# Patient Record
Sex: Male | Born: 1965
Health system: Southern US, Community
[De-identification: ages and names within clinical notes are randomized; demographics above are authoritative.]

## PROBLEM LIST (undated history)

## (undated) ENCOUNTER — Emergency Department (HOSPITAL_COMMUNITY): Disposition: A | Discharge status: Home / Self Care | Payer: Self-pay

## (undated) DIAGNOSIS — J40 Bronchitis, not specified as acute or chronic: Secondary | ICD-10-CM

## (undated) DIAGNOSIS — G43909 Migraine, unspecified, not intractable, without status migrainosus: Secondary | ICD-10-CM

## (undated) DIAGNOSIS — J329 Chronic sinusitis, unspecified: Secondary | ICD-10-CM

## (undated) DIAGNOSIS — T7840XA Allergy, unspecified, initial encounter: Secondary | ICD-10-CM

## (undated) DIAGNOSIS — E119 Type 2 diabetes mellitus without complications: Secondary | ICD-10-CM

## (undated) HISTORY — PX: MOUTH SURGERY: SHX715

## (undated) HISTORY — DX: Allergy, unspecified, initial encounter: T78.40XA

## (undated) HISTORY — PX: NASAL POLYP SURGERY: SHX186

## (undated) HISTORY — DX: Migraine, unspecified, not intractable, without status migrainosus: G43.909

## (undated) HISTORY — DX: Type 2 diabetes mellitus without complications: E11.9

---

## 2012-09-22 ENCOUNTER — Emergency Department (INDEPENDENT_AMBULATORY_CARE_PROVIDER_SITE_OTHER)
Admission: EM | Admit: 2012-09-22 | Discharge: 2012-09-22 | Disposition: A | Discharge status: Home / Self Care | Payer: No Typology Code available for payment source | Source: Home / Self Care | Attending: Emergency Medicine | Admitting: Emergency Medicine

## 2012-09-22 ENCOUNTER — Encounter (HOSPITAL_COMMUNITY): Payer: Self-pay | Admitting: Emergency Medicine

## 2012-09-22 DIAGNOSIS — H6120 Impacted cerumen, unspecified ear: Secondary | ICD-10-CM

## 2012-09-22 DIAGNOSIS — J329 Chronic sinusitis, unspecified: Secondary | ICD-10-CM

## 2012-09-22 DIAGNOSIS — J310 Chronic rhinitis: Secondary | ICD-10-CM

## 2012-09-22 DIAGNOSIS — H6123 Impacted cerumen, bilateral: Secondary | ICD-10-CM

## 2012-09-22 MED ORDER — DOXYCYCLINE HYCLATE 100 MG PO CAPS: 100.0000 mg | ORAL_CAPSULE | Freq: Two times a day (BID) | ORAL | Status: DC

## 2012-09-22 MED ORDER — FEXOFENADINE-PSEUDOEPHED ER 60-120 MG PO TB12: 1.0000 | ORAL_TABLET | Freq: Two times a day (BID) | ORAL | Status: AC

## 2012-09-22 MED ORDER — FLUTICASONE PROPIONATE 50 MCG/ACT NA SUSP: 2.0000 | Freq: Every day | NASAL | Status: DC

## 2012-09-22 MED ORDER — FEXOFENADINE-PSEUDOEPHED ER 60-120 MG PO TB12: 1.0000 | ORAL_TABLET | Freq: Two times a day (BID) | ORAL | Status: DC

## 2012-09-22 NOTE — ED Provider Notes (Signed)
History     CSN: [id number]  Arrival date & time 09/22/12  1435   First MD Initiated Contact with Patient 09/22/12 [phone]      Chief Complaint  Patient presents with  . URI    (Consider location/radiation/quality/duration/timing/severity/associated sxs/prior treatment) HPI Comments: Patient presents to urgent care with ongoing upper respiratory congestion that he describes as pain discomfort pressure, mainly from his left maxillary sinus and left nose. He been having problems to breathe through his left nostril. He continues to have a productive cough and a runny nose. He feels his right ears clogged up. Patient went to another urgent care Center in February and to 10 days of amoxicillin and has been taking Claritin-D he feels that his sinus pressure and discomfort he had at the time improve after the antibiotic course but seemed to have returned. Patient denies any fevers, shortness of breath or difficulty swallowing. Denies any vertigo or tinnitus. Does have the perception of a foul smell from his nose.  Patient is a 47 y.o. male presenting with URI. The history is provided by the patient.  URI Presenting symptoms: congestion, cough, ear pain and rhinorrhea   Presenting symptoms: no fatigue, no fever and no sore throat   Severity:  Moderate Onset quality:  Gradual Duration:  6 weeks Timing:  Constant Chronicity:  Recurrent Relieved by:  Breathing Worsened by:  Nothing tried Associated symptoms: no arthralgias, no headaches, no neck pain, no sinus pain, no sneezing, no swollen glands and no wheezing   Risk factors: recent illness   Risk factors: no immunosuppression, no recent travel and no sick contacts     History reviewed. No pertinent past medical history.  History reviewed. No pertinent past surgical history.  History reviewed. No pertinent family history.  History  Substance Use Topics  . Smoking status: Never Smoker   . Smokeless tobacco: Not on file  . Alcohol Use:  No      Review of Systems  Constitutional: Negative for fever, chills, diaphoresis, activity change, appetite change and fatigue.  HENT: Positive for ear pain, congestion, rhinorrhea and sinus pressure. Negative for hearing loss, sore throat, sneezing, drooling, neck pain, neck stiffness, postnasal drip, tinnitus and ear discharge.   Respiratory: Positive for cough. Negative for chest tightness, shortness of breath, wheezing and stridor.   Musculoskeletal: Negative for arthralgias.  Skin: Negative for color change and rash.  Neurological: Negative for headaches.    Allergies  Review of patient's allergies indicates no known allergies.  Home Medications   Current Outpatient Rx  Name  Route  Sig  Dispense  Refill  . doxycycline (VIBRAMYCIN) 100 MG capsule   Oral   Take 1 capsule (100 mg total) by mouth 2 (two) times daily.   20 capsule   0   . fexofenadine-pseudoephedrine (ALLEGRA-D) 60-120 MG per tablet   Oral   Take 1 tablet by mouth 2 (two) times daily.   30 tablet   0   . fluticasone (FLONASE) 50 MCG/ACT nasal spray   Nasal   Place 2 sprays into the nose daily.   16 g   2     BP 133/71  Pulse 90  Temp(Src) 98.2 F (36.8 C) (Oral)  Resp 20  SpO2 98%  Physical Exam  Vitals reviewed. Constitutional: Vital signs are normal. He appears well-developed and well-nourished.  Non-toxic appearance. He does not have a sickly appearance. He does not appear ill. No distress.  HENT:  Head: Normocephalic.  Right Ear: Ear canal normal.  No drainage, swelling or tenderness. No mastoid tenderness. No decreased hearing is noted.  Left Ear: Ear canal normal. No drainage, swelling or tenderness. No mastoid tenderness. No decreased hearing is noted.  Ears:  Nose: Mucosal edema, rhinorrhea, sinus tenderness and septal deviation present. No nasal deformity or nasal septal hematoma. No epistaxis.  No foreign bodies. Right sinus exhibits no maxillary sinus tenderness and no frontal  sinus tenderness. Left sinus exhibits no maxillary sinus tenderness and no frontal sinus tenderness.    Eyes: Conjunctivae are normal. Right eye exhibits no discharge. Left eye exhibits no discharge. No scleral icterus.  Neck: Neck supple. No JVD present.  Pulmonary/Chest: Effort normal and breath sounds normal.  Abdominal: Soft.  Lymphadenopathy:    He has no cervical adenopathy.  Neurological: He is alert.  Skin: No rash noted. No erythema.    ED Course  Procedures (including critical care time)  Labs Reviewed - No data to display No results found.   1. Rhinitis   2. Cerumen impaction, bilateral   3. Sinusitis nasal       MDM  Problem #1 Seasonal allergies associated left maxillary sinusitis.  Problem #2 left nostril moderate to severe tinnitus cannot fully exclude a nasal polyp or localize infection at this point. Will start patient on both nasal steroid spray, antihistamine with decongestant from Allegra-D told to discontinue ( Claritin-D). In a cycle of doxycycline. Have instructed patient to follow-up with ENT -provider, if no improvement after 7 days          Jimmie Molly, MD 09/22/12 1726

## 2012-09-22 NOTE — ED Notes (Signed)
Pt c/o poss sinus infection onset feb... Reports he went to another Urgent Care in Feb and was given Antibiotics, cough meds Sx today include: productive cough, runny nose, nasal congestion, clogged right ear Denies: f/v/n/d, headache, facial pressure He is alert and oriented w/no signs of acute distress.

## 2012-10-03 ENCOUNTER — Emergency Department (HOSPITAL_COMMUNITY)
Admission: EM | Admit: 2012-10-03 | Discharge: 2012-10-04 | Disposition: A | Discharge status: Home / Self Care | Payer: No Typology Code available for payment source | Attending: Emergency Medicine | Admitting: Emergency Medicine

## 2012-10-03 ENCOUNTER — Emergency Department (HOSPITAL_COMMUNITY): Payer: No Typology Code available for payment source

## 2012-10-03 ENCOUNTER — Encounter (HOSPITAL_COMMUNITY): Payer: Self-pay | Admitting: Emergency Medicine

## 2012-10-03 DIAGNOSIS — R062 Wheezing: Secondary | ICD-10-CM | POA: Insufficient documentation

## 2012-10-03 DIAGNOSIS — Z8719 Personal history of other diseases of the digestive system: Secondary | ICD-10-CM | POA: Insufficient documentation

## 2012-10-03 DIAGNOSIS — R05 Cough: Secondary | ICD-10-CM

## 2012-10-03 HISTORY — DX: Bronchitis, not specified as acute or chronic: J40

## 2012-10-03 MED ORDER — ALBUTEROL SULFATE (5 MG/ML) 0.5% IN NEBU
INHALATION_SOLUTION | RESPIRATORY_TRACT | Status: AC
  Filled 2012-10-03: qty 1

## 2012-10-03 MED ORDER — IPRATROPIUM BROMIDE 0.02 % IN SOLN
0.5000 mg | Freq: Once | RESPIRATORY_TRACT | Status: AC
Start: 2012-10-03 — End: 2012-10-03
  Administered 2012-10-03: 0.5 mg via RESPIRATORY_TRACT
  Filled 2012-10-03: qty 2.5

## 2012-10-03 MED ORDER — ALBUTEROL (5 MG/ML) CONTINUOUS INHALATION SOLN
10.0000 mg/h | INHALATION_SOLUTION | RESPIRATORY_TRACT | Status: AC
  Administered 2012-10-03: 10 mg/h via RESPIRATORY_TRACT
  Filled 2012-10-03: qty 20

## 2012-10-03 MED ORDER — ALBUTEROL SULFATE (5 MG/ML) 0.5% IN NEBU
5.0000 mg | INHALATION_SOLUTION | Freq: Once | RESPIRATORY_TRACT | Status: AC
  Administered 2012-10-03: 5 mg via RESPIRATORY_TRACT
  Filled 2012-10-03: qty 1

## 2012-10-03 MED ORDER — PREDNISONE 20 MG PO TABS
60.0000 mg | ORAL_TABLET | Freq: Once | ORAL | Status: AC
  Administered 2012-10-04: 60 mg via ORAL
  Filled 2012-10-03: qty 3

## 2012-10-03 MED ORDER — IPRATROPIUM BROMIDE 0.02 % IN SOLN
RESPIRATORY_TRACT | Status: AC
  Filled 2012-10-03: qty 2.5

## 2012-10-03 NOTE — ED Notes (Signed)
Pt 90% on RA while ambulating.

## 2012-10-03 NOTE — ED Provider Notes (Signed)
History    This chart was scribed for a non-physician practitioner, Roxy Horseman, PA-C, working with Nelia Shi, MD by Frederik Pear, ED Scribe. This patient was seen in room WTR6/WTR6 and the patient's care was started at 2132.   CSN: [id number]  Arrival date & time 10/03/12  2044   First MD Initiated Contact with Patient 10/03/12 2132      Chief Complaint  Patient presents with  . Cough    (Consider location/radiation/quality/duration/timing/severity/associated sxs/prior treatment) The history is provided by the patient and medical records. No language interpreter was used.   Mark Ward is a 47 y.o. male who presents to the Emergency Department with a chief complaint of gradually worsening, constant, sharp cough with green sputum and chest congestion that has worsened in the last 5 days. In ED, he also complains of SOB.  He reports that he went to [facility] on 05/30 and was diagnosed with rhinitis and sinusitis and discharged with Flonase, Allegra-D, and doxycycline. He states that he finished the course of antibiotics yesterday. He is afebrile. He denies a h/o of asthma or allergies to medications.   Past Medical History  Diagnosis Date  . Bronchitis     Past Surgical History  Procedure Laterality Date  . Mouth surgery      No family history on file.  History  Substance Use Topics  . Smoking status: Never Smoker   . Smokeless tobacco: Not on file  . Alcohol Use: No      Review of Systems A complete 10 system review of systems was obtained and all systems are negative except as noted in the HPI and PMH.   Allergies  Review of patient's allergies indicates no known allergies.  Home Medications   Current Outpatient Rx  Name  Route  Sig  Dispense  Refill  . doxycycline (VIBRAMYCIN) 100 MG capsule   Oral   Take 1 capsule (100 mg total) by mouth 2 (two) times daily.   20 capsule   0   . fexofenadine-pseudoephedrine (ALLEGRA-D) 60-120 MG per tablet   Oral   Take 1 tablet by mouth 2 (two) times daily as needed (allergies).         . fluticasone (FLONASE) 50 MCG/ACT nasal spray   Nasal   Place 2 sprays into the nose daily.   16 g   2   . guaifenesin (ROBITUSSIN) 100 MG/5ML syrup   Oral   Take 200 mg by mouth 3 (three) times daily as needed for cough.           BP 131/83  Pulse 96  Temp(Src) 98.7 F (37.1 C) (Oral)  Resp 20  Wt 202 lb (91.627 kg)  SpO2 93%  Physical Exam  Nursing note and vitals reviewed. Constitutional: He is oriented to person, place, and time. He appears well-developed and well-nourished. No distress.  HENT:  Head: Normocephalic and atraumatic.  Eyes: EOM are normal. Pupils are equal, round, and reactive to light.  Neck: Normal range of motion. Neck supple. No tracheal deviation present.  Cardiovascular: Normal rate.   Pulmonary/Chest: Effort normal. No respiratory distress. He has wheezes. He exhibits no tenderness.  Inspiratory and expiratory wheezes an rhonchi in all fields.  Abdominal: Soft. He exhibits no distension.  Musculoskeletal: Normal range of motion. He exhibits no edema.  Neurological: He is alert and oriented to person, place, and time.  Skin: Skin is warm and dry.  Psychiatric: He has a normal mood and affect. His behavior is normal.  ED Course  Procedures (including critical care time)  DIAGNOSTIC STUDIES: Oxygen Saturation is 93% on room air, adequate by my interpretation.    COORDINATION OF CARE:  21:35- Discussed planned course of treatment with the patient, including a breathing treatment and chest X-ray, who is agreeable at this time.  21:45- Medication Orders- ipratropium (atrovent) nebulizer solution 0.5 mg- once, albuterol (proventil) (5mg /ml) 0.5% nebulizer solution 5 mg- once.  Labs Reviewed - No data to display Dg Chest 2 View  10/03/2012   *RADIOLOGY REPORT*  Clinical Data: Shortness of breath; productive cough.  CHEST - 2 VIEW  Comparison: None.  Findings: The  lungs are well-aerated.  Peribronchial thickening is seen, without definite evidence of focal consolidation.  There is no evidence of pleural effusion or pneumothorax.  The heart is normal in size; the mediastinal contour is within normal limits.  No acute osseous abnormalities are seen.  IMPRESSION: Peribronchial thickening noted; lungs otherwise grossly clear.   Original Report Authenticated By: Tonia Ghent, M.D.     1. Cough   2. Wheezing       MDM  Patient with cough. Considerable wheezing.  Will give nebs and steroids and re-evaluate.  No pneumonia.  Patient still wheezing.  Will give continuous nebs.   Patient ambulates after continuous nebs with O2 sat maintaining >95%.  Discussed the patient with Dr. Norlene Campbell.  Will discharge home with prednisone.  Patient currently taking doxy.   I personally performed the services described in this documentation, which was scribed in my presence. The recorded information has been reviewed and is accurate.          Roxy Horseman, PA-C 10/04/12 [phone]

## 2012-10-03 NOTE — ED Notes (Signed)
Respiratory at bedside.

## 2012-10-03 NOTE — ED Notes (Signed)
Respiratory paged for breathing tx. 

## 2012-10-03 NOTE — ED Notes (Signed)
Patient finished taking doxycycline this morning, still taking his Flonase for an upper respiratory infection.  Patient reports he still has a cough and not feeling well.  Patient reports a scratchy throat also.  Denies fever, nausea, vomiting.

## 2012-10-04 MED ORDER — PREDNISONE 20 MG PO TABS: 40.0000 mg | ORAL_TABLET | Freq: Every day | ORAL | Status: DC

## 2012-10-04 MED ORDER — ALBUTEROL SULFATE HFA 108 (90 BASE) MCG/ACT IN AERS
2.0000 | INHALATION_SPRAY | RESPIRATORY_TRACT | Status: DC | PRN
  Administered 2012-10-04: 2 via RESPIRATORY_TRACT
  Filled 2012-10-04: qty 6.7

## 2012-10-04 NOTE — ED Notes (Signed)
99%-96% on RA while ambulating. PA Browning made aware.

## 2012-10-04 NOTE — ED Provider Notes (Signed)
Medical screening examination/treatment/procedure(s) were performed by non-physician practitioner and as supervising physician I was immediately available for consultation/collaboration.  Nirav Sweda M Ailen Strauch, MD 10/04/12 0609 

## 2012-11-04 ENCOUNTER — Emergency Department (HOSPITAL_COMMUNITY)
Admission: EM | Admit: 2012-11-04 | Discharge: 2012-11-04 | Disposition: A | Discharge status: Home / Self Care | Payer: No Typology Code available for payment source | Attending: Emergency Medicine | Admitting: Emergency Medicine

## 2012-11-04 ENCOUNTER — Emergency Department (HOSPITAL_COMMUNITY): Payer: No Typology Code available for payment source

## 2012-11-04 ENCOUNTER — Encounter (HOSPITAL_COMMUNITY): Payer: Self-pay

## 2012-11-04 DIAGNOSIS — J329 Chronic sinusitis, unspecified: Secondary | ICD-10-CM | POA: Insufficient documentation

## 2012-11-04 DIAGNOSIS — R0982 Postnasal drip: Secondary | ICD-10-CM

## 2012-11-04 DIAGNOSIS — R059 Cough, unspecified: Secondary | ICD-10-CM

## 2012-11-04 DIAGNOSIS — R05 Cough: Secondary | ICD-10-CM | POA: Insufficient documentation

## 2012-11-04 DIAGNOSIS — Z8709 Personal history of other diseases of the respiratory system: Secondary | ICD-10-CM | POA: Insufficient documentation

## 2012-11-04 DIAGNOSIS — R062 Wheezing: Secondary | ICD-10-CM

## 2012-11-04 DIAGNOSIS — R0602 Shortness of breath: Secondary | ICD-10-CM

## 2012-11-04 DIAGNOSIS — J3489 Other specified disorders of nose and nasal sinuses: Secondary | ICD-10-CM | POA: Insufficient documentation

## 2012-11-04 DIAGNOSIS — J309 Allergic rhinitis, unspecified: Secondary | ICD-10-CM

## 2012-11-04 MED ORDER — PREDNISONE 20 MG PO TABS: 40.0000 mg | ORAL_TABLET | Freq: Every day | ORAL | Status: DC

## 2012-11-04 MED ORDER — ALBUTEROL SULFATE (5 MG/ML) 0.5% IN NEBU
2.5000 mg | INHALATION_SOLUTION | Freq: Once | RESPIRATORY_TRACT | Status: AC
  Administered 2012-11-04: 2.5 mg via RESPIRATORY_TRACT
  Filled 2012-11-04: qty 0.5

## 2012-11-04 MED ORDER — ALBUTEROL SULFATE HFA 108 (90 BASE) MCG/ACT IN AERS
2.0000 | INHALATION_SPRAY | Freq: Once | RESPIRATORY_TRACT | Status: AC
  Administered 2012-11-04: 2 via RESPIRATORY_TRACT
  Filled 2012-11-04: qty 6.7

## 2012-11-04 MED ORDER — PREDNISONE 20 MG PO TABS
40.0000 mg | ORAL_TABLET | Freq: Once | ORAL | Status: AC
Start: 2012-11-04 — End: 2012-11-04
  Administered 2012-11-04: 40 mg via ORAL
  Filled 2012-11-04: qty 2

## 2012-11-04 NOTE — ED Provider Notes (Signed)
History    CSN: [id number] Arrival date & time 11/04/12  0756  First MD Initiated Contact with Patient 11/04/12 [phone]     Chief Complaint  Patient presents with  . Cough  . Nasal Congestion   (Consider location/radiation/quality/duration/timing/severity/associated sxs/prior Treatment) HPI  Jadden Yim is a 47 y.o. male complaining of sinusitis lingering for 2 months with productive cough, shortness of breath. Patient denies fever, chest pain, recent immobilization, leg swelling, nausea/vomiting, change in bowel or bladder habits.  Past Medical History  Diagnosis Date  . Bronchitis    Past Surgical History  Procedure Laterality Date  . Mouth surgery     No family history on file. History  Substance Use Topics  . Smoking status: Never Smoker   . Smokeless tobacco: Not on file  . Alcohol Use: No    Review of Systems  Constitutional:       Negative except as described in HPI  HENT:       Negative except as described in HPI  Respiratory:       Negative except as described in HPI  Cardiovascular:       Negative except as described in HPI  Gastrointestinal:       Negative except as described in HPI  Genitourinary:       Negative except as described in HPI  Musculoskeletal:       Negative except as described in HPI  Skin:       Negative except as described in HPI  Neurological:       Negative except as described in HPI  All other systems reviewed and are negative.    Allergies  Review of patient's allergies indicates no known allergies.  Home Medications   Current Outpatient Rx  Name  Route  Sig  Dispense  Refill  . doxycycline (VIBRAMYCIN) 100 MG capsule   Oral   Take 1 capsule (100 mg total) by mouth 2 (two) times daily.   20 capsule   0   . fexofenadine-pseudoephedrine (ALLEGRA-D) 60-120 MG per tablet   Oral   Take 1 tablet by mouth 2 (two) times daily as needed (allergies).         . fluticasone (FLONASE) 50 MCG/ACT nasal spray   Nasal  Place 2 sprays into the nose daily.   16 g   2   . guaifenesin (ROBITUSSIN) 100 MG/5ML syrup   Oral   Take 200 mg by mouth 3 (three) times daily as needed for cough.         . predniSONE (DELTASONE) 20 MG tablet   Oral   Take 2 tablets (40 mg total) by mouth daily.   10 tablet   0    BP 152/80  Pulse 110  Temp(Src) 98.6 F (37 C) (Oral)  Resp 16  SpO2 96% Physical Exam  Nursing note and vitals reviewed. Constitutional: He is oriented to person, place, and time. He appears well-developed and well-nourished. No distress.  HENT:  Head: Normocephalic.  Mouth/Throat: Oropharynx is clear and moist.  Positive rhinorrhea, posterior pharynx is mildly injected. No tenderness to palpation of maxillary or frontal sinuses  Eyes: Conjunctivae and EOM are normal. Pupils are equal, round, and reactive to light.  Neck: Normal range of motion.  Cardiovascular: Normal rate, regular rhythm, normal heart sounds and intact distal pulses.   Pulmonary/Chest: Effort normal. No stridor. No respiratory distress. He has wheezes. He has no rales. He exhibits no tenderness.  No stridor, patient speaking in complete sentences,  no accessory muscle use  Diffuse scattered expiratory wheezing, relation is mildly prolonged.  Abdominal: Soft. Bowel sounds are normal. He exhibits no distension and no mass. There is no tenderness. There is no rebound and no guarding.  Musculoskeletal: Normal range of motion.  Neurological: He is alert and oriented to person, place, and time.  Psychiatric: He has a normal mood and affect.    ED Course  Procedures (including critical care time) Labs Reviewed - No data to display No results found. No diagnosis found.  MDM   Filed Vitals:   11/04/12 0802 11/04/12 0818  BP: 152/80   Pulse: 110   Temp: 98.6 F (37 C)   TempSrc: Oral   Resp: 16   SpO2: 94% 96%     Aizen Duval is a 47 y.o. male with productive cough, wheezing and shortness of breath. Patient has  been seen several times for serial for similar complaints over the last 2 months. Patient wheezing has improved with nebulizer and prednisone. Plain film shows no infiltrate. This is likely a reactive airway set off by his poorly controlled seasonal allergies.  Significant improvement after first nebulizer, patient will be given another nebulizer and sent home with an inhaler. I have encouraged him to consistently use his out of medications, he will go home on prednisone burst. We have discussed the importance of following with primary care. Return precautions to the emergency room or given.  Medications  albuterol (PROVENTIL) (5 MG/ML) 0.5% nebulizer solution 2.5 mg (not administered)  albuterol (PROVENTIL) (5 MG/ML) 0.5% nebulizer solution 2.5 mg (2.5 mg Nebulization Given 11/04/12 0943)  albuterol (PROVENTIL HFA;VENTOLIN HFA) 108 (90 BASE) MCG/ACT inhaler 2 puff (2 puffs Inhalation Given 11/04/12 0947)  predniSONE (DELTASONE) tablet 40 mg (40 mg Oral Given 11/04/12 0924)    Pt is hemodynamically stable, appropriate for, and amenable to discharge at this time. Pt verbalized understanding and agrees with care plan. Outpatient follow-up and specific return precautions discussed.    New Prescriptions   PREDNISONE (DELTASONE) 20 MG TABLET    Take 2 tablets (40 mg total) by mouth daily.     Wynetta Emery, PA-C 11/04/12 1048

## 2012-11-04 NOTE — ED Notes (Signed)
Pt c/o on going sinusitis for past few months, c/o productive cough with yellow sputum, c/o SOB when coughing, pt speaking in complete sentences, no distress noted

## 2012-11-04 NOTE — ED Provider Notes (Signed)
Medical screening examination/treatment/procedure(s) were performed by non-physician practitioner and as supervising physician I was immediately available for consultation/collaboration.  Donnetta Hutching, MD 11/04/12 1239

## 2012-11-24 ENCOUNTER — Observation Stay (HOSPITAL_COMMUNITY)
Admission: EM | Admit: 2012-11-24 | Discharge: 2012-11-26 | Disposition: A | Discharge status: Home / Self Care | Payer: No Typology Code available for payment source | Attending: Internal Medicine | Admitting: Internal Medicine

## 2012-11-24 ENCOUNTER — Encounter (HOSPITAL_COMMUNITY): Payer: Self-pay | Admitting: *Deleted

## 2012-11-24 ENCOUNTER — Emergency Department (HOSPITAL_COMMUNITY): Payer: No Typology Code available for payment source

## 2012-11-24 DIAGNOSIS — J45901 Unspecified asthma with (acute) exacerbation: Principal | ICD-10-CM

## 2012-11-24 DIAGNOSIS — J45909 Unspecified asthma, uncomplicated: Secondary | ICD-10-CM | POA: Diagnosis present

## 2012-11-24 DIAGNOSIS — J4541 Moderate persistent asthma with (acute) exacerbation: Secondary | ICD-10-CM

## 2012-11-24 LAB — POCT I-STAT, CHEM 8
BUN: 12 mg/dL (ref 6–23)
Creatinine, Ser: 1 mg/dL (ref 0.50–1.35)
Hemoglobin: 18 g/dL — ABNORMAL HIGH (ref 13.0–17.0)
Potassium: 3.8 mEq/L (ref 3.5–5.1)
Sodium: 141 mEq/L (ref 135–145)

## 2012-11-24 LAB — URINALYSIS, ROUTINE W REFLEX MICROSCOPIC
Leukocytes, UA: NEGATIVE
Nitrite: NEGATIVE
Protein, ur: NEGATIVE mg/dL
Specific Gravity, Urine: 1.018 (ref 1.005–1.030)
Urobilinogen, UA: 0.2 mg/dL (ref 0.0–1.0)

## 2012-11-24 MED ORDER — FLUTICASONE PROPIONATE HFA 44 MCG/ACT IN AERO
2.0000 | INHALATION_SPRAY | Freq: Two times a day (BID) | RESPIRATORY_TRACT | Status: DC
  Administered 2012-11-24 – 2012-11-26 (×4): 2 via RESPIRATORY_TRACT
  Filled 2012-11-24: qty 10.6

## 2012-11-24 MED ORDER — SODIUM CHLORIDE 0.9 % IJ SOLN
3.0000 mL | Freq: Two times a day (BID) | INTRAMUSCULAR | Status: DC
  Administered 2012-11-24 – 2012-11-26 (×4): 3 mL via INTRAVENOUS

## 2012-11-24 MED ORDER — ALBUTEROL SULFATE (5 MG/ML) 0.5% IN NEBU
2.5000 mg | INHALATION_SOLUTION | Freq: Once | RESPIRATORY_TRACT | Status: AC
  Administered 2012-11-24: 2.5 mg via RESPIRATORY_TRACT
  Filled 2012-11-24: qty 1

## 2012-11-24 MED ORDER — ALBUTEROL SULFATE (5 MG/ML) 0.5% IN NEBU: 2.5000 mg | INHALATION_SOLUTION | RESPIRATORY_TRACT | Status: DC | PRN

## 2012-11-24 MED ORDER — MAGNESIUM SULFATE 40 MG/ML IJ SOLN
2.0000 g | Freq: Once | INTRAMUSCULAR | Status: AC
  Administered 2012-11-24: 2 g via INTRAVENOUS
  Filled 2012-11-24: qty 50

## 2012-11-24 MED ORDER — ALBUTEROL (5 MG/ML) CONTINUOUS INHALATION SOLN
10.0000 mg/h | INHALATION_SOLUTION | Freq: Once | RESPIRATORY_TRACT | Status: AC
  Administered 2012-11-24: 10 mg/h via RESPIRATORY_TRACT
  Filled 2012-11-24: qty 20

## 2012-11-24 MED ORDER — LORATADINE 10 MG PO TABS
10.0000 mg | ORAL_TABLET | Freq: Every day | ORAL | Status: DC
  Administered 2012-11-25 – 2012-11-26 (×2): 10 mg via ORAL
  Filled 2012-11-24 (×2): qty 1

## 2012-11-24 MED ORDER — SODIUM CHLORIDE 0.9 % IV SOLN: 250.0000 mL | INTRAVENOUS | Status: DC | PRN

## 2012-11-24 MED ORDER — SODIUM CHLORIDE 0.9 % IJ SOLN: 3.0000 mL | INTRAMUSCULAR | Status: DC | PRN

## 2012-11-24 MED ORDER — ALBUTEROL SULFATE (5 MG/ML) 0.5% IN NEBU
2.5000 mg | INHALATION_SOLUTION | Freq: Four times a day (QID) | RESPIRATORY_TRACT | Status: DC
  Administered 2012-11-24 – 2012-11-26 (×8): 2.5 mg via RESPIRATORY_TRACT
  Filled 2012-11-24 (×8): qty 0.5

## 2012-11-24 MED ORDER — PREDNISONE 50 MG PO TABS
60.0000 mg | ORAL_TABLET | Freq: Every day | ORAL | Status: DC
  Administered 2012-11-25 – 2012-11-26 (×2): 60 mg via ORAL
  Filled 2012-11-24 (×3): qty 1

## 2012-11-24 MED ORDER — IPRATROPIUM BROMIDE 0.02 % IN SOLN
0.5000 mg | Freq: Four times a day (QID) | RESPIRATORY_TRACT | Status: DC
  Administered 2012-11-24 – 2012-11-26 (×8): 0.5 mg via RESPIRATORY_TRACT
  Filled 2012-11-24 (×8): qty 2.5

## 2012-11-24 MED ORDER — ALBUTEROL SULFATE (5 MG/ML) 0.5% IN NEBU
5.0000 mg | INHALATION_SOLUTION | Freq: Once | RESPIRATORY_TRACT | Status: AC
  Administered 2012-11-24: 5 mg via RESPIRATORY_TRACT
  Filled 2012-11-24: qty 1

## 2012-11-24 MED ORDER — METHYLPREDNISOLONE SODIUM SUCC 125 MG IJ SOLR
125.0000 mg | Freq: Once | INTRAMUSCULAR | Status: AC
  Administered 2012-11-24: 125 mg via INTRAVENOUS
  Filled 2012-11-24: qty 2

## 2012-11-24 MED ORDER — IPRATROPIUM BROMIDE 0.02 % IN SOLN
0.5000 mg | Freq: Once | RESPIRATORY_TRACT | Status: AC
  Administered 2012-11-24: 0.5 mg via RESPIRATORY_TRACT
  Filled 2012-11-24: qty 2.5

## 2012-11-24 NOTE — Progress Notes (Deleted)
Pt has been seen by admission nurse. Pt's nurse needs to complete the remaining information under the admission tab. Briscoe Burns BSN, RN-BC Admissions RN 11/24/2012 4:39 PM

## 2012-11-24 NOTE — ED Provider Notes (Signed)
CSN: [id number]     Arrival date & time 11/24/12  1023 History     First MD Initiated Contact with Patient 11/24/12 1041     No chief complaint on file.  (Consider location/radiation/quality/duration/timing/severity/associated sxs/prior Treatment) HPI Comments: Patient chief complaint of worsening shortness of breath.  He has had intermittent difficulty with wheezing and asthma since February of this year.  Patient states that he has had chronic relapsing upper respiratory infections including rhinorrhea, productive cough which has been chronic since February, and chest tightness with wheezing.  He has no history of asthma.  The patient does have albuterol MDI at home which has not been relieving his symptoms. Denies fevers, chills, myalgias, arthralgias. Denies DOE, SOB, chest tightness or pressure, radiation to left arm, jaw or back, or diaphoresis. Denies dysuria, flank pain, suprapubic pain, frequency, urgency, or hematuria. Denies headaches, light headedness, weakness, visual disturbances. Denies abdominal pain, nausea, vomiting, diarrhea or constipation.    Patient is a 47 y.o. male presenting with shortness of breath. The history is provided by medical records. No language interpreter was used.  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)   Context: not activity, not animal exposure, not emotional upset, not fumes, not known allergens, not pollens, not smoke exposure, not strong odors, not URI and not weather changes   Relieved by:  Nothing Ineffective treatments:  Lying down Associated symptoms: cough, sputum production and wheezing   Associated symptoms: no abdominal pain, no chest pain, no claudication, no diaphoresis, no ear pain, no fever, no headaches, no hemoptysis, no neck pain, no PND, no  rash, no sore throat, no syncope, no swollen glands and no vomiting   Risk factors: obesity   Risk factors: no recent alcohol use, no family hx of DVT, no hx of cancer, no hx of PE/DVT, no prolonged immobilization, no recent surgery and no tobacco use     Past Medical History  Diagnosis Date  . Bronchitis    Past Surgical History  Procedure Laterality Date  . Mouth surgery     No family history on file. History  Substance Use Topics  . Smoking status: Never Smoker   . Smokeless tobacco: Not on file  . Alcohol Use: No    Review of Systems  Constitutional: Negative for fever and diaphoresis.  HENT: Negative for ear pain, sore throat and neck pain.   Respiratory: Positive for cough, sputum production, shortness of breath and wheezing. Negative for hemoptysis.   Cardiovascular: Negative for chest pain, claudication, syncope and PND.  Gastrointestinal: Negative for vomiting and abdominal pain.  Skin: Negative for rash.  Neurological: Negative for headaches.    Allergies  Review of patient's allergies indicates no known allergies.  Home Medications   Current Outpatient Rx  Name  Route  Sig  Dispense  Refill  . guaiFENesin (MUCINEX) 600 MG 12 hr tablet   Oral   Take 600 mg by mouth 2 (two) times daily.         [name] loratadine (CLARITIN) 10 MG tablet   Oral   Take 10 mg by mouth daily.          SpO2 96% Physical Exam  Nursing note and vitals reviewed. Constitutional: He appears well-developed and well-nourished. No distress.  HENT:  Head: Normocephalic and atraumatic.  Eyes: Conjunctivae are normal. No scleral icterus.  Neck: Normal range of motion. Neck supple.  Cardiovascular: Normal rate, regular rhythm and normal heart sounds.   Pulmonary/Chest: Effort normal.  Wheezing heard on exploration all over his lung fields.  He has rhonchi which clear mildly with cough.  Abdominal: Soft. There is no tenderness.  Musculoskeletal: He exhibits no edema.  Neurological: He  is alert.  Skin: Skin is warm and dry. He is not diaphoretic.  Psychiatric: His behavior is normal.    ED Course   Procedures (including critical care time)  Labs Reviewed - No data to display Dg Chest 2 View  11/24/2012   *RADIOLOGY REPORT*  Clinical Data: Shortness of breath, congestion  CHEST - 2 VIEW  Comparison: 11/04/2012  Findings: The heart size and vascular pattern are normal.  The lungs are clear.  No change from prior study.  IMPRESSION: No acute findings   Original Report Authenticated By: Esperanza Heir, M.D.   No diagnosis found.  MDM   Filed Vitals:   11/24/12 1130  SpO2: 96%   Patient here with recurrent tightness, wheezing, difficulty breathing. Patient received 1 albuterol ipratropium neb treatment.  He now has diffuse inspiratory and expiratory wheezing which I believe is secondary to better airflow movement.. Chest x-ray is negative.     1:21 PM Patient given IV Solu-Medrol.  He is awaiting higher acuity room however traffic in the emergency department today is very busy.  The patient has had a second round of 5 mg albuterol  treatment and continues to have diffuse inspiratory expiratory wheezing.  Currently waiting on a respiratory therapist administered hour-long albuterol ipratropium neb treatment.  2:59 PM BP 136/94  Pulse 101  Temp(Src) 98.5 F (36.9 C) (Oral)  Resp 22  SpO2 92% Patient has been on hour-long duo neb.  Has received 2 short acting nebulizer treatments.  His O2 sats dropped to 91% when he is not on oxygen the unaffected the patient has no known history of asthma and is currently having moderate to severe asthma I believe he will need admission for further treatment.  He has also received IV Solu-Medrol.  Patient continues to use accessory muscles.  He has diffuse inspiratory expiratory wheezes.   4:20 PM patient seen in chair visit with Dr. Judd Lien. I have called for admission of the patient however realized that I had not ordered  screening labs on the patient.  Discussed this with Dr. Rana Snare who asked that we try IV magnesium as a last resort intervention for the patien this will occur while patient's lab work is pending.   4:43 PM Patient's labs include chem 8.  Interestingly believe the patient has a polycythemia however he is a nonsmoker and his BUN and creatinine do not reflect volume contraction.   5:41 PM BP 136/86  Pulse 90  Temp(Src) 98.5 F (36.9 C) (Oral)  Resp 20  SpO2 94% Patient received 1mg  IV mag and beginning his second. He continues to have diffuse wheeze. His oxygen saturations appear to have improved. I have spoken with Dr. Cena Benton who will admit the patient for asthma exacerbation.  Arthor Captain, PA-C 11/24/12 1744

## 2012-11-24 NOTE — Progress Notes (Signed)
Patient declined My Chart. Briscoe Burns BSN, RN-BC Admissions RN  11/24/2012 4:41 PM

## 2012-11-24 NOTE — ED Notes (Signed)
Cough, sob, runny nose, for the past 2 days now .

## 2012-11-24 NOTE — H&P (Signed)
[facility] History and Physical  Quintavius Niebuhr WUJ:[id number] DOB: Feb 25, 1966 DOA: 11/24/2012  Referring physician: Dr. Judd Lien PCP: Rogelia Boga, MD  Specialists: none  Chief Complaint: chest tightness and wheezing  HPI: Mark Ward is a 47 y.o. male  With 3 prior ER visits for the CC mentioned above. Patient states that in 1 week he will wheeze during the day or night more than 3 times per week.  He does no use any albuterol or has he ever been formally diagnosed with Asthma he reports. He has had this chest tightness and wheezing for a few days. Given persistent in symptoms and no amelioration of symptoms he presented to the ED for further evaluation and recommendations.  Given Atrovent, albuterol, solumedrol, and magnesium.  Currently feels better.  We were consulted for further admission evaluation and recommendations.  Review of Systems: 10 point review of systems reviewed and negative unless otherwise mentioned above.   Past Medical History  Diagnosis Date  . Bronchitis    Past Surgical History  Procedure Laterality Date  . Mouth surgery     Social History:  reports that he has never smoked. He has never used smokeless tobacco. He reports that he does not drink alcohol or use illicit drugs. Patient lives at home Can participate in ADL's  No Known Allergies  History reviewed. No pertinent family history. None reported when directly asked  Prior to Admission medications   Medication Sig Start Date End Date Taking? Authorizing Provider  guaiFENesin (MUCINEX) 600 MG 12 hr tablet Take 600 mg by mouth 2 (two) times daily.   Yes Historical Provider, MD  loratadine (CLARITIN) 10 MG tablet Take 10 mg by mouth daily.   Yes Historical Provider, MD   Physical Exam: Filed Vitals:   11/24/12 1342 11/24/12 1354 11/24/12 1620 11/24/12 1728  BP: 136/94  140/71 136/86  Pulse: 101  99 90  Temp: 98.5 F (36.9 C)     TempSrc: Oral     Resp: 22  16 20   SpO2: 94% 92%  95% 94%     General:  Pt in NAD, Alert and Awake  Eyes: EOMI, non icteric  ENT: normal exterior appearance, no masses on visual examination  Neck: supple, no goiter  Cardiovascular: RRR no murmurs, no rubs  Respiratory: Expiratory wheezes BL, no increased work of breathing  Abdomen: soft, NT, ND  Skin: warm and dry  Musculoskeletal: no cyanosis or clubbing  Psychiatric: mood and affect appropriate  Neurologic: answers questions appropriately, no focal neurological deficits on exam.  Labs on Admission:  Basic Metabolic Panel:  Recent Labs Lab 11/24/12 1615  NA 141  K 3.8  CL 100  GLUCOSE 167*  BUN 12  CREATININE 1.00   Liver Function Tests: No results found for this basename: AST, ALT, ALKPHOS, BILITOT, PROT, ALBUMIN,  in the last 168 hours No results found for this basename: LIPASE, AMYLASE,  in the last 168 hours No results found for this basename: AMMONIA,  in the last 168 hours CBC:  Recent Labs Lab 11/24/12 1615  HGB 18.0*  HCT 53.0*   Cardiac Enzymes: No results found for this basename: CKTOTAL, CKMB, CKMBINDEX, TROPONINI,  in the last 168 hours  BNP (last 3 results) No results found for this basename: PROBNP,  in the last 8760 hours CBG: No results found for this basename: GLUCAP,  in the last 168 hours  Radiological Exams on Admission: Dg Chest 2 View  11/24/2012   *RADIOLOGY REPORT*  Clinical Data: Shortness of  breath, congestion  CHEST - 2 VIEW  Comparison: 11/04/2012  Findings: The heart size and vascular pattern are normal.  The lungs are clear.  No change from prior study.  IMPRESSION: No acute findings   Original Report Authenticated By: Esperanza Heir, M.D.    Assessment/Plan Active Problems:  1. Moderate persistent asthma with acute exacerbation - I have diagnosed patient with above based on history but patient will require formal PFT's as outpatient. Has appointment this Thursday with PCP - Will add flovent - Prednisone 60 mg po  daily (depending on hospital course would treat for five days on this and then discontinue) - Albuterol and Atrovent q 6 hours - med/surg bed - supplemental oxygen prn.   Code Status: full code Family Communication: Discussed with patient and family member at bedside. Disposition Plan: Likely d/c in 1-2 days  Time spent: > 50 MINUTES  Penny Pia [facility] Pager [phone]  If 7PM-7AM, please contact night-coverage [email or web address] Password [facility] 11/24/2012, 5:52 PM

## 2012-11-24 NOTE — Progress Notes (Signed)
Utilization Review completed.  Tiena Manansala RN CM  

## 2012-11-25 ENCOUNTER — Observation Stay (HOSPITAL_COMMUNITY): Payer: No Typology Code available for payment source

## 2012-11-25 LAB — CBC
Hemoglobin: 16.5 g/dL (ref 13.0–17.0)
MCH: 30.6 pg (ref 26.0–34.0)
MCV: 85.3 fL (ref 78.0–100.0)
RBC: 5.39 MIL/uL (ref 4.22–5.81)

## 2012-11-25 LAB — BASIC METABOLIC PANEL
CO2: 26 mEq/L (ref 19–32)
Calcium: 10 mg/dL (ref 8.4–10.5)
Glucose, Bld: 169 mg/dL — ABNORMAL HIGH (ref 70–99)
Sodium: 136 mEq/L (ref 135–145)

## 2012-11-25 MED ORDER — LEVOFLOXACIN 750 MG PO TABS
750.0000 mg | ORAL_TABLET | Freq: Every day | ORAL | Status: DC
  Administered 2012-11-25 – 2012-11-26 (×2): 750 mg via ORAL
  Filled 2012-11-25 (×2): qty 1

## 2012-11-25 MED ORDER — IOHEXOL 300 MG/ML  SOLN
80.0000 mL | Freq: Once | INTRAMUSCULAR | Status: AC | PRN
  Administered 2012-11-25: 80 mL via INTRAVENOUS

## 2012-11-25 NOTE — Progress Notes (Signed)
[facility] PROGRESS NOTE  Mark Ward RUE:[id number] DOB: 1965-11-08 DOA: 11/24/2012 PCP: Rogelia Boga, MD  Assessment/Plan: 1. Dyspnea/ Chest tightness: This could be secondary to Chronic Bronchitis with  Asthma component. I will continue with nebulizer, prednisone. I will start Levaquin. I will order CT chest to better evaluate. I agree he will need PFT outpatient.   Code Status: Full Code.  Family Communication: CAre discussed with patient.  Disposition Plan: Home when stable.    Consultants:  none  Procedures:  none  Antibiotics:  Levaquin 7-29  HPI/Subjective: Feeling a little better today, some improvement of SOB.   Objective: Filed Vitals:   11/24/12 2029 11/24/12 2200 11/25/12 0600 11/25/12 0820  BP:  136/74 147/74   Pulse:  107 107   Temp:  98.5 F (36.9 C) 98.7 F (37.1 C)   TempSrc:  Oral Oral   Resp:  20 20   Height:      Weight:      SpO2: 94% 93% 95% 97%    Intake/Output Summary (Last 24 hours) at 11/25/12 1036 Last data filed at 11/25/12 0929  Gross per 24 hour  Intake    480 ml  Output   1200 ml  Net   -720 ml   Filed Weights   11/24/12 1856  Weight: 94.8 kg (208 lb 15.9 oz)    Exam:   General:  No distress.   Cardiovascular: S 1, S 2 RRR  Respiratory: bilateral course ronchus, mild wheezes.   Abdomen: BS present, soft, NT, ND  Musculoskeletal: no edema.   Data Reviewed: Basic Metabolic Panel:  Recent Labs Lab 11/24/12 1615 11/25/12 0450  NA 141 136  K 3.8 4.5  CL 100 97  CO2  --  26  GLUCOSE 167* 169*  BUN 12 19  CREATININE 1.00 0.92  CALCIUM  --  10.0   Liver Function Tests: No results found for this basename: AST, ALT, ALKPHOS, BILITOT, PROT, ALBUMIN,  in the last 168 hours No results found for this basename: LIPASE, AMYLASE,  in the last 168 hours No results found for this basename: AMMONIA,  in the last 168 hours CBC:  Recent Labs Lab 11/24/12 1615 11/25/12 0450  WBC  --  13.0*  HGB  18.0* 16.5  HCT 53.0* 46.0  MCV  --  85.3  PLT  --  203   Cardiac Enzymes: No results found for this basename: CKTOTAL, CKMB, CKMBINDEX, TROPONINI,  in the last 168 hours BNP (last 3 results) No results found for this basename: PROBNP,  in the last 8760 hours CBG: No results found for this basename: GLUCAP,  in the last 168 hours  No results found for this or any previous visit (from the past 240 hour(s)).   Studies: Dg Chest 2 View  11/24/2012   *RADIOLOGY REPORT*  Clinical Data: Shortness of breath, congestion  CHEST - 2 VIEW  Comparison: 11/04/2012  Findings: The heart size and vascular pattern are normal.  The lungs are clear.  No change from prior study.  IMPRESSION: No acute findings   Original Report Authenticated By: Esperanza Heir, M.D.    Scheduled Meds: . albuterol  2.5 mg Nebulization Q6H  . fluticasone  2 puff Inhalation BID  . ipratropium  0.5 mg Nebulization Q6H  . levofloxacin  750 mg Oral Daily  . loratadine  10 mg Oral Daily  . predniSONE  60 mg Oral Q breakfast  . sodium chloride  3 mL Intravenous Q12H   Continuous Infusions:  Principal Problem:   Moderate persistent asthma with exacerbation    Time spent: 35 minutes.     REGALADO,BELKYS  [facility] Pager [phone]. If 7PM-7AM, please contact night-coverage at [email or web address], password [facility] 11/25/2012, 10:36 AM  LOS: 1 day

## 2012-11-25 NOTE — Progress Notes (Signed)
Discussed with patient the need to sit in chair or ambulating in room.  Patient speaks without obvious shortness of breath, states he coughed up thick yellowish sputum, not requiring oxygen at this time.

## 2012-11-25 NOTE — Progress Notes (Signed)
Patient has tolerated ambulating in the hallway today, experiencing no problems.  Given po beverage.

## 2012-11-25 NOTE — ED Provider Notes (Signed)
Medical screening examination/treatment/procedure(s) were conducted as a shared visit with non-physician practitioner(s) and myself.  I personally evaluated the patient during the encounter.  Patient presents with complaints of wheezing and shortness of breath.  This has been occurring intermittently for several months.  He has been off and on antibiotics since that time but has never completely cleared.  He denies a history of asthma.  Occasional productive cough but usually is non-productive.    On exam, the oxygen saturations are 90%, but vitals are otherwise stable.  He is alert, oriented, and appears in no distress.  The heart is regular rate and rhythm without murmurs or rubs.  The lungs are noted to have bilateral inspiratory and expiratory wheezes.  There is no stridor and no lower extremity edema.  The patient was given repeat nebulizer treatments, steroids, and magnesium, however continues to wheeze and saturate in the upper 80's, low 90's.  Will discuss admission with the hospitalist.  Geoffery Lyons, MD 11/25/12 1054

## 2012-11-26 MED ORDER — ALBUTEROL SULFATE HFA 108 (90 BASE) MCG/ACT IN AERS: 2.0000 | INHALATION_SPRAY | Freq: Four times a day (QID) | RESPIRATORY_TRACT | Status: DC | PRN

## 2012-11-26 MED ORDER — PREDNISONE 10 MG PO TABS: ORAL_TABLET | ORAL | Status: DC

## 2012-11-26 MED ORDER — FLUTICASONE PROPIONATE HFA 44 MCG/ACT IN AERO: 2.0000 | INHALATION_SPRAY | Freq: Two times a day (BID) | RESPIRATORY_TRACT | Status: DC

## 2012-11-26 MED ORDER — LEVOFLOXACIN 750 MG PO TABS: 750.0000 mg | ORAL_TABLET | Freq: Every day | ORAL | Status: DC

## 2012-11-26 NOTE — Discharge Summary (Signed)
Physician Discharge Summary  Mark Ward ZOX:[id number] DOB: February 24, 1966 DOA: 11/24/2012  PCP: Rogelia Boga, MD  Admit date: 11/24/2012 Discharge date: 11/26/2012  Time spent: 40 minutes  Recommendations for Outpatient Follow-up:  1. Follow up with primary MD.  2. Follow up with pulmonologist, Dr Sandrea Hughs on Wednesday 12/03/12 at 2:45 PM.   Discharge Diagnoses:  Principal Problem:   Moderate persistent asthma with exacerbation   Discharge Condition: Satisfactory.   Diet recommendation: Regular.   Filed Weights   11/24/12 1856  Weight: 94.8 kg (208 lb 15.9 oz)    History of present illness:  47 y.o. Ward with with one-week of progressive SOB/Wheeze. He does not use any Albuterol, nor has he ever been formally diagnosed with Asthma he reports. He has had three recent ED visits for similar issues, given persistent symptoms, he presented to the ED for further evaluation and recommendations. Given Atrovent, Albuterol, Solumedrol, Magnesium, and admitted for further management.   Hospital Course:  Dyspnea/Chest tightness: Patient presented with SOB/Wheeze, of one week's duration, associated with a cough, productive of yellow phlegm, but no fever. CXR was devoid of pneumonic consolidation, and chest CT scan showed no active disease. Patient was recently treated with a nasal spray, for allergic rhinitis. He appears to have had acute bronchitis on this occasion, but as he has had three ED visits for SOB/wheeze in the past one month, and he appears to have underlying obstructive airway disease, although he has no smoking history. Clinically, he responded rather dramatically to bronchodilator nebulizers, steroids and Levaquin. On 11/26/12, he felt much better, and was very keen to be discharged. Outpatient pulmonary follow up has been set up for him on 12/03/12, with Dr Sandrea Hughs, pulmonologist, for evaluation, PFTs and follow up. He will conclude antibiotic therapy on 12/01/12. Oral  steroid taper was prescribed on discharge.    Procedures:  See Below.   Consultations:  N/A.   Discharge Exam: Filed Vitals:   11/25/12 2200 11/26/12 0617 11/26/12 0823 11/26/12 1438  BP: 129/62 109/60  120/58  Pulse: 105 98  95  Temp: 97.7 F (36.5 C) 98.9 F (37.2 C)  97.7 F (36.5 C)  TempSrc: Oral Oral  Oral  Resp: 20 18  18   Height:      Weight:      SpO2: 94% 96% 96% 97%   General: Comfortable, alert, communicative, fully oriented, not short of breath at rest.  HEENT: No clinical pallor, no jaundice, no conjunctival injection or discharge. Hydration is satisfactory.  NECK: Supple, JVP not seen, no carotid bruits, no palpable lymphadenopathy, no palpable goiter.  CHEST: Clinically clear to auscultation, no wheezes, no crackles.  HEART: Sounds 1 and 2 heard, normal, regular, no murmurs.  ABDOMEN: Moderately obese, soft, non-tender, no palpable organomegaly, no palpable masses, normal bowel sounds.  GENITALIA: Not examined.  LOWER EXTREMITIES: No pitting edema, palpable peripheral pulses.  MUSCULOSKELETAL SYSTEM: Unremarkable.  CENTRAL NERVOUS SYSTEM: No focal neurologic deficit on gross examination.  Discharge Instructions      Discharge Orders   Future Appointments Provider Department Dept Phone   11/27/2012 1:15 PM Gordy Savers, MD [facility] [phone]   12/03/2012 2:45 PM [name], MD [facility] [phone]   Future Orders Complete By Expires     Diet general  As directed     Increase activity slowly  As directed         Medication List         albuterol 108 (90  BASE) MCG/ACT inhaler  Commonly known as:  PROVENTIL HFA;VENTOLIN HFA  Inhale 2 puffs into the lungs every 6 (six) hours as needed for wheezing.     fluticasone 44 MCG/ACT inhaler  Commonly known as:  FLOVENT HFA  Inhale 2 puffs into the lungs 2 (two) times daily.     levofloxacin 750 MG tablet  Commonly known as:  LEVAQUIN  Take 1 tablet  (750 mg total) by mouth daily.     loratadine 10 MG tablet  Commonly known as:  CLARITIN  Take 10 mg by mouth daily.     MUCINEX 600 MG 12 hr tablet  Generic drug:  guaiFENesin  Take 600 mg by mouth 2 (two) times daily.     predniSONE 10 MG tablet  Commonly known as:  DELTASONE  Take 40 mg daily for 3 days, then 30 mg daily for 3 days, then 20 mg daily for 3 days, then 10 mg daily for 3 days, then stop.       No Known Allergies Follow-up Information   Schedule an appointment as soon as possible for a visit with Rogelia Boga, MD.   Contact information:   [address] [place] [address] [phone]       Call Sandrea Hughs, MD. (Appointment has been scheduled for wednesday 12/03/12,, at 2:45 PM. )    Contact information:   [address] [place] [address] [phone]        The results of significant diagnostics from this hospitalization (including imaging, microbiology, ancillary and laboratory) are listed below for reference.    Significant Diagnostic Studies: Dg Chest 2 View  11/24/2012   *RADIOLOGY REPORT*  Clinical Data: Shortness of breath, congestion  CHEST - 2 VIEW  Comparison: 11/04/2012  Findings: The heart size and vascular pattern are normal.  The lungs are clear.  No change from prior study.  IMPRESSION: No acute findings   Original Report Authenticated By: Esperanza Heir, M.D.   Dg Chest 2 View  11/04/2012   *RADIOLOGY REPORT*  Clinical Data: Cough, congestion  CHEST - 2 VIEW  Comparison: 10/03/2012  Findings: Cardiomediastinal contours are unchanged, within normal limits. Mild central peribronchial thickening is similar to prior. No consolidation, pleural effusion, or pneumothorax.  No acute osseous finding.  IMPRESSION: Mild central peribronchial thickening is similar to prior.  Otherwise, no radiographic evidence of acute cardiopulmonary process.   Original Report Authenticated By: Jearld Lesch, M.D.   Ct Chest W  Contrast  11/25/2012   *RADIOLOGY REPORT*  Clinical Data: Cough, shortness of breath and wheezing.  CT CHEST WITH CONTRAST  Technique:  Multidetector CT imaging of the chest was performed following the standard protocol during bolus administration of intravenous contrast.  Contrast: 80mL OMNIPAQUE IOHEXOL 300 MG/ML  SOLN  Comparison: Chest x-ray on 11/24/2012.  Findings: The lungs are clear by CT and show no evidence of infiltrates, edema or nodules.  No significant underlying chronic lung disease is identified.  No evidence of bronchiectasis.  No masses or enlarged lymph nodes are seen.  The heart size is normal.  No pleural or pericardial fluid is seen. No visible airway abnormalities.  Bony structures are unremarkable and show no evidence of lesions or fractures.  IMPRESSION: Normal chest CT.   Original Report Authenticated By: Irish Lack, M.D.    Microbiology: No results found for this or any previous visit (from the past 240 hour(s)).   Labs: Basic Metabolic Panel:  Recent Labs Lab 11/24/12 1615 11/25/12 0450  NA 141 136  K 3.8 4.5  CL 100 97  CO2  --  26  GLUCOSE 167* 169*  BUN 12 19  CREATININE 1.00 0.92  CALCIUM  --  10.0   Liver Function Tests: No results found for this basename: AST, ALT, ALKPHOS, BILITOT, PROT, ALBUMIN,  in the last 168 hours No results found for this basename: LIPASE, AMYLASE,  in the last 168 hours No results found for this basename: AMMONIA,  in the last 168 hours CBC:  Recent Labs Lab 11/24/12 1615 11/25/12 0450  WBC  --  13.0*  HGB 18.0* 16.5  HCT 53.0* 46.0  MCV  --  85.3  PLT  --  203   Cardiac Enzymes: No results found for this basename: CKTOTAL, CKMB, CKMBINDEX, TROPONINI,  in the last 168 hours BNP: BNP (last 3 results) No results found for this basename: PROBNP,  in the last 8760 hours CBG: No results found for this basename: GLUCAP,  in the last 168 hours     Signed:  Abbigael Detlefsen,CHRISTOPHER  [facility] 11/26/2012, 4:30  PM

## 2012-11-26 NOTE — Progress Notes (Addendum)
Patient discharged home with parents, alert and oriented, discharge instructions given, parents and patient verbalize understanding of orders given, My Chart will access at home, patient in stable condition at this time

## 2012-11-27 ENCOUNTER — Ambulatory Visit: Payer: No Typology Code available for payment source | Admitting: Internal Medicine

## 2012-12-03 ENCOUNTER — Encounter: Payer: Self-pay | Admitting: *Deleted

## 2012-12-03 ENCOUNTER — Encounter: Payer: Self-pay | Admitting: Internal Medicine

## 2012-12-03 ENCOUNTER — Ambulatory Visit (INDEPENDENT_AMBULATORY_CARE_PROVIDER_SITE_OTHER): Payer: No Typology Code available for payment source | Admitting: Internal Medicine

## 2012-12-03 VITALS — BP 128/74 | HR 112 | Temp 98.4°F | Ht 67.0 in | Wt 226.0 lb

## 2012-12-03 DIAGNOSIS — J45901 Unspecified asthma with (acute) exacerbation: Secondary | ICD-10-CM

## 2012-12-03 DIAGNOSIS — J4541 Moderate persistent asthma with (acute) exacerbation: Secondary | ICD-10-CM

## 2012-12-03 MED ORDER — MOMETASONE FURO-FORMOTEROL FUM 100-5 MCG/ACT IN AERO: INHALATION_SPRAY | RESPIRATORY_TRACT | Status: DC

## 2012-12-03 NOTE — Progress Notes (Signed)
Subjective:     Patient ID: Mark Ward, male   DOB: 11-19-1965   MRN: [id number]  HPI   42 yobm never smoker never sign allergies or asthma with new onset cough Feb 2014 variably improved then relapsed seen by ent x 3 by UC x3 2, x ER x 3 and finally admitted:  Admit date: 11/24/2012  Discharge date: 11/26/2012  Time spent: 40 minutes  Recommendations for Outpatient Follow-up:  1. Follow up with primary MD.  2. Follow up with pulmonologist, Dr Sandrea Hughs on Wednesday 12/03/12 at 2:45 PM.  Discharge Diagnoses:  Principal Problem:  Moderate persistent asthma with exacerbation  Discharge Condition: Satisfactory.  Diet recommendation: Regular.  Filed Weights    11/24/12 1856   Weight:  94.8 kg (208 lb 15.9 oz)   History of present illness:  47 y.o. male with with one-week of progressive SOB/Wheeze. He does not use any Albuterol, nor has he ever been formally diagnosed with Asthma he reports. He has had three recent ED visits for similar issues, given persistent symptoms, he presented to the ED for further evaluation and recommendations. Given Atrovent, Albuterol, Solumedrol, Magnesium, and admitted for further management.  Hospital Course:  Dyspnea/Chest tightness: Patient presented with SOB/Wheeze, of one week's duration, associated with a cough, productive of yellow phlegm, but no fever. CXR was devoid of pneumonic consolidation, and chest CT scan showed no active disease. Patient was recently treated with a nasal spray, for allergic rhinitis. He appears to have had acute bronchitis on this occasion, but as he has had three ED visits for SOB/wheeze in the past one month, and he appears to have underlying obstructive airway disease, although he has no smoking history. Clinically, he responded rather dramatically to bronchodilator nebulizers, steroids and Levaquin. On 11/26/12, he felt much better, and was very keen to be discharged. Outpatient pulmonary follow up has been set up for him on  12/03/12, with Dr Sandrea Hughs, pulmonologist, for evaluation, PFTs and follow up. He will conclude antibiotic therapy on 12/01/12. Oral steroid taper was prescribed on discharge.    12/03/12 1st pulmonary ov/ Mark Ward cc all better but still tapering prednisone.   No obvious daytime variabilty or assoc chronic cough or cp or chest tightness, subjective wheeze overt sinus or hb symptoms. No unusual exp hx or h/o childhood pna/ asthma or knowledge of premature birth.   Sleeping ok without nocturnal  or early am exacerbation  of respiratory  c/o's or need for noct saba. Also denies any obvious fluctuation of symptoms with weather or environmental changes or other aggravating or alleviating factors except as outlined above   Current Medications, Allergies, Past Medical History, Past Surgical History, Family History, and Social History were reviewed in [employer] record.  ROS  The following are not active complaints unless bolded sore throat, dysphagia, dental problems, itching, sneezing,  nasal congestion or excess/ purulent secretions, ear ache,   fever, chills, sweats, unintended wt loss, pleuritic or exertional cp, hemoptysis,  orthopnea pnd or leg swelling, presyncope, palpitations, heartburn, abdominal pain, anorexia, nausea, vomiting, diarrhea  or change in bowel or urinary habits, change in stools or urine, dysuria,hematuria,  rash, arthralgias, visual complaints, headache, numbness weakness or ataxia or problems with walking or coordination,  change in mood/affect or memory.             Review of Systems     Objective:   Physical Exam Pleasant amb bm nad  Wt Readings from Last 3 Encounters:  12/03/12 226  lb (102.513 kg)  11/24/12 208 lb 15.9 oz (94.8 kg)  10/03/12 202 lb (91.627 kg)    HEENT: nl dentition, turbinates, and orophanx. Nl external ear canals without cough reflex   NECK :  without JVD/Nodes/TM/ nl carotid upstrokes bilaterally   LUNGS: no acc  muscle use, clear to A and P bilaterally without cough on insp or exp maneuvers   CV:  RRR  no s3 or murmur or increase in P2, no edema   ABD:  soft and nontender with nl excursion in the supine position. No bruits or organomegaly, bowel sounds nl  MS:  warm without deformities, calf tenderness, cyanosis or clubbing  SKIN: warm and dry without lesions    NEURO:  alert, approp, no deficits    CT chest w/contrast 11/26/10 Nl study      Assessment:

## 2012-12-03 NOTE — Patient Instructions (Addendum)
Dulera 100 Take 2 puffs first thing in am and then another 2 puffs about 12 hours later.     Only use your albuterol (proventil) as a rescue medication to be used if you can't catch your breath by resting or doing a relaxed purse lip breathing pattern. The less you use it, the better it will work when you need it. It's ok to use up 2 puffs every 4 hours if needed but call for immediate appointment if use goes up   Work on inhaler technique:  relax and gently blow all the way out then take a nice smooth deep breath back in, triggering the inhaler at same time you start breathing in.  Hold for up to 5 seconds if you can.  Rinse and gargle with water when done      Please schedule a follow up office visit in 4 weeks, sooner if needed with pft's on return

## 2012-12-05 NOTE — Assessment & Plan Note (Addendum)
DDX of  difficult airways managment all start with A and  include Adherence, Ace Inhibitors, Acid Reflux, Active Sinus Disease, Alpha 1 Antitripsin deficiency, Anxiety masquerading as Airways dz,  ABPA,  allergy(esp in young), Aspiration (esp in elderly), Adverse effects of DPI,  Active smokers, plus two Bs  = Bronchiectasis and Beta blocker use..and one C= CHF    Adherence is the biggest issue and starts with  inability to use HFA effectively and also  understand that SABA treats the symptoms but doesn't get to the underlying problem (inflammation).  I used  the analogy of putting steroid cream on a rash to help explain the meaning of topical therapy and the need to get the drug to the target tissue.    The proper method of use, as well as anticipated side effects, of a metered-dose inhaler are discussed and demonstrated to the patient. Improved effectiveness after extensive coaching during this visit to a level of approximately  75% so start dulera 100 2bid and regroup with pft's next   ? Allergies > defer w/u until determine how difficult his symptoms are to control on a simple/ effective maint rx    Each maintenance medication was reviewed in detail including most importantly the difference between maintenance and as needed and under what circumstances the prns are to be used.  Please see instructions for details which were reviewed in writing and the patient given a copy.

## 2012-12-08 ENCOUNTER — Telehealth: Payer: Self-pay | Admitting: Internal Medicine

## 2012-12-08 NOTE — Telephone Encounter (Signed)
Atc patient-- No answer [id number]

## 2012-12-09 ENCOUNTER — Telehealth: Payer: Self-pay | Admitting: Internal Medicine

## 2012-12-09 NOTE — Telephone Encounter (Signed)
LMTCBx2. Jennifer Castillo, CMA  

## 2012-12-09 NOTE — Telephone Encounter (Signed)
ATC patient, no answer LMOMTCB 

## 2012-12-10 NOTE — Telephone Encounter (Signed)
lmomtcb on pt's home and cell #s 

## 2012-12-10 NOTE — Telephone Encounter (Signed)
Pt returned call

## 2012-12-10 NOTE — Telephone Encounter (Signed)
Pt called. He picked up his RX for dulera from the pharmacy. He stated he left his dulera inhaler MW gave him in the room last visit is why he picked this up.nothing further needed

## 2012-12-10 NOTE — Telephone Encounter (Signed)
LMTCB x 1 

## 2012-12-10 NOTE — Telephone Encounter (Signed)
Duplicate message see phone note 12/09/12

## 2012-12-22 ENCOUNTER — Telehealth: Payer: Self-pay | Admitting: Internal Medicine

## 2012-12-22 MED ORDER — PREDNISONE 10 MG PO TABS: ORAL_TABLET | ORAL | Status: DC

## 2012-12-22 MED ORDER — AMOXICILLIN-POT CLAVULANATE 875-125 MG PO TABS: 1.0000 | ORAL_TABLET | Freq: Two times a day (BID) | ORAL | Status: DC

## 2012-12-22 NOTE — Telephone Encounter (Signed)
LMOMTCB x1 for pt 

## 2012-12-22 NOTE — Telephone Encounter (Signed)
Returning call can be reached at [phone].Mark Ward

## 2012-12-22 NOTE — Telephone Encounter (Signed)
Pt returned triage's call.  Holly D Pryor ° °

## 2012-12-22 NOTE — Telephone Encounter (Signed)
I spoke with pt. He stated he used flonase he got back from [facility] facility for his sinusitis. He used it for 7-8 days. He stated this caused his PND. This is the reason he was in the hospital. Per pt right now also he is having PND, nasal congestion, slight sinus pressure, very little cough w/ yellow phlem x 1 week. He had already stopped the flonase prior to this. Please advise MW thanks  No Known Allergies

## 2012-12-22 NOTE — Telephone Encounter (Signed)
lmomtcb x1 for pt 

## 2012-12-22 NOTE — Telephone Encounter (Signed)
Rx sent and pt is aware. Jackson Coffield, CMA  

## 2012-12-22 NOTE — Telephone Encounter (Signed)
Stop flonasae Prednisone 10 mg take  4 each am x 2 days,   2 each am x 2 days,  1 each am x 2 days and stop If any nasty mucus add augmentin 875 bid x 10 days  Ok to use nasal saline prn stuffy nose and zyrtec prn runny nose if clariton not working   If not doing better we need to see him in office before weekend

## 2012-12-25 ENCOUNTER — Ambulatory Visit (INDEPENDENT_AMBULATORY_CARE_PROVIDER_SITE_OTHER): Payer: No Typology Code available for payment source | Admitting: Internal Medicine

## 2012-12-25 ENCOUNTER — Encounter: Payer: Self-pay | Admitting: Internal Medicine

## 2012-12-25 VITALS — BP 160/80 | HR 100 | Temp 98.0°F | Resp 20 | Ht 67.5 in | Wt 217.0 lb

## 2012-12-25 DIAGNOSIS — J4541 Moderate persistent asthma with (acute) exacerbation: Secondary | ICD-10-CM

## 2012-12-25 DIAGNOSIS — J45901 Unspecified asthma with (acute) exacerbation: Secondary | ICD-10-CM

## 2012-12-25 DIAGNOSIS — Z23 Encounter for immunization: Secondary | ICD-10-CM

## 2012-12-25 DIAGNOSIS — Z0000 Encounter for general adult medical examination without abnormal findings: Secondary | ICD-10-CM

## 2012-12-25 NOTE — Progress Notes (Signed)
  Subjective:    Patient ID: Mark Ward, male    DOB: 1965-10-05, 47 y.o.   MRN: [id number]  HPI  47 year old patient who is seen today to establish with our practice. He is presently is followed by pulmonary medicine do to a new diagnosis of asthma. At the present time he has completed a prednisone taper and antibiotic therapy. He was hospitalized 4 weeks ago for acute exacerbation. He is a lifelong nonsmoker His past medical history is otherwise unremarkable no other prior hospital admissions. He has had wisdom tooth extractions but otherwise no surgery  Social history born in [place], graduated [employer] in 1987 a graduate of [employer] in 1994  Family history is positive or hypertension and diabetes  BP Readings from Last 3 Encounters:  12/25/12 160/80  12/03/12 128/74  11/26/12 120/58    Review of Systems  Constitutional: Negative for fever, chills, appetite change and fatigue.  HENT: Positive for congestion. Negative for hearing loss, ear pain, sore throat, trouble swallowing, neck stiffness, dental problem, voice change and tinnitus.   Eyes: Negative for pain, discharge and visual disturbance.  Respiratory: Positive for shortness of breath and wheezing. Negative for cough, chest tightness and stridor.   Cardiovascular: Negative for chest pain, palpitations and leg swelling.  Gastrointestinal: Negative for nausea, vomiting, abdominal pain, diarrhea, constipation, blood in stool and abdominal distention.  Genitourinary: Negative for urgency, hematuria, flank pain, discharge, difficulty urinating and genital sores.  Musculoskeletal: Negative for myalgias, back pain, joint swelling, arthralgias and gait problem.  Skin: Negative for rash.  Neurological: Negative for dizziness, syncope, speech difficulty, weakness, numbness and headaches.  Hematological: Negative for adenopathy. Does not bruise/bleed easily.  Psychiatric/Behavioral: Negative for behavioral problems and dysphoric  mood. The patient is not nervous/anxious.        Objective:   Physical Exam  Constitutional: He appears well-developed and well-nourished.  Blood pressure 150/95  HENT:  Head: Normocephalic and atraumatic.  Right Ear: External ear normal.  Left Ear: External ear normal.  Nose: Nose normal.  Mouth/Throat: Oropharynx is clear and moist.  Eyes: Conjunctivae and EOM are normal. Pupils are equal, round, and reactive to light. No scleral icterus.  Neck: Normal range of motion. Neck supple. No JVD present. No thyromegaly present.  Cardiovascular: Regular rhythm, normal heart sounds and intact distal pulses.  Exam reveals no gallop and no friction rub.   No murmur heard. Resting tachycardia of 100  Pulmonary/Chest: Effort normal and breath sounds normal. He exhibits no tenderness.  Abdominal: Soft. Bowel sounds are normal. He exhibits no distension and no mass. There is no tenderness.  Genitourinary: Penis normal.  Musculoskeletal: Normal range of motion. He exhibits no edema and no tenderness.  Flat feet  Lymphadenopathy:    He has no cervical adenopathy.  Neurological: He is alert. He has normal reflexes. No cranial nerve deficit. Coordination normal.  Skin: Skin is warm and dry. No rash noted.  Psychiatric: He has a normal mood and affect. His behavior is normal.          Assessment & Plan:    Preventive health exam Asthma exacerbation. Improving. Continue a prednisone taper and pulmonary followup. He is scheduled for PFTs Mild obesity. Weight loss encouraged. He is active at his health club 3-4 times per week we'll continue a regular exercise regimen  Home blood pressure monitoring encouraged

## 2012-12-25 NOTE — Patient Instructions (Signed)
Limit your sodium (Salt) intake    It is important that you exercise regularly, at least 20 minutes 3 to 4 times per week.  If you develop chest pain or shortness of breath seek  medical attention.  You need to lose weight.  Consider a lower calorie diet and regular exercise.  DASH Diet The DASH diet stands for "Dietary Approaches to Stop Hypertension." It is a healthy eating plan that has been shown to reduce high blood pressure (hypertension) in as little as 14 days, while also possibly providing other significant health benefits. These other health benefits include reducing the risk of breast cancer after menopause and reducing the risk of type 2 diabetes, heart disease, colon cancer, and stroke. Health benefits also include weight loss and slowing kidney failure in patients with chronic kidney disease.  DIET GUIDELINES  Limit salt (sodium). Your diet should contain less than 1500 mg of sodium daily.  Limit refined or processed carbohydrates. Your diet should include mostly whole grains. Desserts and added sugars should be used sparingly.  Include small amounts of heart-healthy fats. These types of fats include nuts, oils, and tub margarine. Limit saturated and trans fats. These fats have been shown to be harmful in the body. CHOOSING FOODS  The following food groups are based on a 2000 calorie diet. See your Registered Dietitian for individual calorie needs. Grains and Grain Products (6 to 8 servings daily)  Eat More Often: Whole-wheat bread, brown rice, whole-grain or wheat pasta, quinoa, popcorn without added fat or salt (air popped).  Eat Less Often: White bread, white pasta, white rice, cornbread. Vegetables (4 to 5 servings daily)  Eat More Often: Fresh, frozen, and canned vegetables. Vegetables may be raw, steamed, roasted, or grilled with a minimal amount of fat.  Eat Less Often/Avoid: Creamed or fried vegetables. Vegetables in a cheese sauce. Fruit (4 to 5 servings  daily)  Eat More Often: All fresh, canned (in natural juice), or frozen fruits. Dried fruits without added sugar. One hundred percent fruit juice ( cup [237 mL] daily).  Eat Less Often: Dried fruits with added sugar. Canned fruit in light or heavy syrup. Lean Meats, Fish, and Poultry (2 servings or less daily. One serving is 3 to 4 oz [85-114 g]).  Eat More Often: Ninety percent or leaner ground beef, tenderloin, sirloin. Round cuts of beef, chicken breast, turkey breast. All fish. Grill, bake, or broil your meat. Nothing should be fried.  Eat Less Often/Avoid: Fatty cuts of meat, turkey, or chicken leg, thigh, or wing. Fried cuts of meat or fish. Dairy (2 to 3 servings)  Eat More Often: Low-fat or fat-free milk, low-fat plain or light yogurt, reduced-fat or part-skim cheese.  Eat Less Often/Avoid: Milk (whole, 2%).Whole milk yogurt. Full-fat cheeses. Nuts, Seeds, and Legumes (4 to 5 servings per week)  Eat More Often: All without added salt.  Eat Less Often/Avoid: Salted nuts and seeds, canned beans with added salt. Fats and Sweets (limited)  Eat More Often: Vegetable oils, tub margarines without trans fats, sugar-free gelatin. Mayonnaise and salad dressings.  Eat Less Often/Avoid: Coconut oils, palm oils, butter, stick margarine, cream, half and half, cookies, candy, pie. FOR MORE INFORMATION The Dash Diet Eating Plan: www.dashdiet.org Document Released: 04/05/2011 Document Revised: 07/09/2011 Document Reviewed: 04/05/2011 Exit[employer] Patient Information 2014 [employer].  

## 2013-01-02 ENCOUNTER — Encounter: Payer: Self-pay | Admitting: Internal Medicine

## 2013-01-02 ENCOUNTER — Ambulatory Visit (INDEPENDENT_AMBULATORY_CARE_PROVIDER_SITE_OTHER): Payer: No Typology Code available for payment source | Admitting: Internal Medicine

## 2013-01-02 VITALS — BP 120/88 | HR 109 | Temp 97.3°F | Ht 68.0 in | Wt 212.0 lb

## 2013-01-02 DIAGNOSIS — J45909 Unspecified asthma, uncomplicated: Secondary | ICD-10-CM

## 2013-01-02 DIAGNOSIS — J4540 Moderate persistent asthma, uncomplicated: Secondary | ICD-10-CM

## 2013-01-02 DIAGNOSIS — Z23 Encounter for immunization: Secondary | ICD-10-CM

## 2013-01-02 DIAGNOSIS — J310 Chronic rhinitis: Secondary | ICD-10-CM

## 2013-01-02 DIAGNOSIS — J4541 Moderate persistent asthma with (acute) exacerbation: Secondary | ICD-10-CM

## 2013-01-02 DIAGNOSIS — J45901 Unspecified asthma with (acute) exacerbation: Secondary | ICD-10-CM

## 2013-01-02 LAB — PULMONARY FUNCTION TEST

## 2013-01-02 MED ORDER — AMOXICILLIN-POT CLAVULANATE 875-125 MG PO TABS: 1.0000 | ORAL_TABLET | Freq: Two times a day (BID) | ORAL | Status: DC

## 2013-01-02 NOTE — Progress Notes (Signed)
Pt returned triage's call.  Mark Ward Subjective:     Patient ID: Mark Ward, male   DOB: November 24, 1965   MRN: [id number]  HPI   47 yobm never smoker never sign allergies or asthma with new onset cough Feb 2014 variably improved then relapsed seen by ent x 3 by UC x3 2, x ER x 3 and finally admitted:  Admit date: 11/24/2012  Discharge date: 11/26/2012  Time spent: 40 minutes  Recommendations for Outpatient Follow-up:  1. Follow up with primary MD.  2. Follow up with pulmonologist, Dr Sandrea Hughs on Wednesday 12/03/12 at 2:45 PM.  Discharge Diagnoses:  Principal Problem:  Moderate persistent asthma with exacerbation  Discharge Condition: Satisfactory.  Diet recommendation: Regular.  Filed Weights    11/24/12 1856   Weight:  94.8 kg (208 lb 15.9 oz)   History of present illness:  47 y.o. male with with one-week with one-week of progressive SOB/Wheeze. He does not use any Albuterol, nor has he ever been formally diagnosed with Asthma he reports. He has had three recent ED visits for similar issues, given persistent symptoms, he presented to the ED for further evaluation and recommendations. Given Atrovent, Albuterol, Solumedrol, Magnesium, and admitted for further management.  Hospital Course:  Dyspnea/Chest tightness: Patient presented with SOB/Wheeze, of one week's duration, associated with a cough, productive of yellow phlegm, but no fever. CXR was devoid of pneumonic consolidation, and chest CT scan showed no active disease. Patient was recently treated with a nasal spray, for allergic rhinitis. He appears to have had acute bronchitis on this occasion, but as he has had three ED visits for SOB/wheeze in the past one month, and he appears to have underlying obstructive airway disease, although he has no smoking history. Clinically, he responded rather dramatically to bronchodilator nebulizers, steroids and Levaquin. On 11/26/12, he felt much better, and was very keen to be discharged. Outpatient  pulmonary follow up has been set up for him on 12/03/12, with Dr Sandrea Hughs, pulmonologist, for evaluation, PFTs and follow up. He will conclude antibiotic therapy on 12/01/12. Oral steroid taper was prescribed on discharge.    12/03/12 1st pulmonary ov/ [name] cc all better but still tapering prednisone.  rec Dulera 100 Take 2 puffs first thing in am and then another 2 puffs about 12 hours later.  Only use your albuterol (proventil) as needed  12/22/12 rx augmentin bid x 10 day plus prednisone    01/02/2013 f/u ov/[name]    Chief Complaint  Patient presents with  . Followup with PFT    Pt reports breathing some better since last visit. He still c/o having green nasal d/c.  no  need for saba  No obvious daytime variabilty or assoc chronic cough or cp or chest tightness, subjective wheeze overt sinus or hb symptoms. No unusual exp hx or h/o childhood pna/ asthma or knowledge of premature birth.   Sleeping ok without nocturnal  or early am exacerbation  of respiratory  c/o's or need for noct saba. Also denies any obvious fluctuation of symptoms with weather or environmental changes or other aggravating or alleviating factors except as outlined above   Current Medications, Allergies, Past Medical History, Past Surgical History, Family History, and Social History were reviewed in [employer] [facility] record.  ROS  The following are not active complaints unless bolded sore throat, dysphagia, dental problems, itching, sneezing,  nasal congestion or excess/ purulent secretions, ear ache,   fever, chills, sweats, unintended wt loss, pleuritic or exertional cp, hemoptysis,  orthopnea pnd or leg swelling, presyncope, palpitations, heartburn, abdominal pain, anorexia, nausea, vomiting, diarrhea  or change in bowel or urinary habits, change in stools or urine, dysuria,hematuria,  rash, arthralgias, visual complaints, headache, numbness weakness or ataxia or problems with walking or coordination,   change in mood/affect or memory.        Objective:   Physical Exam Pleasant amb bm nad   01/02/2013          212  Wt Readings from Last 3 Encounters:  12/03/12 226 lb (102.513 kg)  11/24/12 208 lb 15.9 oz (94.8 kg)  10/03/12 202 lb (91.627 kg)    HEENT: nl dentition, turbinates, and orophanx. Nl external ear canals without cough reflex   NECK :  without JVD/Nodes/TM/ nl carotid upstrokes bilaterally   LUNGS: no acc muscle use, clear to A and P bilaterally without cough on insp or exp maneuvers   CV:  RRR  no s3 or murmur or increase in P2, no edema   ABD:  soft and nontender with nl excursion in the supine position. No bruits or organomegaly, bowel sounds nl  MS:  warm without deformities, calf tenderness, cyanosis or clubbing  SKIN: warm and dry without lesions    NEURO:  alert, approp, no deficits    CT chest w/contrast 11/26/10 Nl study      Assessment:

## 2013-01-02 NOTE — Patient Instructions (Addendum)
dulera 100 Take 2 puffs first thing in am and then another 2 puffs about 12 hours later.     Only use your albuterol (proventil)as a rescue medication to be used if you can't catch your breath by resting or doing a relaxed purse lip breathing pattern. The less you use it, the better it will work when you need it. Don't leave home without it  augmentin 875 twice daily x 10 more days and if not completely clear > call Libby at [phone] and schedule sinus CT  Nasal saline as much as you can  Please schedule a follow up office visit in 4 weeks, sooner if needed

## 2013-01-02 NOTE — Progress Notes (Signed)
PFT done today. 

## 2013-01-05 DIAGNOSIS — J310 Chronic rhinitis: Secondary | ICD-10-CM | POA: Insufficient documentation

## 2013-01-05 NOTE — Assessment & Plan Note (Signed)
-   hfa 75% p coaching 12/03/12 > 90% 01/02/2013  - PFT's 01/02/2013 wnl including fef 2575 p am dulera 100 x2 pffs  All goals of chronic asthma control met including nl lung function and elimination of symptoms with minimal need for rescue therapy.  Contingencies discussed in full including contacting this office immediately if not controlling the symptoms using the rule of two's.     No change rx needed

## 2013-01-05 NOTE — Assessment & Plan Note (Signed)
-   Augmentin x 20 days to be complete 01/12/13 > sinus ct if not better  Also advised on use of ns rinses    Each maintenance medication was reviewed in detail including most importantly the difference between maintenance and as needed and under what circumstances the prns are to be used.  Please see instructions for details which were reviewed in writing and the patient given a copy.

## 2013-01-22 ENCOUNTER — Encounter: Payer: Self-pay | Admitting: Internal Medicine

## 2013-02-03 ENCOUNTER — Encounter: Payer: Self-pay | Admitting: Internal Medicine

## 2013-02-03 ENCOUNTER — Ambulatory Visit (INDEPENDENT_AMBULATORY_CARE_PROVIDER_SITE_OTHER): Payer: No Typology Code available for payment source | Admitting: Internal Medicine

## 2013-02-03 VITALS — BP 130/84 | HR 82 | Temp 98.2°F | Ht 67.0 in | Wt 214.0 lb

## 2013-02-03 DIAGNOSIS — J310 Chronic rhinitis: Secondary | ICD-10-CM

## 2013-02-03 DIAGNOSIS — J45909 Unspecified asthma, uncomplicated: Secondary | ICD-10-CM

## 2013-02-03 MED ORDER — ALBUTEROL SULFATE HFA 108 (90 BASE) MCG/ACT IN AERS: 2.0000 | INHALATION_SPRAY | RESPIRATORY_TRACT | Status: DC | PRN

## 2013-02-03 NOTE — Patient Instructions (Addendum)
Plan A = automatic = dulera 100 Take 2 puffs first thing in am and then another 2 puffs about 12 hours later and blow out through the nose   For breathing Plan B = Backup = proaire 2 puffs every 4 hours as needed  For congestion/cough PLan B is mucinex  The max dose is 1200 mg every 12h as neede For Itching sneezing Plan B Clariton 10 mg    Please schedule a follow up visit in 3 months but call sooner if needed

## 2013-02-03 NOTE — Assessment & Plan Note (Signed)
-   hfa 75% p coaching 12/03/12 > 90% 01/02/2013  - PFT's 01/02/2013 wnl including fef 25-75 p am dulera 100 x 2 pffs  All goals of chronic asthma control met including optimal function and elimination of symptoms with minimal need for rescue therapy.  Contingencies discussed in full including contacting this office immediately if not controlling the symptoms using the rule of two's.     If flares again on dulera next step is sinus ct

## 2013-02-03 NOTE — Assessment & Plan Note (Signed)
Adequate control on present rx, reviewed > no change in rx needed  - clariton/ mucinex prn     Each maintenance medication was reviewed in detail including most importantly the difference between maintenance and as needed and under what circumstances the prns are to be used.  Please see instructions for details which were reviewed in writing and the patient given a copy.

## 2013-02-03 NOTE — Progress Notes (Signed)
Pt returned triage's call.  [name] Subjective:     Patient ID: Ahmad Vanwey, male   DOB: 1965/08/23   MRN: [id number]    Brief patient profile:  47 yobm never smoker never sign allergies or asthma with new onset cough Feb 2014 variably improved then relapsed seen by ent x 3 by UC x3 2, x ER x 3 and finally admitted:  Admit date: 11/24/2012  Discharge date: 11/26/2012  Time spent: 40 minutes  Recommendations for Outpatient Follow-up:  1. Follow up with primary MD.  2. Follow up with pulmonologist, Dr Sandrea Hughs on Wednesday 12/03/12 at 2:45 PM.  Discharge Diagnoses:  Principal Problem:  Moderate persistent asthma with exacerbation  Discharge Condition: Satisfactory.  Diet recommendation: Regular.  Filed Weights    11/24/12 1856   Weight:  94.8 kg (208 lb 15.9 oz)   History of present illness:  47 y.o. male with with one-week of progressive SOB/Wheeze. He does not use any Albuterol, nor has he ever been formally diagnosed with Asthma he reports. He has had three recent ED visits for similar issues, given persistent symptoms, he presented to the ED for further evaluation and recommendations. Given Atrovent, Albuterol, Solumedrol, Magnesium, and admitted for further management.  Hospital Course:  Dyspnea/Chest tightness: Patient presented with SOB/Wheeze, of one week's duration, associated with a cough, productive of yellow phlegm, but no fever. CXR was devoid of pneumonic consolidation, and chest CT scan showed no active disease. Patient was recently treated with a nasal spray, for allergic rhinitis. He appears to have had acute bronchitis on this occasion, but as he has had three ED visits for SOB/wheeze in the past one month, and he appears to have underlying obstructive airway disease, although he has no smoking history. Clinically, he responded rather dramatically to bronchodilator nebulizers, steroids and Levaquin. On 11/26/12, he felt much better, and was very keen to be discharged.  Outpatient pulmonary follow up has been set up for him on 12/03/12, with Dr Sandrea Hughs, pulmonologist, for evaluation, PFTs and follow up. He will conclude antibiotic therapy on 12/01/12. Oral steroid taper was prescribed on discharge.    12/03/12 1st pulmonary ov/ Moyinoluwa Dawe cc all better but still tapering prednisone.  rec Dulera 100 Take 2 puffs first thing in am and then another 2 puffs about 12 hours later.  Only use your albuterol (proventil) as needed  12/22/12 rx augmentin bid x 10 day plus prednisone    01/02/2013 f/u ov/Karry Barrilleaux    Chief Complaint  Patient presents with  . Followup with PFT    Pt reports breathing some better since last visit. He still c/o having green nasal d/c.  no  need for saba rec Dulera 100 Take 2 puffs first thing in am and then another 2 puffs about 12 hours later    02/03/2013 f/u ov/Brandell Maready re:  asthma Chief Complaint  Patient presents with  . Follow-up    Breathing is much better. No new co's today. Has not needed proair since the last visit.  No saba at all, no more purulent secretions    No obvious daytime variabilty or assoc chronic cough or cp or chest tightness, subjective wheeze overt sinus or hb symptoms. No unusual exp hx or h/o childhood pna/ asthma or knowledge of premature birth.   Sleeping ok without nocturnal  or early am exacerbation  of respiratory  c/o's or need for noct saba. Also denies any obvious fluctuation of symptoms with weather or environmental changes or other aggravating or  alleviating factors except as outlined above   Current Medications, Allergies, Past Medical History, Past Surgical History, Family History, and Social History were reviewed in [employer] record.  ROS  The following are not active complaints unless bolded sore throat, dysphagia, dental problems, itching, sneezing,  nasal congestion or excess/ purulent secretions, ear ache,   fever, chills, sweats, unintended wt loss, pleuritic or exertional cp,  hemoptysis,  orthopnea pnd or leg swelling, presyncope, palpitations, heartburn, abdominal pain, anorexia, nausea, vomiting, diarrhea  or change in bowel or urinary habits, change in stools or urine, dysuria,hematuria,  rash, arthralgias, visual complaints, headache, numbness weakness or ataxia or problems with walking or coordination,  change in mood/affect or memory.        Objective:   Physical Exam  Pleasant amb bm nad   01/02/2013          212  > 02/03/2013  215  Wt Readings from Last 3 Encounters:  12/03/12 226 lb (102.513 kg)  11/24/12 208 lb 15.9 oz (94.8 kg)  10/03/12 202 lb (91.627 kg)    HEENT: nl dentition, turbinates, and orophanx. Nl external ear canals without cough reflex   NECK :  without JVD/Nodes/TM/ nl carotid upstrokes bilaterally   LUNGS: no acc muscle use, clear to A and P bilaterally without cough on insp or exp maneuvers   CV:  RRR  no s3 or murmur or increase in P2, no edema   ABD:  soft and nontender with nl excursion in the supine position. No bruits or organomegaly, bowel sounds nl  MS:  warm without deformities, calf tenderness, cyanosis or clubbing  SKIN: warm and dry without lesions    NEURO:  alert, approp, no deficits    CT chest w/contrast 11/26/10 Nl study      Assessment:

## 2013-04-03 ENCOUNTER — Telehealth: Payer: Self-pay | Admitting: Internal Medicine

## 2013-04-03 NOTE — Telephone Encounter (Signed)
Lm with the pt letting him know BQ recs since it's so late in the day.

## 2013-04-03 NOTE — Telephone Encounter (Signed)
Take saline rinses/sprays Chlorpheniramine and phenylephrine as needed (over the counter) Call back if no improvement

## 2013-04-03 NOTE — Telephone Encounter (Signed)
Pt returned triage's call.  Holly D Pryor ° °

## 2013-04-03 NOTE — Telephone Encounter (Signed)
lmomtcb  

## 2013-04-03 NOTE — Telephone Encounter (Signed)
lmomtcb x1 for pt 

## 2013-04-03 NOTE — Telephone Encounter (Signed)
Patient returning call.

## 2013-04-03 NOTE — Telephone Encounter (Signed)
lmomtcb x1 

## 2013-04-07 ENCOUNTER — Telehealth: Payer: Self-pay | Admitting: Internal Medicine

## 2013-04-07 MED ORDER — AMOXICILLIN-POT CLAVULANATE 875-125 MG PO TABS: 1.0000 | ORAL_TABLET | Freq: Two times a day (BID) | ORAL | Status: DC

## 2013-04-07 MED ORDER — PREDNISONE 10 MG PO TABS: ORAL_TABLET | ORAL | Status: DC

## 2013-04-07 NOTE — Telephone Encounter (Signed)
Augmentin 875 mg take one pill twice daily  X 10 days - take at breakfast and supper with large glass of water.  It would help reduce the usual side effects (diarrhea and yeast infections) if you ate cultured yogurt at lunch.  Plus Prednisone 10 mg take  4 each am x 2 days,   2 each am x 2 days,  1 each am x 2 days and stop   After 10 days needs sinus CT then ov

## 2013-04-07 NOTE — Telephone Encounter (Signed)
Rx's have been sent in. Pt is aware. 

## 2013-04-07 NOTE — Telephone Encounter (Signed)
I called and spoke with pt. He c/o cough w/ slight green-yellow phlem, PND, nasal congestion, blowing out yellow-green mucus x Friday. He has been taking advil cold and sinus, mucinex, and dm cough syrup. Pt is requesting to have something called in for him. Please advise MW thanks  No Known Allergies

## 2013-04-17 ENCOUNTER — Telehealth: Payer: Self-pay | Admitting: Internal Medicine

## 2013-04-17 DIAGNOSIS — J310 Chronic rhinitis: Secondary | ICD-10-CM

## 2013-04-17 NOTE — Telephone Encounter (Signed)
Spoke with patient's mother; states that patient finished the prednisone and Augmentin Rx Tuesday or Wednesday. Pt continues to cough and have nasal congestion. Cough isn't as bad as it was. Currently using Mucinex. Would like recs from MW what to do.

## 2013-04-17 NOTE — Telephone Encounter (Signed)
mucinex dm was rec at last ov = 1200 mg every 12 hours plus needs sinus ct limited ordered as did not completely clear with pred/ augmentin

## 2013-04-17 NOTE — Telephone Encounter (Signed)
Called and spoke with pts mother and she is aware that the pt will need to cont mucinex 1200 mg  Bid  And i have placed an order for the ct sinus for the pt.  pts mother is aware that we will call her to schedule.

## 2013-04-21 ENCOUNTER — Telehealth: Payer: Self-pay | Admitting: Internal Medicine

## 2013-04-21 NOTE — Telephone Encounter (Signed)
Returning call.Mark Ward ° °

## 2013-04-21 NOTE — Telephone Encounter (Signed)
I spoke with pt. He reports he was returning a call in regards to getting his sinus CT scheduled. Please advise PCC's thanks

## 2013-04-21 NOTE — Telephone Encounter (Signed)
lmtcb x1 

## 2013-05-05 ENCOUNTER — Ambulatory Visit (HOSPITAL_COMMUNITY)
Admission: RE | Admit: 2013-05-05 | Discharge: 2013-05-05 | Disposition: A | Discharge status: Home / Self Care | Payer: BC Managed Care – PPO | Source: Ambulatory Visit | Attending: Internal Medicine | Admitting: Internal Medicine

## 2013-05-05 DIAGNOSIS — J329 Chronic sinusitis, unspecified: Secondary | ICD-10-CM | POA: Insufficient documentation

## 2013-05-05 DIAGNOSIS — J3489 Other specified disorders of nose and nasal sinuses: Secondary | ICD-10-CM | POA: Insufficient documentation

## 2013-05-05 DIAGNOSIS — J310 Chronic rhinitis: Secondary | ICD-10-CM

## 2013-05-06 ENCOUNTER — Encounter: Payer: Self-pay | Admitting: Internal Medicine

## 2013-05-07 NOTE — Progress Notes (Signed)
Quick Note:  LMTCB ______ 

## 2013-05-08 ENCOUNTER — Telehealth: Payer: Self-pay | Admitting: Internal Medicine

## 2013-05-08 DIAGNOSIS — J329 Chronic sinusitis, unspecified: Secondary | ICD-10-CM

## 2013-05-08 NOTE — Telephone Encounter (Signed)
Notes Recorded by Tanda Rockers, MD on 05/06/2013 at 8:32 PM Call patient : Study is c/w severe chronic sinus dz > rec ENT eval rather than additional abx at this point --  lmomtcb x1 for pt

## 2013-05-08 NOTE — Telephone Encounter (Signed)
Pt returning call.Mark Ward ° °

## 2013-05-08 NOTE — Telephone Encounter (Signed)
I spoke with patient about results and he verbalized understanding and had no questions. Referral has been placed

## 2013-05-12 ENCOUNTER — Encounter: Payer: Self-pay | Admitting: Internal Medicine

## 2013-05-12 ENCOUNTER — Ambulatory Visit (INDEPENDENT_AMBULATORY_CARE_PROVIDER_SITE_OTHER): Payer: BC Managed Care – PPO | Admitting: Internal Medicine

## 2013-05-12 VITALS — BP 134/86 | HR 103 | Temp 97.9°F | Ht 67.0 in | Wt 211.2 lb

## 2013-05-12 DIAGNOSIS — J45909 Unspecified asthma, uncomplicated: Secondary | ICD-10-CM

## 2013-05-12 DIAGNOSIS — J310 Chronic rhinitis: Secondary | ICD-10-CM

## 2013-05-12 NOTE — Assessment & Plan Note (Signed)
-   Augmentin x 20 days to be complete 01/12/13  -  Sinus CT 05/05/13 > Complete opacification of visualized portions of the frontal sinuses and ethmoid sinus air cells. No obvious intraorbital or intracranial extension. Moderate mucosal thickening/partial opacification sphenoid sinus air cells (left sphenoid sinus air cell is dominant). Polypoid moderate mucosal thickening maxillary sinuses. - rec ENT eval 05/06/2013> [facility] eval and rec surgery > agree will likely be needed and if not corrected will be very hard to manage his lower airway symptoms

## 2013-05-12 NOTE — Progress Notes (Signed)
Pt returned triage's call.  Mark Ward Subjective:     Patient ID: Mark Ward, male   DOB: 1965-06-30   MRN: [id number]    Brief patient profile:  85 yobm never smoker never sign allergies or asthma with new onset cough Feb 2014 variably improved then relapsed seen by ent x 3 by UC x3 2, x ER x 3 and finally admitted:  Admit date: 11/24/2012  Discharge date: 11/26/2012  Discharge Diagnoses:   Moderate persistent asthma with exacerbation  Discharge Condition: Satisfactory.  Diet recommendation: Regular.  Filed Weights    11/24/12 1856   Weight:  94.8 kg (208 lb 15.9 oz)   History of present illness:  48 y.o. male with with one-week of progressive SOB/Wheeze. He does not use any Albuterol, nor has he ever been formally diagnosed with Asthma he reports. He has had three recent ED visits for similar issues, given persistent symptoms, he presented to the ED for further evaluation and recommendations. Given Atrovent, Albuterol, Solumedrol, Magnesium, and admitted for further management.  Hospital Course:  Dyspnea/Chest tightness: Patient presented with SOB/Wheeze, of one week's duration, associated with a cough, productive of yellow phlegm, but no fever. CXR was devoid of pneumonic consolidation, and chest CT scan showed no active disease. Patient was recently treated with a nasal spray, for allergic rhinitis. He appears to have had acute bronchitis on this occasion, but as he has had three ED visits for SOB/wheeze in the past one month, and he appears to have underlying obstructive airway disease, although he has no smoking history. Clinically, he responded rather dramatically to bronchodilator nebulizers, steroids and Levaquin. On 11/26/12, he felt much better, and was very keen to be discharged. Outpatient pulmonary follow up has been set up for him on 12/03/12, with Dr Mark Ward, pulmonologist, for evaluation, PFTs and follow up. He will conclude antibiotic therapy on 12/01/12. Oral steroid taper  was prescribed on discharge.    12/03/12 1st pulmonary ov/ Mark Ward cc all better but still tapering prednisone.  rec Dulera 100 Take 2 puffs first thing in am and then another 2 puffs about 12 hours later.  Only use your albuterol (proventil) as needed  12/22/12 rx augmentin bid x 10 day plus prednisone    01/02/2013 f/u ov/Mark Ward    Chief Complaint  Patient presents with  . Followup with PFT    Pt reports breathing some better since last visit. He still c/o having green nasal d/c.  no  need for saba rec Dulera 100 Take 2 puffs first thing in am and then another 2 puffs about 12 hours later    02/03/2013 f/u ov/Mark Ward re:  asthma Chief Complaint  Patient presents with  . Follow-up    Breathing is much better. No new co's today. Has not needed proair since the last visit.  No saba at all, no more purulent secretions  rec Plan A = automatic = dulera 100 Take 2 puffs first thing in am and then another 2 puffs about 12 hours later and blow out through the nose   For breathing Plan B = Backup = proaire 2 puffs every 4 hours as needed  For congestion/cough PLan B is mucinex  The max dose is 1200 mg every 12h as needed For Itching sneezing Plan B Clariton 10 mg    05/12/2013 f/u ov/Mark Ward re: asthma in setting of chronic sinusitis Chief Complaint  Patient presents with  . Follow-up    Pt states having prod cough for the past 3-4  wks- yellow sputum. He also c/o yelloe nasal d/c and wheezing. Was seen by ENT today and they rec surgery at this point to correct sinus issues.      Using saba maybe 3-4 x per week - wants phenergan with codeine for cough  .  No obvious daytime variabilty or assoc chronic cough or cp or chest tightness, subjective wheeze overt sinus or hb symptoms. No unusual exp hx or h/o childhood pna/ asthma or knowledge of premature birth.   Sleeping ok without nocturnal  or early am exacerbation  of respiratory  c/o's or need for noct saba. Also denies any obvious fluctuation  of symptoms with weather or environmental changes or other aggravating or alleviating factors except as outlined above   Current Medications, Allergies, Past Medical History, Past Surgical History, Family History, and Social History were reviewed in [employer] record.  ROS  The following are not active complaints unless bolded sore throat, dysphagia, dental problems, itching, sneezing,  nasal congestion or excess/ purulent secretions, ear ache,   fever, chills, sweats, unintended wt loss, pleuritic or exertional cp, hemoptysis,  orthopnea pnd or leg swelling, presyncope, palpitations, heartburn, abdominal pain, anorexia, nausea, vomiting, diarrhea  or change in bowel or urinary habits, change in stools or urine, dysuria,hematuria,  rash, arthralgias, visual complaints, headache, numbness weakness or ataxia or problems with walking or coordination,  change in mood/affect or memory.        Objective:   Physical Exam  Pleasant amb bm nad with very nasal tone to voice  01/02/2013          212  > 02/03/2013  215 > 05/12/2013 211  Wt Readings from Last 3 Encounters:  12/03/12 226 lb (102.513 kg)  11/24/12 208 lb 15.9 oz (94.8 kg)  10/03/12 202 lb (91.627 kg)    HEENT: nl dentition, turbinates, and orophanx. Nl external ear canals without cough reflex   NECK :  without JVD/Nodes/TM/ nl carotid upstrokes bilaterally   LUNGS: no acc muscle use, clear to A and P bilaterally without cough on insp or exp maneuvers   CV:  RRR  no s3 or murmur or increase in P2, no edema   ABD:  soft and nontender with nl excursion in the supine position. No bruits or organomegaly, bowel sounds nl  MS:  warm without deformities, calf tenderness, cyanosis or clubbing  SKIN: warm and dry without lesions         CT chest w/contrast 11/26/10 Nl study      Assessment:

## 2013-05-12 NOTE — Assessment & Plan Note (Signed)
hfa 75% p coaching 12/03/12 > 90% 01/02/2013  - PFT's 01/02/2013 wnl including fef 25-75 p am dulera 100 x2 pffs  Despite poorly controlled rhinitis/ sinusitis > Adequate control on present rx, reviewed > no change in rx needed    Needs to work to correct the sinus problem then return here if not satisfied > if doing better then can just see primary care for f/u and refills

## 2013-05-12 NOTE — Patient Instructions (Addendum)
As long as you have sinus problems you will have some lower resp symptoms as well so I would follow Dr [name] rec for surgery and in meantime:  Plan A = automatic = dulera 100 Take 2 puffs first thing in am and then another 2 puffs about 12 hours later and blow out through the nose   For breathing Plan B = Backup = proaire 2 puffs every 4 hours as needed  For congestion/cough PLan B is mucinex  The max dose is 1200 mg every 12h as neede For Itching sneezing Plan B Clariton 10 mg or Zyrtec    If you are satisfied with your treatment plan let your doctor know and he/she can either refill your medications or you can return here when your prescription runs out.     If in any way you are not 100% satisfied,  please tell us.  If 100% better, tell your friends!

## 2013-06-14 ENCOUNTER — Other Ambulatory Visit: Payer: Self-pay | Admitting: Internal Medicine

## 2013-06-15 ENCOUNTER — Emergency Department (HOSPITAL_COMMUNITY): Payer: BC Managed Care – PPO

## 2013-06-15 ENCOUNTER — Inpatient Hospital Stay (HOSPITAL_COMMUNITY)
Admission: EM | Admit: 2013-06-15 | Discharge: 2013-06-15 | DRG: 203 | Disposition: A | Discharge status: Home / Self Care | Payer: BC Managed Care – PPO | Attending: Internal Medicine | Admitting: Internal Medicine

## 2013-06-15 ENCOUNTER — Encounter (HOSPITAL_COMMUNITY): Payer: Self-pay | Admitting: Emergency Medicine

## 2013-06-15 DIAGNOSIS — Z791 Long term (current) use of non-steroidal anti-inflammatories (NSAID): Secondary | ICD-10-CM

## 2013-06-15 DIAGNOSIS — R000 Tachycardia, unspecified: Secondary | ICD-10-CM | POA: Diagnosis present

## 2013-06-15 DIAGNOSIS — R0902 Hypoxemia: Secondary | ICD-10-CM | POA: Diagnosis present

## 2013-06-15 DIAGNOSIS — J45901 Unspecified asthma with (acute) exacerbation: Principal | ICD-10-CM | POA: Diagnosis present

## 2013-06-15 DIAGNOSIS — Z79899 Other long term (current) drug therapy: Secondary | ICD-10-CM

## 2013-06-15 DIAGNOSIS — J310 Chronic rhinitis: Secondary | ICD-10-CM | POA: Diagnosis present

## 2013-06-15 DIAGNOSIS — J45909 Unspecified asthma, uncomplicated: Secondary | ICD-10-CM | POA: Diagnosis present

## 2013-06-15 HISTORY — DX: Chronic sinusitis, unspecified: J32.9

## 2013-06-15 LAB — COMPREHENSIVE METABOLIC PANEL
ALK PHOS: 124 U/L — AB (ref 39–117)
ALT: 43 U/L (ref 0–53)
AST: 44 U/L — ABNORMAL HIGH (ref 0–37)
Albumin: 4.3 g/dL (ref 3.5–5.2)
BUN: 14 mg/dL (ref 6–23)
CO2: 28 mEq/L (ref 19–32)
Calcium: 9.9 mg/dL (ref 8.4–10.5)
Chloride: 99 mEq/L (ref 96–112)
Creatinine, Ser: 0.94 mg/dL (ref 0.50–1.35)
GLUCOSE: 131 mg/dL — AB (ref 70–99)
Potassium: 4.1 mEq/L (ref 3.7–5.3)
Sodium: 139 mEq/L (ref 137–147)
Total Bilirubin: 0.5 mg/dL (ref 0.3–1.2)
Total Protein: 8.8 g/dL — ABNORMAL HIGH (ref 6.0–8.3)

## 2013-06-15 LAB — CBC WITH DIFFERENTIAL/PLATELET
Basophils Absolute: 0 10*3/uL (ref 0.0–0.1)
Basophils Relative: 0 % (ref 0–1)
Eosinophils Absolute: 0.4 10*3/uL (ref 0.0–0.7)
Eosinophils Relative: 6 % — ABNORMAL HIGH (ref 0–5)
HCT: 45.2 % (ref 39.0–52.0)
HEMOGLOBIN: 16.1 g/dL (ref 13.0–17.0)
LYMPHS ABS: 1.9 10*3/uL (ref 0.7–4.0)
LYMPHS PCT: 26 % (ref 12–46)
MCH: 30.7 pg (ref 26.0–34.0)
MCHC: 35.6 g/dL (ref 30.0–36.0)
MCV: 86.1 fL (ref 78.0–100.0)
MONOS PCT: 11 % (ref 3–12)
Monocytes Absolute: 0.8 10*3/uL (ref 0.1–1.0)
NEUTROS PCT: 56 % (ref 43–77)
Neutro Abs: 4 10*3/uL (ref 1.7–7.7)
Platelets: 201 10*3/uL (ref 150–400)
RBC: 5.25 MIL/uL (ref 4.22–5.81)
RDW: 13.2 % (ref 11.5–15.5)
WBC: 7.2 10*3/uL (ref 4.0–10.5)

## 2013-06-15 LAB — STREP PNEUMONIAE URINARY ANTIGEN: Strep Pneumo Urinary Antigen: NEGATIVE

## 2013-06-15 LAB — LEGIONELLA ANTIGEN, URINE: Legionella Antigen, Urine: NEGATIVE

## 2013-06-15 LAB — INFLUENZA PANEL BY PCR (TYPE A & B)
H1N1FLUPCR: NOT DETECTED
INFLAPCR: NEGATIVE
INFLBPCR: NEGATIVE

## 2013-06-15 MED ORDER — MOMETASONE FURO-FORMOTEROL FUM 100-5 MCG/ACT IN AERO
INHALATION_SPRAY | RESPIRATORY_TRACT | Status: DC
Start: 2013-06-15 — End: 2013-06-16

## 2013-06-15 MED ORDER — METHYLPREDNISOLONE SODIUM SUCC 125 MG IJ SOLR
125.0000 mg | Freq: Once | INTRAMUSCULAR | Status: AC
  Administered 2013-06-15: 125 mg via INTRAVENOUS
  Filled 2013-06-15: qty 2

## 2013-06-15 MED ORDER — ACETAMINOPHEN 325 MG PO TABS
650.0000 mg | ORAL_TABLET | Freq: Four times a day (QID) | ORAL | Status: DC | PRN
  Administered 2013-06-15: 650 mg via ORAL
  Filled 2013-06-15: qty 2

## 2013-06-15 MED ORDER — ALBUTEROL SULFATE (2.5 MG/3ML) 0.083% IN NEBU
INHALATION_SOLUTION | RESPIRATORY_TRACT | Status: AC
  Administered 2013-06-15: 5 mg via RESPIRATORY_TRACT
  Filled 2013-06-15: qty 6

## 2013-06-15 MED ORDER — ALBUTEROL SULFATE (2.5 MG/3ML) 0.083% IN NEBU
5.0000 mg | INHALATION_SOLUTION | Freq: Once | RESPIRATORY_TRACT | Status: AC
  Administered 2013-06-15: 5 mg via RESPIRATORY_TRACT

## 2013-06-15 MED ORDER — ONDANSETRON HCL 4 MG/2ML IJ SOLN: 4.0000 mg | Freq: Four times a day (QID) | INTRAMUSCULAR | Status: DC | PRN

## 2013-06-15 MED ORDER — ALBUTEROL (5 MG/ML) CONTINUOUS INHALATION SOLN
10.0000 mg/h | INHALATION_SOLUTION | RESPIRATORY_TRACT | Status: DC
  Administered 2013-06-15: 10 mg/h via RESPIRATORY_TRACT
  Filled 2013-06-15: qty 20

## 2013-06-15 MED ORDER — MOMETASONE FURO-FORMOTEROL FUM 100-5 MCG/ACT IN AERO
2.0000 | INHALATION_SPRAY | Freq: Two times a day (BID) | RESPIRATORY_TRACT | Status: DC
  Administered 2013-06-15: 2 via RESPIRATORY_TRACT
  Filled 2013-06-15: qty 8.8

## 2013-06-15 MED ORDER — IPRATROPIUM BROMIDE 0.02 % IN SOLN
RESPIRATORY_TRACT | Status: AC
  Administered 2013-06-15: 0.5 mg via RESPIRATORY_TRACT
  Filled 2013-06-15: qty 2.5

## 2013-06-15 MED ORDER — IPRATROPIUM BROMIDE 0.02 % IN SOLN
0.5000 mg | Freq: Once | RESPIRATORY_TRACT | Status: AC
  Administered 2013-06-15: 0.5 mg via RESPIRATORY_TRACT

## 2013-06-15 MED ORDER — IPRATROPIUM BROMIDE 0.02 % IN SOLN
0.5000 mg | RESPIRATORY_TRACT | Status: DC
  Administered 2013-06-15 (×2): 0.5 mg via RESPIRATORY_TRACT
  Filled 2013-06-15 (×3): qty 2.5

## 2013-06-15 MED ORDER — PREDNISONE 50 MG PO TABS: ORAL_TABLET | ORAL | Status: DC

## 2013-06-15 MED ORDER — LEVOFLOXACIN IN D5W 500 MG/100ML IV SOLN
500.0000 mg | INTRAVENOUS | Status: DC
  Administered 2013-06-15: 500 mg via INTRAVENOUS
  Filled 2013-06-15: qty 100

## 2013-06-15 MED ORDER — SODIUM CHLORIDE 0.9 % IJ SOLN: 3.0000 mL | Freq: Two times a day (BID) | INTRAMUSCULAR | Status: DC

## 2013-06-15 MED ORDER — PREDNISONE 50 MG PO TABS
50.0000 mg | ORAL_TABLET | Freq: Once | ORAL | Status: AC
  Administered 2013-06-15: 50 mg via ORAL
  Filled 2013-06-15: qty 1

## 2013-06-15 MED ORDER — PREDNISONE 20 MG PO TABS: 20.0000 mg | ORAL_TABLET | Freq: Every day | ORAL | Status: DC

## 2013-06-15 MED ORDER — METHYLPREDNISOLONE SODIUM SUCC 40 MG IJ SOLR
40.0000 mg | Freq: Two times a day (BID) | INTRAMUSCULAR | Status: DC
  Administered 2013-06-15: 40 mg via INTRAVENOUS
  Filled 2013-06-15 (×2): qty 1

## 2013-06-15 MED ORDER — ALBUTEROL SULFATE (2.5 MG/3ML) 0.083% IN NEBU
2.5000 mg | INHALATION_SOLUTION | RESPIRATORY_TRACT | Status: DC
  Administered 2013-06-15 (×2): 2.5 mg via RESPIRATORY_TRACT
  Filled 2013-06-15 (×3): qty 3

## 2013-06-15 MED ORDER — LEVOFLOXACIN 500 MG PO TABS: 500.0000 mg | ORAL_TABLET | Freq: Every day | ORAL | Status: DC

## 2013-06-15 MED ORDER — ACETAMINOPHEN 650 MG RE SUPP: 650.0000 mg | Freq: Four times a day (QID) | RECTAL | Status: DC | PRN

## 2013-06-15 MED ORDER — ONDANSETRON HCL 4 MG PO TABS
4.0000 mg | ORAL_TABLET | Freq: Four times a day (QID) | ORAL | Status: DC | PRN
Start: 2013-06-15 — End: 2013-06-15

## 2013-06-15 MED ORDER — GUAIFENESIN ER 600 MG PO TB12
600.0000 mg | ORAL_TABLET | Freq: Two times a day (BID) | ORAL | Status: DC
  Administered 2013-06-15: 600 mg via ORAL
  Filled 2013-06-15 (×2): qty 1

## 2013-06-15 MED ORDER — ENOXAPARIN SODIUM 60 MG/0.6ML ~~LOC~~ SOLN
45.0000 mg | SUBCUTANEOUS | Status: DC
  Administered 2013-06-15: 45 mg via SUBCUTANEOUS
  Filled 2013-06-15: qty 0.6

## 2013-06-15 NOTE — ED Provider Notes (Signed)
CSN: [id number]     Arrival date & time 06/15/13  0116 History   First MD Initiated Contact with Patient 06/15/13 0118     Chief Complaint  Patient presents with  . Shortness of Breath     (Consider location/radiation/quality/duration/timing/severity/associated sxs/prior Treatment) HPI Patient with history of asthma-like pulmonary disease followed by pulmonologist presents with 2 days of increasing shortness of breath and productive cough. Sputum is yellow in color. Patient states he ran out of his Ruthe Mannan 2 days ago and has been using his rescue inhaler often with little little relief. Patient denies fevers or chills. He has no lower extremity swelling or pain. Past Medical History  Diagnosis Date  . Bronchitis    Past Surgical History  Procedure Laterality Date  . Mouth surgery     No family history on file. History  Substance Use Topics  . Smoking status: Never Smoker   . Smokeless tobacco: Never Used  . Alcohol Use: No    Review of Systems  Constitutional: Negative for fever and chills.  Respiratory: Positive for cough, shortness of breath and wheezing.   Cardiovascular: Negative for chest pain, palpitations and leg swelling.  Gastrointestinal: Negative for nausea, vomiting and abdominal pain.  Musculoskeletal: Negative for back pain.  Skin: Negative for rash and wound.  Neurological: Negative for dizziness, weakness, light-headedness, numbness and headaches.  All other systems reviewed and are negative.      Allergies  Review of patient's allergies indicates no known allergies.  Home Medications   Current Outpatient Rx  Name  Route  Sig  Dispense  Refill  . albuterol (PROAIR HFA) 108 (90 BASE) MCG/ACT inhaler   Inhalation   Inhale 2 puffs into the lungs every 4 (four) hours as needed for wheezing or shortness of breath.         . B Complex-Biotin-FA (VITAMIN B50 COMPLEX PO)   Oral   Take 1 tablet by mouth daily.         . Cholecalciferol (VITAMIN D3)  400 UNITS CAPS   Oral   Take 1 capsule by mouth daily.         . mometasone-formoterol (DULERA) 100-5 MCG/ACT AERO      Take 2 puffs first thing in am and then another 2 puffs about 12 hours later.   1 Inhaler   11   . Multiple Vitamins-Minerals (CENTRUM) tablet   Oral   Take 1 tablet by mouth daily.         . pseudoephedrine-guaifenesin (MUCINEX D) 60-600 MG per tablet   Oral   Take 1 tablet by mouth every 12 (twelve) hours.         . Pyridoxine HCl (VITAMIN B-6) 250 MG tablet   Oral   Take 250 mg by mouth daily.         [name] UNABLE TO FIND      Med Name: Kyolic tablet twice daily         . vitamin B-12 (CYANOCOBALAMIN) 1000 MCG tablet   Oral   Take 1,000 mcg by mouth daily.         . vitamin C (ASCORBIC ACID) 500 MG tablet   Oral   Take 500 mg by mouth daily.         . vitamin E 400 UNIT capsule   Oral   Take 400 Units by mouth daily.         . Zinc 50 MG CAPS   Oral   Take 1 capsule by mouth  daily.          BP 153/90  Pulse 103  SpO2 88% Physical Exam  Nursing note and vitals reviewed. Constitutional: He is oriented to person, place, and time. He appears well-developed and well-nourished. No distress.  HENT:  Head: Normocephalic and atraumatic.  Mouth/Throat: Oropharynx is clear and moist.  Eyes: EOM are normal. Pupils are equal, round, and reactive to light.  Neck: Normal range of motion. Neck supple.  Cardiovascular: Normal rate and regular rhythm.   Pulmonary/Chest: Effort normal and breath sounds normal. No respiratory distress. He has no wheezes. He has no rales.  Auditory inspiratory and expiratory wheezing. Increased work of breathing and mild tachypnea. Patient is speaking in full sentences  Abdominal: Soft. Bowel sounds are normal. He exhibits no distension and no mass. There is no tenderness. There is no rebound and no guarding.  Musculoskeletal: Normal range of motion. He exhibits no edema and no tenderness.  No calf swelling or  tenderness.  Neurological: He is alert and oriented to person, place, and time.  Moves all extremities without deficit. Sensation grossly intact.  Skin: Skin is warm and dry. No rash noted. No erythema.  Psychiatric: He has a normal mood and affect. His behavior is normal.    ED Course  Procedures (including critical care time) Labs Review Labs Reviewed  CBC WITH DIFFERENTIAL  COMPREHENSIVE METABOLIC PANEL   Imaging Review No results found.  EKG Interpretation   None       MDM   Final diagnoses:  None   Patient with improved wheezing after nebulizer treatment but continues to be short of breath. Chest x-ray without acute findings. Labs appear within normal limits. I discussed with hospitalist and we'll admit for observation.     Julianne Rice, MD 06/15/13 [phone]

## 2013-06-15 NOTE — ED Notes (Signed)
Pt states that he has had ongoing respiratory problems that got worse tonight. Pt has increased WOB. Sats 88%. Taking OTC meds with no relief. Sees Dr. Melvyn Novas. Nasal Polyps.

## 2013-06-15 NOTE — Discharge Instructions (Signed)

## 2013-06-15 NOTE — ED Notes (Signed)
After completion of 1 hour neb pt's O2 sats were 100%.  Attempted to leave pt off of O2, sat's dropped to 90%, pt placed back on 2L [place] w/ an improvement of O2 sat to 95%.

## 2013-06-15 NOTE — Progress Notes (Signed)
Patients oxygen saturation was 95% on room air at rest. Patient ambulated in hallway and oxygen saturation stayed at 95% on room air entire time. HR was 128. MD aware. Will continue to monitor [name]

## 2013-06-15 NOTE — Progress Notes (Signed)
UR completed.  Patient changed to inpatient- requiring IV antibiotics and IV solumedrol.  

## 2013-06-15 NOTE — H&P (Signed)
[facility] History and Physical  Patient: Mark Ward  [id number]  DOB: 1965-05-21  DOS: the patient was seen and examined on 06/15/2013 PCP: [name], MD  Chief Complaint: Cough shortness of breath and wheezing  HPI: Mark Ward is a 48 y.o. male with Past medical history of chronic sinusitis and asthma. The patient is coming from home. The patient presented with complaints of cough and shortness of breath that has been ongoing since last 3 days. He mentions that he has at his baseline chronic cough and chronic sinusitis with nasal congestion since last 3 days his symptoms are worse and he started having shortness of breath which was initially on exertion and now at rest and therefore he came to the hospital. He has extensive workup done for his chronic possible asthma and sinusitis. He is scheduled for an outpatient nasal polyp surgery. He cannot office dulera to 2 days ago and has been using his rescue inhalers. It seems that his worsening of symptoms primarily associated with his work place. Denies any other exposure to chemical, Pets, fumes. No smoking history no family history of lung disease.  Review of Systems: as mentioned in the history of present illness.  A Comprehensive review of the other systems is negative.  Past Medical History  Diagnosis Date  . Bronchitis    Past Surgical History  Procedure Laterality Date  . Mouth surgery     Social History:  reports that he has never smoked. He has never used smokeless tobacco. He reports that he does not drink alcohol or use illicit drugs. Independent for most of his  ADL.  No Known Allergies  No family history on file.  Prior to Admission medications   Medication Sig Start Date End Date Taking? Authorizing Provider  albuterol (PROAIR HFA) 108 (90 BASE) MCG/ACT inhaler Inhale 2 puffs into the lungs every 4 (four) hours as needed for wheezing or shortness of breath. 02/03/13  Yes Tanda Rockers,  MD  B Complex-Biotin-FA (VITAMIN B50 COMPLEX PO) Take 1 tablet by mouth daily.   Yes Historical Provider, MD  Cholecalciferol (VITAMIN D3) 400 UNITS CAPS Take 1 capsule by mouth daily.   Yes Historical Provider, MD  dextromethorphan-guaiFENesin (ROBITUSSIN-DM) 10-100 MG/5ML liquid Take 10 mLs by mouth every 4 (four) hours as needed for cough (cough).   Yes Historical Provider, MD  Flaxseed, Linseed, (FLAX SEED OIL) 1000 MG CAPS Take 1 capsule by mouth daily.   Yes Historical Provider, MD  ibuprofen (ADVIL,MOTRIN) 200 MG tablet Take 400 mg by mouth every 6 (six) hours as needed (headache).   Yes Historical Provider, MD  mometasone-formoterol (DULERA) 100-5 MCG/ACT AERO Take 2 puffs first thing in am and then another 2 puffs about 12 hours later. 12/03/12  Yes Tanda Rockers, MD  Multiple Vitamins-Minerals (CENTRUM) tablet Take 1 tablet by mouth daily.   Yes Historical Provider, MD  Omega-3 Fatty Acids (FISH OIL) 1000 MG CAPS Take 2,000 mg by mouth daily.   Yes Historical Provider, MD  pseudoephedrine (SUDAFED) 30 MG tablet Take 60 mg by mouth every 4 (four) hours as needed for congestion (congestion).   Yes Historical Provider, MD  pseudoephedrine-guaifenesin (MUCINEX D) 60-600 MG per tablet Take 1 tablet by mouth every 12 (twelve) hours.   Yes Historical Provider, MD  Pyridoxine HCl (VITAMIN B-6) 250 MG tablet Take 250 mg by mouth daily.   Yes Historical Provider, MD  UNABLE TO FIND Med Name: Kyolic tablet twice daily   Yes Historical Provider, MD  vitamin C (ASCORBIC ACID) 500 MG tablet Take 500 mg by mouth daily.   Yes Historical Provider, MD  vitamin E 400 UNIT capsule Take 400 Units by mouth daily.   Yes Historical Provider, MD  Zinc 50 MG CAPS Take 1 capsule by mouth daily.   Yes Historical Provider, MD    Physical Exam: Filed Vitals:   06/15/13 0124 06/15/13 0130 06/15/13 0154 06/15/13 0211  BP: 153/90 140/85 159/90   Pulse: 103  112   Temp: 98.7 F (37.1 C)     TempSrc: Oral     SpO2:  95%  99% 97%    General: Alert, Awake and Oriented to Time, Place and Person. Appear in marked distress Eyes: PERRL ENT: Oral Mucosa clear moist. Neck: no JVD Cardiovascular: S1 and S2 Present, no Murmur, Peripheral Pulses Present Respiratory: Bilateral Air entry equal and Decreased, no Crackles,bilateral rhonchi and extensive expiratory wheezes Abdomen: Bowel Sound Present, Soft and Non tender Skin: no Rash Extremities: no Pedal edema, no calf tenderness Neurologic: Grossly Unremarkable.  Labs on Admission:  CBC:  Recent Labs Lab 06/15/13 0128  WBC 7.2  NEUTROABS 4.0  HGB 16.1  HCT 45.2  MCV 86.1  PLT 201    CMP     Component Value Date/Time   NA 139 06/15/2013 0128   K 4.1 06/15/2013 0128   CL 99 06/15/2013 0128   CO2 28 06/15/2013 0128   GLUCOSE 131* 06/15/2013 0128   BUN 14 06/15/2013 0128   CREATININE 0.94 06/15/2013 0128   CALCIUM 9.9 06/15/2013 0128   PROT 8.8* 06/15/2013 0128   ALBUMIN 4.3 06/15/2013 0128   AST 44* 06/15/2013 0128   ALT 43 06/15/2013 0128   ALKPHOS 124* 06/15/2013 0128   BILITOT 0.5 06/15/2013 0128   GFRNONAA >90 06/15/2013 0128   GFRAA >90 06/15/2013 0128    No results found for this basename: LIPASE, AMYLASE,  in the last 168 hours No results found for this basename: AMMONIA,  in the last 168 hours  No results found for this basename: CKTOTAL, CKMB, CKMBINDEX, TROPONINI,  in the last 168 hours BNP (last 3 results) No results found for this basename: PROBNP,  in the last 8760 hours  Radiological Exams on Admission: Dg Chest Port 1 View  06/15/2013   CLINICAL DATA:  Chest pain, short of breath  EXAM: PORTABLE CHEST - 1 VIEW  COMPARISON:  CT CHEST W/CM dated 11/25/2012  FINDINGS: Normal mediastinum and cardiac silhouette. Normal pulmonary vasculature. No evidence of effusion, infiltrate, or pneumothorax. No acute bony abnormality.  IMPRESSION: No acute cardiopulmonary process.   Electronically Signed   By: Suzy Bouchard M.D.   On: 06/15/2013 01:58    Assessment/Plan Principal Problem:   Asthma exacerbation Active Problems:   Chronic rhinitis   1. Asthma exacerbation The patient is presenting with numbness of cough and shortness of breath along with wheezing. He was initially hypoxic on arrival to ED but after treatment his oxygenation is improved and on my evaluation he is on room air but his oxygenation dropped on exertion along with he develops tachycardia and tachypnea on exertion. With this it would be observed in the hospital on telemetry, IV Solu-Medrol, IV antibiotics, influenza PCR sputum cultures and urine antigens, duo nebs every 4 hours and Mucinex.   DVT Prophylaxis: subcutaneous Heparin Nutrition: regular diet  Code Status: full  Family Communication: mother was present at bedside, opportunity was given to ask question and all questions were answered satisfactorily at the time of interview.  Disposition: Admitted to observation in telemetry unit.  Author: Berle Mull, MD [facility] Pager: [phone] 06/15/2013, 4:17 AM    If 7PM-7AM, please contact night-coverage [email or web address] Password [email or web address]

## 2013-06-16 ENCOUNTER — Emergency Department (HOSPITAL_COMMUNITY): Payer: BC Managed Care – PPO

## 2013-06-16 ENCOUNTER — Emergency Department (HOSPITAL_COMMUNITY)
Admission: EM | Admit: 2013-06-16 | Discharge: 2013-06-16 | Disposition: A | Discharge status: Home / Self Care | Payer: BC Managed Care – PPO | Attending: Emergency Medicine | Admitting: Emergency Medicine

## 2013-06-16 ENCOUNTER — Encounter (HOSPITAL_COMMUNITY): Payer: Self-pay | Admitting: Emergency Medicine

## 2013-06-16 DIAGNOSIS — Z79899 Other long term (current) drug therapy: Secondary | ICD-10-CM | POA: Insufficient documentation

## 2013-06-16 DIAGNOSIS — J45909 Unspecified asthma, uncomplicated: Secondary | ICD-10-CM | POA: Insufficient documentation

## 2013-06-16 DIAGNOSIS — R042 Hemoptysis: Secondary | ICD-10-CM | POA: Insufficient documentation

## 2013-06-16 DIAGNOSIS — IMO0002 Reserved for concepts with insufficient information to code with codable children: Secondary | ICD-10-CM | POA: Insufficient documentation

## 2013-06-16 LAB — CBC WITH DIFFERENTIAL/PLATELET
Basophils Absolute: 0 10*3/uL (ref 0.0–0.1)
Basophils Relative: 0 % (ref 0–1)
Eosinophils Absolute: 0.1 10*3/uL (ref 0.0–0.7)
Eosinophils Relative: 0 % (ref 0–5)
HCT: 42.5 % (ref 39.0–52.0)
HEMOGLOBIN: 15 g/dL (ref 13.0–17.0)
Lymphocytes Relative: 11 % — ABNORMAL LOW (ref 12–46)
Lymphs Abs: 2 10*3/uL (ref 0.7–4.0)
MCH: 30.5 pg (ref 26.0–34.0)
MCHC: 35.3 g/dL (ref 30.0–36.0)
MCV: 86.6 fL (ref 78.0–100.0)
MONOS PCT: 6 % (ref 3–12)
Monocytes Absolute: 1.2 10*3/uL — ABNORMAL HIGH (ref 0.1–1.0)
NEUTROS ABS: 15 10*3/uL — AB (ref 1.7–7.7)
NEUTROS PCT: 82 % — AB (ref 43–77)
PLATELETS: 217 10*3/uL (ref 150–400)
RBC: 4.91 MIL/uL (ref 4.22–5.81)
RDW: 13.4 % (ref 11.5–15.5)
WBC: 18.2 10*3/uL — ABNORMAL HIGH (ref 4.0–10.5)

## 2013-06-16 LAB — POCT I-STAT, CHEM 8
BUN: 23 mg/dL (ref 6–23)
CHLORIDE: 100 meq/L (ref 96–112)
Calcium, Ion: 1.16 mmol/L (ref 1.12–1.23)
Creatinine, Ser: 1.1 mg/dL (ref 0.50–1.35)
GLUCOSE: 130 mg/dL — AB (ref 70–99)
HEMATOCRIT: 47 % (ref 39.0–52.0)
Hemoglobin: 16 g/dL (ref 13.0–17.0)
POTASSIUM: 4.1 meq/L (ref 3.7–5.3)
Sodium: 142 mEq/L (ref 137–147)
TCO2: 26 mmol/L (ref 0–100)

## 2013-06-16 NOTE — ED Provider Notes (Signed)
CSN: [id number]     Arrival date & time 06/16/13  1322 History  First MD Initiated Contact with Patient 06/16/13 1423     Chief Complaint  Patient presents with  . Coughing Up Blood    HPI The patient presents to the emergency room with complaints of hemoptysis. Patient was recently in the hospital for an acute asthma exacerbation. He was released yesterday after a one-day stay. Patient states while in the hospital he was given a Lovenox injection maybe some time around 11 AM. Patient thinks that is related to his coughing episode today. This morning prior to arrival in the emergency room, he had several episodes of cough bringing up bright red blood. He has not had any further episodes in the last couple of hours. He denies any shortness of breath. He denies any other bleeding. Denies any vomiting, diarrhea or fever. Past Medical History  Diagnosis Date  . Bronchitis   . Chronic sinusitis    Past Surgical History  Procedure Laterality Date  . Mouth surgery     History reviewed. No pertinent family history. History  Substance Use Topics  . Smoking status: Never Smoker   . Smokeless tobacco: Never Used  . Alcohol Use: No    Review of Systems  All other systems reviewed and are negative.      Allergies  Review of patient's allergies indicates no known allergies.  Home Medications   Current Outpatient Rx  Name  Route  Sig  Dispense  Refill  . albuterol (PROAIR HFA) 108 (90 BASE) MCG/ACT inhaler   Inhalation   Inhale 2 puffs into the lungs every 4 (four) hours as needed for wheezing or shortness of breath.         . B Complex-Biotin-FA (VITAMIN B50 COMPLEX PO)   Oral   Take 1 tablet by mouth daily.         . Cholecalciferol (VITAMIN D3) 400 UNITS CAPS   Oral   Take 1 capsule by mouth daily.         [name] dextromethorphan-guaiFENesin (ROBITUSSIN-DM) 10-100 MG/5ML liquid   Oral   Take 10 mLs by mouth every 4 (four) hours as needed for cough (cough).         .  Flaxseed, Linseed, (FLAX SEED OIL) 1000 MG CAPS   Oral   Take 1 capsule by mouth daily.         [name] ibuprofen (ADVIL,MOTRIN) 200 MG tablet   Oral   Take 400 mg by mouth every 6 (six) hours as needed (headache).         [name] levofloxacin (LEVAQUIN) 500 MG tablet   Oral   Take 1 tablet (500 mg total) by mouth daily.   4 tablet   0   . mometasone-formoterol (DULERA) 100-5 MCG/ACT AERO   Inhalation   Inhale 2 puffs into the lungs 2 (two) times daily. First thing in the morning, then 12 hours later         . Multiple Vitamins-Minerals (CENTRUM) tablet   Oral   Take 1 tablet by mouth daily.         . Omega-3 Fatty Acids (FISH OIL) 1000 MG CAPS   Oral   Take 2,000 mg by mouth daily.         . predniSONE (DELTASONE) 20 MG tablet   Oral   Take 1 tablet (20 mg total) by mouth daily with breakfast. From 06/20/2013 take 2 tablet daily for 3 days then 1 tablet daily for 3  days then 1/2 tablet daily for 4 days.   11 tablet   0   . pseudoephedrine (SUDAFED) 30 MG tablet   Oral   Take 60 mg by mouth every 4 (four) hours as needed for congestion (congestion).         . pseudoephedrine-guaifenesin (MUCINEX D) 60-600 MG per tablet   Oral   Take 1 tablet by mouth every 12 (twelve) hours.         . Pyridoxine HCl (VITAMIN B-6) 250 MG tablet   Oral   Take 250 mg by mouth daily.         [name] UNABLE TO FIND      Med Name: Kyolic tablet twice daily         . vitamin C (ASCORBIC ACID) 500 MG tablet   Oral   Take 500 mg by mouth daily.         . vitamin E 400 UNIT capsule   Oral   Take 400 Units by mouth daily.         . Zinc 50 MG CAPS   Oral   Take 1 capsule by mouth daily.          BP 160/86  Pulse 105  Temp(Src) 97.9 F (36.6 C) (Oral)  Resp 20  SpO2 94% Physical Exam  Nursing note and vitals reviewed. Constitutional: He appears well-developed and well-nourished. No distress.  HENT:  Head: Normocephalic and atraumatic.  Right Ear: External ear normal.   Left Ear: External ear normal.  Eyes: Conjunctivae are normal. Right eye exhibits no discharge. Left eye exhibits no discharge. No scleral icterus.  Neck: Neck supple. No tracheal deviation present.  Cardiovascular: Normal rate, regular rhythm and intact distal pulses.   Pulmonary/Chest: Effort normal and breath sounds normal. No stridor. No respiratory distress. He has no wheezes. He has no rales.  Abdominal: Soft. Bowel sounds are normal. He exhibits no distension. There is no tenderness. There is no rebound and no guarding.  Musculoskeletal: He exhibits no edema and no tenderness.  Neurological: He is alert. He has normal strength. No cranial nerve deficit (no facial droop, extraocular movements intact, no slurred speech) or sensory deficit. He exhibits normal muscle tone. He displays no seizure activity. Coordination normal.  Skin: Skin is warm and dry. No rash noted.  Psychiatric: He has a normal mood and affect.    ED Course  Procedures (including critical care time) Labs Review Labs Reviewed  CBC WITH DIFFERENTIAL - Abnormal; Notable for the following:    WBC 18.2 (*)    Neutrophils Relative % 82 (*)    Neutro Abs 15.0 (*)    Lymphocytes Relative 11 (*)    Monocytes Absolute 1.2 (*)    All other components within normal limits  POCT I-STAT, CHEM 8 - Abnormal; Notable for the following:    Glucose, Bld 130 (*)    All other components within normal limits   Imaging Review Dg Chest 2 View  06/16/2013   CLINICAL DATA:  3-4 weeks of productive cough.  EXAM: CHEST  2 VIEW  COMPARISON:  DG CHEST 1V PORT dated 06/15/2013  FINDINGS: The lungs are adequately inflated. There are new increased near confluent interstitial markings in the infrahilar regions bilaterally. The cardiac silhouette is normal in size. The pulmonary vascularity is not engorged. The mediastinum is normal in width. There is no pleural effusion. The observed portions of the bony thorax are normal.  IMPRESSION: The  findings are consistent with bilateral interstitial  pneumonia. No alveolar infiltrates are demonstrated.   Electronically Signed   By: David  Martinique   On: 06/16/2013 15:05   Dg Chest Port 1 View  06/15/2013   CLINICAL DATA:  Chest pain, short of breath  EXAM: PORTABLE CHEST - 1 VIEW  COMPARISON:  CT CHEST W/CM dated 11/25/2012  FINDINGS: Normal mediastinum and cardiac silhouette. Normal pulmonary vasculature. No evidence of effusion, infiltrate, or pneumothorax. No acute bony abnormality.  IMPRESSION: No acute cardiopulmonary process.   Electronically Signed   By: Suzy Bouchard M.D.   On: 06/15/2013 01:58    EKG Interpretation   None       MDM   Final diagnoses:  Hemoptysis    CXR shows increased interstitial process.  Change form 2/16 however no distress.  NO wheezing.  Already on po abx.  May be viral process.  Still appears stable for outpatient treatment.  Increased WBC however pt is on steroids.  No further hemoptysis.  No episodes of coughing noted during my exam.  Follow up with pulmonologist.    Kathalene Frames, MD 06/16/13 1556

## 2013-06-16 NOTE — Discharge Summary (Signed)
Physician Discharge Summary  Theoden Mauch [id number] DOB: 06-Mar-1966 DOA: 06/15/2013  PCP: [name], MD  Admit date: 06/15/2013 Discharge date: 06/16/2013  Time spent: 35 minutes  Recommendations for Outpatient Follow-up:  1. Follow up with PCP in 1-2 weeks 2. Follow up with ENT as previously scheduled 3. Follow up with Pulmonology in 1-2 weeks   Discharge Diagnoses:  Principal Problem:   Asthma exacerbation Active Problems:   Asthma, chronic   Chronic rhinitis  Discharge Condition: stable  Diet recommendation: regular  Filed Weights   06/15/13 0500  Weight: 94.8 kg (208 lb 15.9 oz)   History of present illness:  Mark Ward is a 48 y.o. male with Past medical history of chronic sinusitis and asthma. The patient is coming from home. The patient presented with complaints of cough and shortness of breath that has been ongoing since last 3 days. He mentions that he has at his baseline chronic cough and chronic sinusitis with nasal congestion since last 3 days his symptoms are worse and he started having shortness of breath which was initially on exertion and now at rest and therefore he came to the hospital.  He has extensive workup done for his chronic possible asthma and sinusitis. He is scheduled for an outpatient nasal polyp surgery. He cannot office dulera to 2 days ago and has been using his rescue inhalers.  It seems that his worsening of symptoms primarily associated with his work place. Denies any other exposure to chemical, Pets, fumes. No smoking history no family history of lung disease.   Hospital Course:  Asthma exacerbation - patient comfortable on room air after ED interventions and after arrival to the floor. His wheezing resolved and was able to ambulate in the hallway without difficulties, on room air, without dyspnea, chest pain, palpitations. He was discharged home in stable condition to complete a Prednisone taper and an antibiotic course as  an outpatient. Influenza PCR was obtained and was negative.   Procedures:  none   Consultations:  none  Discharge Exam: Filed Vitals:   06/15/13 0531 06/15/13 0802 06/15/13 1235 06/15/13 1447  BP: 162/76   139/66  Pulse: 112   115  Temp: 98.3 F (36.8 C)   98.4 F (36.9 C)  TempSrc: Oral   Oral  Resp: 22   20  Height:      Weight:      SpO2: 96% 97% 97% 94%   General: NAD Cardiovascular: RRR Respiratory: CTA biL, good air movement, no wheezing.   Discharge Instructions    Medication List         albuterol 108 (90 BASE) MCG/ACT inhaler  Commonly known as:  PROAIR HFA  Inhale 2 puffs into the lungs every 4 (four) hours as needed for wheezing or shortness of breath.     CENTRUM tablet  Take 1 tablet by mouth daily.     dextromethorphan-guaiFENesin 10-100 MG/5ML liquid  Commonly known as:  ROBITUSSIN-DM  Take 10 mLs by mouth every 4 (four) hours as needed for cough (cough).     Fish Oil 1000 MG Caps  Take 2,000 mg by mouth daily.     Flax Seed Oil 1000 MG Caps  Take 1 capsule by mouth daily.     ibuprofen 200 MG tablet  Commonly known as:  ADVIL,MOTRIN  Take 400 mg by mouth every 6 (six) hours as needed (headache).     levofloxacin 500 MG tablet  Commonly known as:  LEVAQUIN  Take 1 tablet (500 mg  total) by mouth daily.     mometasone-formoterol 100-5 MCG/ACT Aero  Commonly known as:  DULERA  TAKE 2 PUFFS FIRST THING IN AM AND THEN ANOTHER 2 PUFFS 12 HOURS LATER     predniSONE 50 MG tablet  Commonly known as:  DELTASONE  Take 1 tablet daily for 4 days 06/16/2013 until 06/19/2013     predniSONE 20 MG tablet  Commonly known as:  DELTASONE  Take 1 tablet (20 mg total) by mouth daily with breakfast. From 06/20/2013 take 2 tablet daily for 3 days then 1 tablet daily for 3 days then 1/2 tablet daily for 4 days.     pseudoephedrine 30 MG tablet  Commonly known as:  SUDAFED  Take 60 mg by mouth every 4 (four) hours as needed for congestion (congestion).      pseudoephedrine-guaifenesin 60-600 MG per tablet  Commonly known as:  MUCINEX D  Take 1 tablet by mouth every 12 (twelve) hours.     UNABLE TO FIND  Med Name: Kyolic tablet twice daily     vitamin B-6 250 MG tablet  Take 250 mg by mouth daily.     VITAMIN B50 COMPLEX PO  Take 1 tablet by mouth daily.     vitamin C 500 MG tablet  Commonly known as:  ASCORBIC ACID  Take 500 mg by mouth daily.     Vitamin D3 400 UNITS Caps  Take 1 capsule by mouth daily.     vitamin E 400 UNIT capsule  Take 400 Units by mouth daily.     Zinc 50 MG Caps  Take 1 capsule by mouth daily.           Follow-up Information   Follow up with [name], MD. Schedule an appointment as soon as possible for a visit in 1 week.   Specialty:  Internal Medicine   Contact information:   [place] [address] [phone]       Follow up with Christinia Gully, MD. Schedule an appointment as soon as possible for a visit in 2 weeks.   Specialty:  Pulmonary Disease   Contact information:   [address] [place] [address] [phone]       Follow up with Melissa Montane, MD. Schedule an appointment as soon as possible for a visit in 2 weeks.   Specialty:  Otolaryngology   Contact information:   [address] [place] [address] [phone]       The results of significant diagnostics from this hospitalization (including imaging, microbiology, ancillary and laboratory) are listed below for reference.    Significant Diagnostic Studies: Dg Chest Port 1 View  06/15/2013   CLINICAL DATA:  Chest pain, short of breath  EXAM: PORTABLE CHEST - 1 VIEW  COMPARISON:  CT CHEST W/CM dated 11/25/2012  FINDINGS: Normal mediastinum and cardiac silhouette. Normal pulmonary vasculature. No evidence of effusion, infiltrate, or pneumothorax. No acute bony abnormality.  IMPRESSION: No acute cardiopulmonary process.   Electronically Signed   By: Suzy Bouchard M.D.   On:  06/15/2013 01:58   Labs: Basic Metabolic Panel:  Recent Labs Lab 06/15/13 0128  NA 139  K 4.1  CL 99  CO2 28  GLUCOSE 131*  BUN 14  CREATININE 0.94  CALCIUM 9.9   Liver Function Tests:  Recent Labs Lab 06/15/13 0128  AST 44*  ALT 43  ALKPHOS 124*  BILITOT 0.5  PROT 8.8*  ALBUMIN 4.3   CBC:  Recent  Labs Lab 06/15/13 0128  WBC 7.2  NEUTROABS 4.0  HGB 16.1  HCT 45.2  MCV 86.1  PLT 201    Signed:  Mahogany Torrance  [facility] 06/16/2013, 11:33 AM

## 2013-06-16 NOTE — Discharge Instructions (Signed)
Hemoptysis  Hemoptysis, which means coughing up blood, can be a sign of a minor problem or a serious medical condition. The blood that is coughed up may come from the lungs and airways. Coughed-up blood can also come from bleeding that occurs outside the lungs and airways. Blood can drain into the windpipe during a severe nosebleed or when blood is vomited from the stomach. Because hemoptysis can be a sign of something serious, a medical evaluation is required. For some people with hemoptysis, no definite cause is ever identified.  CAUSES   The most common cause of hemoptysis is bronchitis. Some other common causes include:   · A ruptured blood vessel caused by coughing or an infection.    · A medical condition that causes damage to the large air passageways (bronchiectasis).    · A blood clot in the lungs (pulmonary embolism).    · Pneumonia.    · Tuberculosis.    · Breathing in a small foreign object.    · Cancer.  For some people with hemoptysis, no definite cause is ever identified.    HOME CARE INSTRUCTIONS  · Only take over-the-counter or prescription medicines as directed by your caregiver. Do not use cough suppressants unless your caregiver approves.  · If your caregiver prescribes antibiotic medicines, take them as directed. Finish them even if you start to feel better.  · Do not smoke. Also avoid secondhand smoke.  · Follow up with your caregiver as directed.  SEEK IMMEDIATE MEDICAL CARE IF:   · You cough up bloody mucus for longer than a week.  · You have a blood-producing cough that is severe or getting worse.  · You have a blood-producing cough that comes and goes over time.  · You develop problems with your breathing.    · You vomit blood.  · You develop bloody or black-colored stools.  · You have chest pain.    · You develop night sweats.  · You feel faint or pass out.    · You have a fever or persistent symptoms for more than 2 3 days.    · You have a fever and your symptoms suddenly get worse.  MAKE  SURE YOU:  · Understand these instructions.  · Will watch your condition.  · Will get help right away if you are not doing well or get worse.  Document Released: 06/25/2001 Document Revised: 04/02/2012 Document Reviewed: 02/01/2012  ExitCare® Patient Information ©2014 [employer].

## 2013-06-16 NOTE — ED Notes (Addendum)
Pt c/o sore throat and coughing up blood this morning.  Pain score 7/10.  Pt was seen at [facility] yesterday for SOB, admitted, and discharged yesterday afternoon.  Pt reports that he did take the prescribed Prednisone and antibiotics this morning.

## 2013-06-16 NOTE — ED Notes (Signed)
Patient reports he is intermittently coughing up yellowish sputum but is on Levaquin and was concerned about the bright red blood he coughed up this AM.  Patient received Lovenox injection at 11am yesterday to prevent blood clot.

## 2013-06-23 ENCOUNTER — Ambulatory Visit: Payer: BC Managed Care – PPO | Admitting: Internal Medicine

## 2013-06-24 ENCOUNTER — Encounter: Payer: Self-pay | Admitting: Internal Medicine

## 2013-06-24 ENCOUNTER — Encounter: Payer: Self-pay | Admitting: *Deleted

## 2013-06-24 ENCOUNTER — Ambulatory Visit (INDEPENDENT_AMBULATORY_CARE_PROVIDER_SITE_OTHER): Payer: BC Managed Care – PPO | Admitting: Internal Medicine

## 2013-06-24 VITALS — BP 124/84 | HR 101 | Temp 98.2°F | Ht 67.0 in | Wt 211.6 lb

## 2013-06-24 DIAGNOSIS — J45909 Unspecified asthma, uncomplicated: Secondary | ICD-10-CM

## 2013-06-24 NOTE — Progress Notes (Signed)
Pt returned triage's call.  Mark Ward Subjective:     Patient ID: Mark Ward, male   DOB: May 15, 1965   MRN: [id number]    Brief patient profile:  41 yobm never smoker never sign allergies or asthma with new onset cough Feb 2014 variably improved then relapsed seen by ent x 3 by UC x3 2, x ER x 3 and finally admitted:  Admit date: 11/24/2012  Discharge date: 11/26/2012  Discharge Diagnoses:   Moderate persistent asthma with exacerbation    Diet recommendation: Regular.  Filed Weights    11/24/12 1856   Weight:  94.8 kg (208 lb 15.9 oz)   History of present illness:  48 y.o. male with with one-week of progressive SOB/Wheeze. He does not use any Albuterol, nor has he ever been formally diagnosed with Asthma he reports. He has had three recent ED visits for similar issues, given persistent symptoms, he presented to the ED for further evaluation and recommendations. Given Atrovent, Albuterol, Solumedrol, Magnesium, and admitted for further management.  Hospital Course:  Dyspnea/Chest tightness: Patient presented with SOB/Wheeze, of one week's duration, associated with a cough, productive of yellow phlegm, but no fever. CXR was devoid of pneumonic consolidation, and chest CT scan showed no active disease. Patient was recently treated with a nasal spray, for allergic rhinitis. He appears to have had acute bronchitis on this occasion, but as he has had three ED visits for SOB/wheeze in the past one month, and he appears to have underlying obstructive airway disease, although he has no smoking history. Clinically, he responded rather dramatically to bronchodilator nebulizers, steroids and Levaquin. On 11/26/12, he felt much better, and was very keen to be discharged. Outpatient pulmonary follow up has been set up for him on 12/03/12, with Dr Christinia Gully, pulmonologist, for evaluation, PFTs and follow up. He will conclude antibiotic therapy on 12/01/12. Oral steroid taper was prescribed on discharge.     12/03/12 1st pulmonary ov/ Mark Ward cc all better but still tapering prednisone.  rec Dulera 100 Take 2 puffs first thing in am and then another 2 puffs about 12 hours later.  Only use your albuterol (proventil) as needed  12/22/12 rx augmentin bid x 10 day plus prednisone    01/02/2013 f/u ov/Mark Ward    Chief Complaint  Patient presents with  . Followup with PFT    Pt reports breathing some better since last visit. He still c/o having green nasal d/c.  no  need for saba rec Dulera 100 Take 2 puffs first thing in am and then another 2 puffs about 12 hours later    02/03/2013 f/u ov/Mark Ward re:  asthma Chief Complaint  Patient presents with  . Follow-up    Breathing is much better. No new co's today. Has not needed proair since the last visit.  No saba at all, no more purulent secretions  rec Plan A = automatic = dulera 100 Take 2 puffs first thing in am and then another 2 puffs about 12 hours later and blow out through the nose  For breathing Plan B = Backup = proaire 2 puffs every 4 hours as needed  For congestion/cough PLan B is mucinex  The max dose is 1200 mg every 12h as needed For Itching sneezing Plan B Clariton 10 mg    05/12/2013 f/u ov/Mark Ward re: asthma in setting of chronic sinusitis Chief Complaint  Patient presents with  . Follow-up    Pt states having prod cough for the past 3-4 wks- yellow sputum.  He also c/o yelloe nasal d/c and wheezing. Was seen by ENT today and they rec surgery at this point to correct sinus issues.   Using saba maybe 3-4 x per week - wants phenergan with codeine for cough  rec As long as you have sinus problems you will have some lower resp symptoms as well so I would follow Dr [name] rec for surgery and in meantime: Plan A = automatic = dulera 100 Take 2 puffs first thing in am and then another 2 puffs about 12 hours later and blow out through the nose  For breathing Plan B = Backup = proaire 2 puffs every 4 hours as needed  For  congestion/cough PLan B is mucinex  The max dose is 1200 mg every 12h as neede For Itching sneezing Plan B Clariton 10 mg or Zyrtec   06/24/2013 f/u ov/Mark Ward re: sinusitis/ asthma Chief Complaint  Patient presents with  . Follow-up    Was seen in [facility] for SOB, coughing up blood.    Was fine as long as on dulera - not clear why stopped/ never contacted this office before going to ER Not clear now what meds he really has/ on multiple otcs/ much better since er on tapering prednisone. Not using albuterol now  .No obvious daytime variabilty or assoc cp or chest tightness, or hb symptoms. No unusual exp hx or h/o childhood pna/ asthma or knowledge of premature birth.   Sleeping ok without nocturnal  or early am exacerbation  of respiratory  c/o's or need for noct saba. Also denies any obvious fluctuation of symptoms with weather or environmental changes or other aggravating or alleviating factors except as outlined above   Current Medications, Allergies, Past Medical History, Past Surgical History, Family History, and Social History were reviewed in [employer] record.  ROS  The following are not active complaints unless bolded sore throat, dysphagia, dental problems, itching, sneezing,  nasal congestion or excess/ purulent secretions, ear ache,   fever, chills, sweats, unintended wt loss, pleuritic or exertional cp, hemoptysis,  orthopnea pnd or leg swelling, presyncope, palpitations, heartburn, abdominal pain, anorexia, nausea, vomiting, diarrhea  or change in bowel or urinary habits, change in stools or urine, dysuria,hematuria,  rash, arthralgias, visual complaints, headache, numbness weakness or ataxia or problems with walking or coordination,  change in mood/affect or memory.        Objective:   Physical Exam  Pleasant amb bm nad with very nasal tone to voice/ unusual affect   01/02/2013          212  > 02/03/2013  215 > 05/12/2013 211 > 06/24/2013  212  Wt Readings  from Last 3 Encounters:  12/03/12 226 lb (102.513 kg)  11/24/12 208 lb 15.9 oz (94.8 kg)  10/03/12 202 lb (91.627 kg)    HEENT: nl dentition, turbinates, and orophanx. Nl external ear canals without cough reflex   NECK :  without JVD/Nodes/TM/ nl carotid upstrokes bilaterally   LUNGS: no acc muscle use, clear to A and P bilaterally without cough on insp or exp maneuvers   CV:  RRR  no s3 or murmur or increase in P2, no edema   ABD:  soft and nontender with nl excursion in the supine position. No bruits or organomegaly, bowel sounds nl  MS:  warm without deformities, calf tenderness, cyanosis or clubbing  SKIN: warm and dry without lesions         CXR  06/16/13 The findings are consistent with  bilateral interstitial pneumonia.  No alveolar infiltrates are demonstrated. My reading: no sign acute changes     Assessment:

## 2013-06-24 NOTE — Patient Instructions (Signed)
As long as you have sinus problems you will have some lower resp symptoms as well so I would follow Dr [name] rec for surgery and in meantime - call me if not able to schedule appointment with Dr [name]  Plan A = automatic = dulera 100 Take 2 puffs first thing in am and then another 2 puffs about 12 hours later and blow out through the nose   For breathing Plan B = Backup = proaire 2 puffs every 4 hours as needed  For congestion/cough PLan B is mucinex  The max dose is 1200 mg every 12h as neede For Itching sneezing Plan B Clariton 10 mg or Zyrtec   Stop all oil based vitamins    See [name] NP w/in 2 weeks with all your medications, even over the counter meds, separated in two separate bags, the ones you take no matter what vs the ones you stop once you feel better and take only as needed when you feel you need them.   [name]  will generate for you a new user friendly medication calendar that will put [place] all on the same page re: your medication use.    Bring your drug formular (the list of the preferred medications - may need to change to symbicort 80 2 every 12 hours with 25 dollar plan)

## 2013-06-25 NOTE — Assessment & Plan Note (Signed)
-   hfa 75% p coaching 12/03/12 > 90% 01/02/2013  - PFT's 01/02/2013 wnl including fef 25-75 p am dulera 100 x 2 pffs  DDX of  difficult airways managment all start with A and  include Adherence, Ace Inhibitors, Acid Reflux, Active Sinus Disease, Alpha 1 Antitripsin deficiency, Anxiety masquerading as Airways dz,  ABPA,  allergy(esp in young), Aspiration (esp in elderly), Adverse effects of DPI,  Active smokers, plus two Bs  = Bronchiectasis and Beta blocker use..and one C= CHF  Adherence is always the initial "prime suspect" and is a multilayered concern that requires a "trust but verify" approach in every patient - starting with knowing how to use medications, especially inhalers, correctly, keeping up with refills and understanding the fundamental difference between maintenance and prns vs those medications only taken for a very short course and then stopped and not refilled.  - desperately in need of med reconciliation and simplification including eliminating all otcs as much as possible   To keep things simple, I have asked the patient to first separate medicines that are perceived as maintenance, that is to be taken daily "no matter what", from those medicines that are taken on only on an as-needed basis and I have given the patient examples of both, and then return to see our NP to generate a  detailed  medication calendar which should be followed until the next physician sees the patient and updates it.    ?active sinus dz > referred back to Dr [name]

## 2013-07-09 ENCOUNTER — Ambulatory Visit (INDEPENDENT_AMBULATORY_CARE_PROVIDER_SITE_OTHER): Payer: BC Managed Care – PPO | Admitting: Adult Health

## 2013-07-09 ENCOUNTER — Encounter: Payer: Self-pay | Admitting: Adult Health

## 2013-07-09 VITALS — BP 140/84 | HR 90 | Temp 98.1°F | Ht 67.0 in | Wt 210.4 lb

## 2013-07-09 DIAGNOSIS — J45909 Unspecified asthma, uncomplicated: Secondary | ICD-10-CM

## 2013-07-09 NOTE — Patient Instructions (Signed)
Follow med calendar closely and bring to each visit. Continue on current regimen. Make sure to brush rinse and gargle after inhaler use. Continue followup with ENT Follow up Dr. Melvyn Novas in 3 months and as needed

## 2013-07-09 NOTE — Progress Notes (Signed)
Pt returned triage's call.  Mark Ward Subjective:     Patient ID: Mark Ward, male   DOB: February 08, 1966   MRN: [id number]  Brief patient profile:  32 yobm never smoker never sign allergies or asthma with new onset cough Feb 2014 variably improved then relapsed seen by ent x 3 by UC x3 2, x ER x 3 and finally admitted:  Admit date: 11/24/2012  Discharge date: 11/26/2012  Discharge Diagnoses:   Moderate persistent asthma with exacerbation    Diet recommendation: Regular.  Filed Weights    11/24/12 1856   Weight:  94.8 kg (208 lb 15.9 oz)   History of present illness:  48 y.o. male with with one-week of progressive SOB/Wheeze. He does not use any Albuterol, nor has he ever been formally diagnosed with Asthma he reports. He has had three recent ED visits for similar issues, given persistent symptoms, he presented to the ED for further evaluation and recommendations. Given Atrovent, Albuterol, Solumedrol, Magnesium, and admitted for further management.  Hospital Course:  Dyspnea/Chest tightness: Patient presented with SOB/Wheeze, of one week's duration, associated with a cough, productive of yellow phlegm, but no fever. CXR was devoid of pneumonic consolidation, and chest CT scan showed no active disease. Patient was recently treated with a nasal spray, for allergic rhinitis. He appears to have had acute bronchitis on this occasion, but as he has had three ED visits for SOB/wheeze in the past one month, and he appears to have underlying obstructive airway disease, although he has no smoking history. Clinically, he responded rather dramatically to bronchodilator nebulizers, steroids and Levaquin. On 11/26/12, he felt much better, and was very keen to be discharged. Outpatient pulmonary follow up has been set up for him on 12/03/12, with Dr Christinia Gully, pulmonologist, for evaluation, PFTs and follow up. He will conclude antibiotic therapy on 12/01/12. Oral steroid taper was prescribed on discharge.     12/03/12 1st pulmonary ov/ [name] cc all better but still tapering prednisone.  rec Dulera 100 Take 2 puffs first thing in am and then another 2 puffs about 12 hours later.  Only use your albuterol (proventil) as needed  12/22/12 rx augmentin bid x 10 day plus prednisone    01/02/2013 f/u ov/[name]    Chief Complaint  Patient presents with  . Followup with PFT    Pt reports breathing some better since last visit. He still c/o having green nasal d/c.  no  need for saba rec Dulera 100 Take 2 puffs first thing in am and then another 2 puffs about 12 hours later    02/03/2013 f/u ov/[name] re:  asthma Chief Complaint  Patient presents with  . Follow-up    Breathing is much better. No new co's today. Has not needed proair since the last visit.  No saba at all, no more purulent secretions  rec Plan A = automatic = dulera 100 Take 2 puffs first thing in am and then another 2 puffs about 12 hours later and blow out through the nose  For breathing Plan B = Backup = proaire 2 puffs every 4 hours as needed  For congestion/cough PLan B is mucinex  The max dose is 1200 mg every 12h as needed For Itching sneezing Plan B Clariton 10 mg    05/12/2013 f/u ov/[name] re: asthma in setting of chronic sinusitis Chief Complaint  Patient presents with  . Follow-up    Pt states having prod cough for the past 3-4 wks- yellow sputum. He also  c/o yelloe nasal d/c and wheezing. Was seen by ENT today and they rec surgery at this point to correct sinus issues.   Using saba maybe 3-4 x per week - wants phenergan with codeine for cough  rec As long as you have sinus problems you will have some lower resp symptoms as well so I would follow Dr [name] rec for surgery and in meantime: Plan A = automatic = dulera 100 Take 2 puffs first thing in am and then another 2 puffs about 12 hours later and blow out through the nose  For breathing Plan B = Backup = proaire 2 puffs every 4 hours as needed  For  congestion/cough PLan B is mucinex  The max dose is 1200 mg every 12h as neede For Itching sneezing Plan B Clariton 10 mg or Zyrtec   06/24/2013 f/u ov/[name] re: sinusitis/ asthma Chief Complaint  Patient presents with  . Follow-up    Was seen in [facility] for SOB, coughing up blood.    Was fine as long as on dulera - not clear why stopped/ never contacted this office before going to ER Not clear now what meds he really has/ on multiple otcs/ much better since er on tapering prednisone. Not using albuterol now >>dulera, refer to ENT  07/09/2013 Folllow up and Med review  Returns for follow up and med review .  Reports breathing is doing well since last ov.  feels symptoms are more related to molds at work No SABA use.  Insurance will cover [employer].  Reviewed all his meds and organized them into a med calendar closely w/ pt education . Is following with ENT .      Current Medications, Allergies, Past Medical History, Past Surgical History, Family History, and Social History were reviewed in [employer] record.  ROS  The following are not active complaints unless bolded sore throat, dysphagia, dental problems, itching, sneezing,  nasal congestion or excess/ purulent secretions, ear ache,   fever, chills, sweats, unintended wt loss, pleuritic or exertional cp, hemoptysis,  orthopnea pnd or leg swelling, presyncope, palpitations, heartburn, abdominal pain, anorexia, nausea, vomiting, diarrhea  or change in bowel or urinary habits, change in stools or urine, dysuria,hematuria,  rash, arthralgias, visual complaints, headache, numbness weakness or ataxia or problems with walking or coordination,  change in mood/affect or memory.        Objective:   Physical Exam  Pleasant amb bm nad with very nasal tone to voice/ unusual affect   01/02/2013          212  > 02/03/2013  215 > 05/12/2013 211 > 06/24/2013  212>210 07/09/13    HEENT: nl dentition, turbinates, and orophanx. Nl  external ear canals without cough reflex   NECK :  without JVD/Nodes/TM/ nl carotid upstrokes bilaterally   LUNGS: no acc muscle use, clear to A and P bilaterally without cough on insp or exp maneuvers   CV:  RRR  no s3 or murmur or increase in P2, no edema   ABD:  soft and nontender with nl excursion in the supine position. No bruits or organomegaly, bowel sounds nl  MS:  warm without deformities, calf tenderness, cyanosis or clubbing  SKIN: warm and dry without lesions         CXR  06/16/13 The findings are consistent with bilateral interstitial pneumonia.  No alveolar infiltrates are demonstrated. My reading: no sign acute changes     Assessment:

## 2013-07-10 NOTE — Assessment & Plan Note (Addendum)
Improved control on present regimen  Patient's medications were reviewed today and patient education was given. Computerized medication calendar was adjusted/completed   Plan  Follow med calendar closely and bring to each visit. Continue on current regimen. Make sure to brush rinse and gargle after inhaler use. Continue followup with ENT Follow up Dr. Melvyn Novas in 3 months and as needed

## 2013-07-13 NOTE — Addendum Note (Signed)
Addended by: Maurice March on: 07/13/2013 12:28 PM   Modules accepted: Orders

## 2013-07-28 ENCOUNTER — Telehealth: Payer: Self-pay | Admitting: Internal Medicine

## 2013-07-28 NOTE — Telephone Encounter (Signed)
Faxed last OV and recent labs (taken on 06/16/2013) to Jan at [phone].

## 2013-08-04 ENCOUNTER — Other Ambulatory Visit: Payer: Self-pay | Admitting: Otolaryngology

## 2013-12-26 ENCOUNTER — Other Ambulatory Visit: Payer: Self-pay | Admitting: Internal Medicine

## 2013-12-30 ENCOUNTER — Ambulatory Visit (INDEPENDENT_AMBULATORY_CARE_PROVIDER_SITE_OTHER)
Admission: RE | Admit: 2013-12-30 | Discharge: 2013-12-30 | Disposition: A | Discharge status: Home / Self Care | Payer: BC Managed Care – PPO | Source: Ambulatory Visit | Attending: Internal Medicine | Admitting: Internal Medicine

## 2013-12-30 ENCOUNTER — Encounter: Payer: Self-pay | Admitting: Internal Medicine

## 2013-12-30 ENCOUNTER — Ambulatory Visit (INDEPENDENT_AMBULATORY_CARE_PROVIDER_SITE_OTHER): Payer: BC Managed Care – PPO | Admitting: Internal Medicine

## 2013-12-30 VITALS — BP 140/90 | HR 110 | Temp 98.9°F | Ht 67.0 in | Wt 213.0 lb

## 2013-12-30 DIAGNOSIS — J45901 Unspecified asthma with (acute) exacerbation: Secondary | ICD-10-CM

## 2013-12-30 DIAGNOSIS — J310 Chronic rhinitis: Secondary | ICD-10-CM

## 2013-12-30 DIAGNOSIS — Z23 Encounter for immunization: Secondary | ICD-10-CM

## 2013-12-30 NOTE — Progress Notes (Signed)
Quick Note:  Spoke with pt's mother and notified of results per Dr. Melvyn Novas. She verbalized understanding and denied any questions. Will inform the pt and he will call if needed  ______

## 2013-12-30 NOTE — Progress Notes (Signed)
Pt returned triage's call.  [name] Subjective:     Patient ID: Mark Ward, male   DOB: [birth year]   MRN: [id number]  Brief patient profile:  17 yobm never smoker never sign allergies or asthma with new onset cough Feb 2014 variably improved then relapsed seen by ent x 3 by UC x3 2, x ER x 3 and finally admitted:  Admit date: 11/24/2012  Discharge date: 11/26/2012  Discharge Diagnoses:   Moderate persistent asthma with exacerbation    Diet recommendation: Regular.  Filed Weights    11/24/12 1856   Weight:  94.8 kg (208 lb 15.9 oz)   History of present illness:  48 y.o. male with with one-week of progressive SOB/Wheeze. He does not use any Albuterol, nor has he ever been formally diagnosed with Asthma he reports. He has had three recent ED visits for similar issues, given persistent symptoms, he presented to the ED for further evaluation and recommendations. Given Atrovent, Albuterol, Solumedrol, Magnesium, and admitted for further management.  Hospital Course:  Dyspnea/Chest tightness: Patient presented with SOB/Wheeze, of one week's duration, associated with a cough, productive of yellow phlegm, but no fever. CXR was devoid of pneumonic consolidation, and chest CT scan showed no active disease. Patient was recently treated with a nasal spray, for allergic rhinitis. He appears to have had acute bronchitis on this occasion, but as he has had three ED visits for SOB/wheeze in the past one month, and he appears to have underlying obstructive airway disease, although he has no smoking history. Clinically, he responded rather dramatically to bronchodilator nebulizers, steroids and Levaquin. On 11/26/12, he felt much better, and was very keen to be discharged. Outpatient pulmonary follow up has been set up for him on 12/03/12, with Dr Christinia Gully, pulmonologist, for evaluation, PFTs and follow up. He will conclude antibiotic therapy on 12/01/12. Oral steroid taper was prescribed on discharge.     12/03/12 1st pulmonary ov/ Kaoru Rezendes cc all better but still tapering prednisone.  rec Dulera 100 Take 2 puffs first thing in am and then another 2 puffs about 12 hours later.  Only use your albuterol (proventil) as needed  12/22/12 rx augmentin bid x 10 day plus prednisone    01/02/2013 f/u ov/Marlenne [place]    Chief Complaint  Patient presents with  . Followup with PFT    Pt reports breathing some better since last visit. He still c/o having green nasal d/c.  no  need for saba rec Dulera 100 Take 2 puffs first thing in am and then another 2 puffs about 12 hours later    02/03/2013 f/u ov/Lamberto Dinapoli re:  asthma Chief Complaint  Patient presents with  . Follow-up    Breathing is much better. No new co's today. Has not needed proair since the last visit.  No saba at all, no more purulent secretions  rec Plan A = automatic = dulera 100 Take 2 puffs first thing in am and then another 2 puffs about 12 hours later and blow out through the nose  For breathing Plan B = Backup = proaire 2 puffs every 4 hours as needed  For congestion/cough PLan B is mucinex  The max dose is 1200 mg every 12h as needed For Itching sneezing Plan B Clariton 10 mg    05/12/2013 f/u ov/Claretha Townshend re: asthma in setting of chronic sinusitis Chief Complaint  Patient presents with  . Follow-up    Pt states having prod cough for the past 3-4 wks- yellow sputum. He also  c/o yelloe nasal d/c and wheezing. Was seen by ENT today and they rec surgery at this point to correct sinus issues.   Using saba maybe 3-4 x per week - wants phenergan with codeine for cough  rec As long as you have sinus problems you will have some lower resp symptoms as well so I would follow Dr [name] rec for surgery and in meantime: Plan A = automatic = dulera 100 Take 2 puffs first thing in am and then another 2 puffs about 12 hours later and blow out through the nose  For breathing Plan B = Backup = proaire 2 puffs every 4 hours as needed  For  congestion/cough PLan B is mucinex  The max dose is 1200 mg every 12h as neede For Itching sneezing Plan B Clariton 10 mg or Zyrtec   06/24/2013 f/u ov/Hendrix Console re: sinusitis/ asthma Chief Complaint  Patient presents with  . Follow-up    Was seen in [facility] for SOB, coughing up blood.    Was fine as long as on dulera - not clear why stopped/ never contacted this office before going to ER Not clear now what meds he really has/ on multiple otcs/ much better since er on tapering prednisone. Not using albuterol now >>dulera, refer to ENT  07/09/2013 Folllow up and Med review  Returns for follow up and med review .  Reports breathing is doing well since last ov.  feels symptoms are more related to molds at work No SABA use.  Insurance will cover [employer].  Reviewed all his meds and organized them into a med calendar closely w/ pt education . Is following with ENT > sinus surgery 08/04/13 [name]   12/30/2013 f/u ov/Fabien Travelstead re: asthma / not using med calendar provided  Chief Complaint  Patient presents with  . Follow-up    Pt states that his breathing is doing well. He has not had to use rescue inhaler since last visit.   variable nasal obst symptoms, not using nasal steroids on any kind of consistent basis. Overall much better since sinus surgery Working out at gym but mostly with wts, no aerobics, no need for saba   No obvious day to day or daytime variabilty or assoc chronic cough or cp or chest tightness, subjective wheeze or overt   hb symptoms. No unusual exp hx or h/o childhood pna/ asthma or knowledge of premature birth.  Sleeping ok without nocturnal  or early am exacerbation  of respiratory  c/o's or need for noct saba. Also denies any obvious fluctuation of symptoms with weather or environmental changes or other aggravating or alleviating factors except as outlined above   Current Medications, Allergies, Complete Past Medical History, Past Surgical History, Family History, and Social History were  reviewed in [employer] record.  ROS  The following are not active complaints unless bolded sore throat, dysphagia, dental problems, itching, sneezing,  nasal congestion or excess/ purulent secretions, ear ache,   fever, chills, sweats, unintended wt loss, pleuritic or exertional cp, hemoptysis,  orthopnea pnd or leg swelling, presyncope, palpitations, heartburn, abdominal pain, anorexia, nausea, vomiting, diarrhea  or change in bowel or urinary habits, change in stools or urine, dysuria,hematuria,  rash, arthralgias, visual complaints, headache, numbness weakness or ataxia or problems with walking or coordination,  change in mood/affect or memory.             Objective:   Physical Exam  Pleasant amb bm nad with mild  nasal tone to voice/ unusual  affect   01/02/2013  212  > 02/03/2013  215 > 05/12/2013 211 > 06/24/2013  212>210 07/09/13 > 12/30/2013  213    HEENT: nl dentition, turbinates, and orophanx. Nl external ear canals without cough reflex   NECK :  without JVD/Nodes/TM/ nl carotid upstrokes bilaterally   LUNGS: no acc muscle use, clear to A and P bilaterally without cough on insp or exp maneuvers   CV:  RRR  no s3 or murmur or increase in P2, no edema   ABD:  soft and nontender with nl excursion in the supine position. No bruits or organomegaly, bowel sounds nl  MS:  warm without deformities, calf tenderness, cyanosis or clubbing  SKIN: warm and dry without lesions         CXR  12/30/2013 :  No evidence for acute cardiopulmonary abnormality.      Assessment:

## 2013-12-30 NOTE — Assessment & Plan Note (Signed)
-  PFT's 01/02/2013 wnl including fef 25-75 p am dulera 100 x 2 pffs - Med calendar 07/09/2013 > not using 12/30/2013  - 12/30/2013 p extensive coaching HFA effectiveness =    90%   All goals of chronic asthma control met including optimal function and elimination of symptoms with minimal need for rescue therapy.  Contingencies discussed in full including contacting this office immediately if not controlling the symptoms using the rule of two's.     The proper method of use, as well as anticipated side effects, of a metered-dose inhaler are discussed and demonstrated to the patient. Improved effectiveness after extensive coaching during this visit to a level of approximately  90%    

## 2013-12-30 NOTE — Patient Instructions (Signed)
Please remember to go to the  x-ray department downstairs for your tests - we will call you with the results when they are available.  See calendar for specific medication instructions and bring it back for each and every office visit for every healthcare provider you see.  Without it,  you may not receive the best quality medical care that we feel you deserve.  You will note that the calendar groups together  your maintenance  medications that are timed at particular times of the day.  Think of this as your checklist for what your doctor has instructed you to do until your next evaluation to see what benefit  there is  to staying on a consistent group of medications intended to keep you well.  The other group at the bottom is entirely up to you to use as you see fit  for specific symptoms that may arise between visits that require you to treat them on an as needed basis.  Think of this as your action plan or "what if" list.   Separating the top medications from the bottom group is fundamental to providing you adequate care going forward.    Please schedule a follow up visit in 3 months but call sooner if needed     

## 2013-12-30 NOTE — Assessment & Plan Note (Signed)
-   Augmentin x 20 days to be complete 01/12/13  -  Sinus CT 05/05/13 > Complete opacification of visualized portions of the frontal sinuses and ethmoid sinus air cells. No obvious intraorbital or intracranial extension. Moderate mucosal thickening/partial opacification sphenoid sinus air cells (left sphenoid sinus air cell is dominant). Polypoid moderate mucosal thickening maxillary sinuses. - rec ENT eval 05/06/2013 > surgery ([facility]) August 04 2013 > improved  I reviewed both graphic and text formatted material regarding the diagnosis and management of rhinitis including how to use topical steroids effectively and why it is necessary to treat nasal obstruction and inflammation and infection all at  the same time to eradicate the cycle of inflammation causing obstruction causing an infection causing inflammation and so forth and so on.... Changed flonase to qhs for now and use afrin x 5 days first whenever having worsening nasal obst

## 2014-01-20 ENCOUNTER — Telehealth: Payer: Self-pay | Admitting: Internal Medicine

## 2014-01-20 NOTE — Telephone Encounter (Signed)
Called pt. appt scheduled to see MW tomorrow at 9:15. Nothing further needed

## 2014-01-21 ENCOUNTER — Encounter: Payer: Self-pay | Admitting: Internal Medicine

## 2014-01-21 ENCOUNTER — Ambulatory Visit (INDEPENDENT_AMBULATORY_CARE_PROVIDER_SITE_OTHER): Payer: BC Managed Care – PPO | Admitting: Internal Medicine

## 2014-01-21 VITALS — BP 130/89 | HR 117 | Temp 98.2°F | Ht 67.0 in | Wt 207.8 lb

## 2014-01-21 DIAGNOSIS — J45909 Unspecified asthma, uncomplicated: Secondary | ICD-10-CM

## 2014-01-21 DIAGNOSIS — J310 Chronic rhinitis: Secondary | ICD-10-CM

## 2014-01-21 MED ORDER — PREDNISONE 10 MG PO TABS: ORAL_TABLET | ORAL | Status: DC

## 2014-01-21 NOTE — Progress Notes (Signed)
Pt returned triage's call.  Mark Ward Subjective:     Patient ID: Mark Ward, male   DOB: Aug 07, 1965   MRN: [id number]  Brief patient profile:  74 yobm never smoker never sign allergies or asthma with new onset cough Feb 2014 variably improved then relapsed seen by ent x 3 by UC x3 2, x ER x 3 and finally admitted:  Admit date: 11/24/2012  Discharge date: 11/26/2012  Discharge Diagnoses:   Moderate persistent asthma with exacerbation    Diet recommendation: Regular.  Filed Weights    11/24/12 1856   Weight:  94.8 kg (208 lb 15.9 oz)   History of present illness:  48 y.o. male with with one-week of progressive SOB/Wheeze. He does not use any Albuterol, nor has he ever been formally diagnosed with Asthma he reports. He has had three recent ED visits for similar issues, given persistent symptoms, he presented to the ED for further evaluation and recommendations. Given Atrovent, Albuterol, Solumedrol, Magnesium, and admitted for further management.  Hospital Course:  Dyspnea/Chest tightness: Patient presented with SOB/Wheeze, of one week's duration, associated with a cough, productive of yellow phlegm, but no fever. CXR was devoid of pneumonic consolidation, and chest CT scan showed no active disease. Patient was recently treated with a nasal spray, for allergic rhinitis. He appears to have had acute bronchitis on this occasion, but as he has had three ED visits for SOB/wheeze in the past one month, and he appears to have underlying obstructive airway disease, although he has no smoking history. Clinically, he responded rather dramatically to bronchodilator nebulizers, steroids and Levaquin. On 11/26/12, he felt much better, and was very keen to be discharged. Outpatient pulmonary follow up has been set up for him on 12/03/12, with Dr Christinia Gully, pulmonologist, for evaluation, PFTs and follow up. He will conclude antibiotic therapy on 12/01/12. Oral steroid taper was prescribed on discharge.     12/03/12 1st pulmonary ov/ Mark Ward cc all better but still tapering prednisone.  rec Dulera 100 Take 2 puffs first thing in am and then another 2 puffs about 12 hours later.  Only use your albuterol (proventil) as needed  12/22/12 rx augmentin bid x 10 day plus prednisone    01/02/2013 f/u ov/Mark Ward    Chief Complaint  Patient presents with  . Followup with PFT    Pt reports breathing some better since last visit. He still c/o having green nasal d/c.  no  need for saba rec Dulera 100 Take 2 puffs first thing in am and then another 2 puffs about 12 hours later    02/03/2013 f/u ov/Mark Ward re:  asthma Chief Complaint  Patient presents with  . Follow-up    Breathing is much better. No new co's today. Has not needed proair since the last visit.  No saba at all, no more purulent secretions  rec Plan A = automatic = dulera 100 Take 2 puffs first thing in am and then another 2 puffs about 12 hours later and blow out through the nose  For breathing Plan B = Backup = proaire 2 puffs every 4 hours as needed  For congestion/cough PLan B is mucinex  The max dose is 1200 mg every 12h as needed For Itching sneezing Plan B Clariton 10 mg    05/12/2013 f/u ov/Mark Ward re: asthma in setting of chronic sinusitis Chief Complaint  Patient presents with  . Follow-up    Pt states having prod cough for the past 3-4 wks- yellow sputum. He also  c/o yelloe nasal d/c and wheezing. Was seen by ENT today and they rec surgery at this point to correct sinus issues.   Using saba maybe 3-4 x per week - wants phenergan with codeine for cough  rec As long as you have sinus problems you will have some lower resp symptoms as well so I would follow Dr Mark Ward rec for surgery and in meantime: Plan A = automatic = dulera 100 Take 2 puffs first thing in am and then another 2 puffs about 12 hours later and blow out through the nose  For breathing Plan B = Backup = proaire 2 puffs every 4 hours as needed  For  congestion/cough PLan B is mucinex  The max dose is 1200 mg every 12h as neede For Itching sneezing Plan B Clariton 10 mg or Zyrtec   06/24/2013 f/u ov/Mark Ward re: sinusitis/ asthma Chief Complaint  Patient presents with  . Follow-up    Was seen in [facility] for SOB, coughing up blood.    Was fine as long as on dulera - not clear why stopped/ never contacted this office before going to ER Not clear now what meds he really has/ on multiple otcs/ much better since er on tapering prednisone. Not using albuterol now >>dulera, refer to ENT  07/09/2013 Folllow up and Med review  Returns for follow up and med review .  Reports breathing is doing well since last ov.  feels symptoms are more related to molds at work No SABA use.  Insurance will cover [employer].  Reviewed all his meds and organized them into a med calendar closely w/ pt education . Is following with ENT > sinus surgery 08/04/13 Mark Ward   12/30/2013 f/u ov/Mark Ward re: asthma / not using med calendar provided  Chief Complaint  Patient presents with  . Follow-up    Pt states that his breathing is doing well. He has not had to use rescue inhaler since last visit.   variable nasal obst symptoms, not using nasal steroids on any kind of consistent basis. Overall much better since sinus surgery Working out at gym but mostly with wts, no aerobics, no need for saba  rec Change flonase to hs daily    01/21/2014 f/u ov/Mark Ward re: ? Asthma flare x one week  Chief Complaint  Patient presents with  . Acute Visit    Pt c/o increased cough "loud and piercing cough"- non prod. He also c/o wheezing- onset approx 1 wk ago. Has not used albuterol inhaler.   not using med calendar correclty esp action plans for saba and afrin   No obvious day to day or daytime variabilty or assoc purulent or excess mucus production cp or chest tightness, subjective wheeze or overt   hb symptoms. No unusual exp hx or h/o childhood pna/ asthma or knowledge of premature  birth.  Sleeping ok without nocturnal  or early am exacerbation  of respiratory  c/o's or need for noct saba. Also denies any obvious fluctuation of symptoms with weather or environmental changes or other aggravating or alleviating factors except as outlined above   Current Medications, Allergies, Complete Past Medical History, Past Surgical History, Family History, and Social History were reviewed in [employer] record.  ROS  The following are not active complaints unless bolded sore throat, dysphagia, dental problems, itching, sneezing,  nasal congestion or excess/ purulent secretions, ear ache,   fever, chills, sweats, unintended wt loss, pleuritic or exertional cp, hemoptysis,  orthopnea pnd or leg swelling, presyncope,  palpitations, heartburn, abdominal pain, anorexia, nausea, vomiting, diarrhea  or change in bowel or urinary habits, change in stools or urine, dysuria,hematuria,  rash, arthralgias, visual complaints, headache, numbness weakness or ataxia or problems with walking or coordination,  change in mood/affect or memory.             Objective:   Physical Exam  Pleasant amb bm nad with mild  nasal tone to voice/ unusual affect   01/02/2013  212  > 02/03/2013  215 > 05/12/2013 211 > 06/24/2013  212>210 07/09/13 > 12/30/2013  213 > 01/21/2014  208    HEENT: nl dentition, turbinates, and orophanx. Nl external ear canals without cough reflex   NECK :  without JVD/Nodes/TM/ nl carotid upstrokes bilaterally   LUNGS: no acc muscle use, insp and exp rhonchi   CV:  RRR  no s3 or murmur or increase in P2, no edema   ABD:  soft and nontender with nl excursion in the supine position. No bruits or organomegaly, bowel sounds nl  MS:  warm without deformities, calf tenderness, cyanosis or clubbing  SKIN: warm and dry without lesions         CXR  12/30/2013 :  No evidence for acute cardiopulmonary abnormality.      Assessment:

## 2014-01-21 NOTE — Patient Instructions (Addendum)
Prednisone 10 mg take  4 each am x 2 days,   2 each am x 2 days,  1 each am x 2 days and stop   Increase the flonase to twice daily   Nasal steroids(flonase)  have no immediate benefit in terms of improving symptoms.  To help them reached the target tissue, the patient should use Afrin two puffs every 12 hours applied one min before using the nasal steroids.  Afrin should be stopped after no more than 5 days.  If the symptoms worsen, Afrin can be restarted after 5 days off of therapy to prevent rebound congestion from overuse of Afrin.  I also emphasized that in no way are nasal steroids a concern in terms of "addiction".   See [name] NP in  2 weeks with all your medications, even over the counter meds, separated in two separate bags, the ones you take no matter what vs the ones you stop once you feel better and take only as needed when you feel you need them.   [name]  will generate for you a new user friendly medication calendar that will put [place] all on the same page re: your medication use.     Without this process, it simply isn't possible to assure that we are providing  your outpatient care  with  the attention to detail we feel you deserve.   If we cannot assure that you're getting that kind of care,  then we cannot manage your problem effectively from this clinic.  Once you have seen [name] and we are sure that we're all on the same page with your medication use she will arrange follow up with me.

## 2014-01-22 NOTE — Assessment & Plan Note (Signed)
-   Augmentin x 20 days to be complete 01/12/13  -  Sinus CT 05/05/13 > Complete opacification of visualized portions of the frontal sinuses and ethmoid sinus air cells. No obvious intraorbital or intracranial extension. Moderate mucosal thickening/partial opacification sphenoid sinus air cells (left sphenoid sinus air cell is dominant). Polypoid moderate mucosal thickening maxillary sinuses. - rec ENT eval 05/06/2013 > surgery ([facility]) August 04 2013 > improved  Reviewed approp use of afrin to help the flonase get to the target tissue

## 2014-01-22 NOTE — Assessment & Plan Note (Signed)
-   PFT's 01/02/2013 wnl including fef 25-75 p am dulera 100 x 2 pffs - Med calendar 07/09/2013 > not using 12/30/2013 > not using action plans approp 01/21/2014  - 12/30/2013 p extensive coaching HFA effectiveness =    90%    I had an extended discussion with the patient today lasting 15 to 20 minutes of a 25 minute visit on the following issues:    Each maintenance medication was reviewed in detail including most importantly the difference between maintenance and as needed and under what circumstances the prns are to be used. This was done in the context of a medication calendar review which provided the patient with a user-friendly unambiguous mechanism for medication administration and reconciliation and provides an action plan for all active problems. It is critical that this be shown to every doctor  for modification during the office visit if necessary so the patient can use it as a working document.      For now just needs short course pred then regroup with all meds in hand in 2 weeks for new med calendar/ full med reconciliation

## 2014-02-02 ENCOUNTER — Other Ambulatory Visit: Payer: Self-pay | Admitting: Internal Medicine

## 2014-02-05 ENCOUNTER — Ambulatory Visit (INDEPENDENT_AMBULATORY_CARE_PROVIDER_SITE_OTHER): Payer: BC Managed Care – PPO | Admitting: Adult Health

## 2014-02-05 ENCOUNTER — Other Ambulatory Visit (INDEPENDENT_AMBULATORY_CARE_PROVIDER_SITE_OTHER): Payer: BC Managed Care – PPO

## 2014-02-05 ENCOUNTER — Encounter: Payer: Self-pay | Admitting: Adult Health

## 2014-02-05 VITALS — BP 124/80 | HR 111 | Temp 98.8°F | Ht 67.0 in | Wt 208.6 lb

## 2014-02-05 DIAGNOSIS — J45909 Unspecified asthma, uncomplicated: Secondary | ICD-10-CM

## 2014-02-05 DIAGNOSIS — J310 Chronic rhinitis: Secondary | ICD-10-CM

## 2014-02-05 LAB — CBC WITH DIFFERENTIAL/PLATELET
BASOS ABS: 0 10*3/uL (ref 0.0–0.1)
BASOS PCT: 0.2 % (ref 0.0–3.0)
EOS ABS: 0.6 10*3/uL (ref 0.0–0.7)
Eosinophils Relative: 8.3 % — ABNORMAL HIGH (ref 0.0–5.0)
HCT: 47.6 % (ref 39.0–52.0)
Hemoglobin: 15.6 g/dL (ref 13.0–17.0)
LYMPHS PCT: 19.9 % (ref 12.0–46.0)
Lymphs Abs: 1.3 10*3/uL (ref 0.7–4.0)
MCHC: 32.7 g/dL (ref 30.0–36.0)
MCV: 88.4 fl (ref 78.0–100.0)
MONO ABS: 0.6 10*3/uL (ref 0.1–1.0)
Monocytes Relative: 9.6 % (ref 3.0–12.0)
NEUTROS ABS: 4.2 10*3/uL (ref 1.4–7.7)
NEUTROS PCT: 62 % (ref 43.0–77.0)
Platelets: 213 10*3/uL (ref 150.0–400.0)
RBC: 5.39 Mil/uL (ref 4.22–5.81)
RDW: 14.2 % (ref 11.5–15.5)
WBC: 6.7 10*3/uL (ref 4.0–10.5)

## 2014-02-05 MED ORDER — MOMETASONE FURO-FORMOTEROL FUM 100-5 MCG/ACT IN AERO: INHALATION_SPRAY | RESPIRATORY_TRACT | Status: DC

## 2014-02-05 NOTE — Addendum Note (Signed)
Addended by: Parke Poisson E on: 02/05/2014 03:34 PM   Modules accepted: Orders

## 2014-02-05 NOTE — Patient Instructions (Signed)
Follow med calendar closely and bring to each visit. Continue on current regimen. Make sure to brush rinse and gargle after inhaler use. Continue followup with ENT as planned  Follow up Dr. Melvyn Novas in 6-8  weeks  and as needed

## 2014-02-05 NOTE — Assessment & Plan Note (Signed)
Acute on chronic sinus disease   Plan  Finish Levaquin  Follow med calendar closely and bring to each visit. Continue on current regimen. Make sure to brush rinse and gargle after inhaler use. Continue followup with ENT as planned  Follow up Dr. Melvyn Novas in 6-8  weeks  and as needed

## 2014-02-05 NOTE — Progress Notes (Signed)
Pt returned triage's call.  [name] Subjective:     Patient ID: Mark Ward, male   DOB: 10/21/1965   MRN: [id number]  Brief patient profile:  43 yobm never smoker never sign allergies or asthma with new onset cough Feb 2014 variably improved then relapsed seen by ent x 3 by UC x3 2, x ER x 3 and finally admitted:  Admit date: 11/24/2012  Discharge date: 11/26/2012  Discharge Diagnoses:   Moderate persistent asthma with exacerbation    Diet recommendation: Regular.  Filed Weights    11/24/12 1856   Weight:  94.8 kg (208 lb 15.9 oz)   History of present illness:  48 y.o. male with with one-week of progressive SOB/Wheeze. He does not use any Albuterol, nor has he ever been formally diagnosed with Asthma he reports. He has had three recent ED visits for similar issues, given persistent symptoms, he presented to the ED for further evaluation and recommendations. Given Atrovent, Albuterol, Solumedrol, Magnesium, and admitted for further management.  Hospital Course:  Dyspnea/Chest tightness: Patient presented with SOB/Wheeze, of one week's duration, associated with a cough, productive of yellow phlegm, but no fever. CXR was devoid of pneumonic consolidation, and chest CT scan showed no active disease. Patient was recently treated with a nasal spray, for allergic rhinitis. He appears to have had acute bronchitis on this occasion, but as he has had three ED visits for SOB/wheeze in the past one month, and he appears to have underlying obstructive airway disease, although he has no smoking history. Clinically, he responded rather dramatically to bronchodilator nebulizers, steroids and Levaquin. On 11/26/12, he felt much better, and was very keen to be discharged. Outpatient pulmonary follow up has been set up for him on 12/03/12, with Dr Christinia Gully, pulmonologist, for evaluation, PFTs and follow up. He will conclude antibiotic therapy on 12/01/12. Oral steroid taper was prescribed on discharge.     12/03/12 1st pulmonary ov/ [name] cc all better but still tapering prednisone.  rec Dulera 100 Take 2 puffs first thing in am and then another 2 puffs about 12 hours later.  Only use your albuterol (proventil) as needed  12/22/12 rx augmentin bid x 10 day plus prednisone    01/02/2013 f/u ov/[name]    Chief Complaint  Patient presents with  . Followup with PFT    Pt reports breathing some better since last visit. He still c/o having green nasal d/c.  no  need for saba rec Dulera 100 Take 2 puffs first thing in am and then another 2 puffs about 12 hours later    02/03/2013 f/u ov/[name] re:  asthma Chief Complaint  Patient presents with  . Follow-up    Breathing is much better. No new co's today. Has not needed proair since the last visit.  No saba at all, no more purulent secretions  rec Plan A = automatic = dulera 100 Take 2 puffs first thing in am and then another 2 puffs about 12 hours later and blow out through the nose  For breathing Plan B = Backup = proaire 2 puffs every 4 hours as needed  For congestion/cough PLan B is mucinex  The max dose is 1200 mg every 12h as needed For Itching sneezing Plan B Clariton 10 mg    05/12/2013 f/u ov/[name] re: asthma in setting of chronic sinusitis Chief Complaint  Patient presents with  . Follow-up    Pt states having prod cough for the past 3-4 wks- yellow sputum. He also  c/o yelloe nasal d/c and wheezing. Was seen by ENT today and they rec surgery at this point to correct sinus issues.   Using saba maybe 3-4 x per week - wants phenergan with codeine for cough  rec As long as you have sinus problems you will have some lower resp symptoms as well so I would follow Dr [name] rec for surgery and in meantime: Plan A = automatic = dulera 100 Take 2 puffs first thing in am and then another 2 puffs about 12 hours later and blow out through the nose  For breathing Plan B = Backup = proaire 2 puffs every 4 hours as needed  For  congestion/cough PLan B is mucinex  The max dose is 1200 mg every 12h as neede For Itching sneezing Plan B Clariton 10 mg or Zyrtec   06/24/2013 f/u ov/[name] re: sinusitis/ asthma Chief Complaint  Patient presents with  . Follow-up    Was seen in [facility] for SOB, coughing up blood.    Was fine as long as on dulera - not clear why stopped/ never contacted this office before going to ER Not clear now what meds he really has/ on multiple otcs/ much better since er on tapering prednisone. Not using albuterol now >>dulera, refer to ENT  07/09/2013 Folllow up and Med review  Returns for follow up and med review .  Reports breathing is doing well since last ov.  feels symptoms are more related to molds at work No SABA use.  Insurance will cover [employer].  Reviewed all his meds and organized them into a med calendar closely w/ pt education . Is following with ENT > sinus surgery 08/04/13 [name]   12/30/2013 f/u ov/[name] re: asthma / not using med calendar provided  Chief Complaint  Patient presents with  . Follow-up    Pt states that his breathing is doing well. He has not had to use rescue inhaler since last visit.   variable nasal obst symptoms, not using nasal steroids on any kind of consistent basis. Overall much better since sinus surgery Working out at gym but mostly with wts, no aerobics, no need for saba  rec Change flonase to hs daily    01/21/2014 f/u ov/[name] re: ? Asthma flare x one week  Chief Complaint  Patient presents with  . Acute Visit    Pt c/o increased cough "loud and piercing cough"- non prod. He also c/o wheezing- onset approx 1 wk ago. Has not used albuterol inhaler.   not using med calendar correclty esp action plans for saba and afrin  >>Pred taper   02/05/2014 Follow up and Med Review  History of asthma , and chronic sinus disease Patient presents for a two-week followup for asthma exacerbation, and medication review. Patient was seen last visit, with an asthma flare. He  was treated with a prednisone taper. Patient reports that symptoms did improve however, continued to have, nasal congestion, sinus pain, pressure, and drainage. Patient was seen by his ENT and was given a prescription for Levaquin, which he is currently on. Patient says coughing, congestion, and sinus pressure is improving. Patient denies any hemoptysis, orthopnea, PND, or leg swelling. We reviewed all his medications and organized them into a medication calendar with patient education. It appears that he is taking his medications correctly. Patient does continue to get intermittent wheezing, but this is improved as well. Patient says that he has not been on Singulair in the past. He has been referred to an allergist  by his ENT But has not made an appointment as of yet.    Current Medications, Allergies, Complete Past Medical History, Past Surgical History, Family History, and Social History were reviewed in [employer] record.  ROS  The following are not active complaints unless bolded sore throat, dysphagia, dental problems, itching, sneezing,  nasal congestion ear ache,   fever, chills, sweats, unintended wt loss, pleuritic or exertional cp, hemoptysis,  orthopnea pnd or leg swelling, presyncope, palpitations, heartburn, abdominal pain, anorexia, nausea, vomiting, diarrhea  or change in bowel or urinary habits, change in stools or urine, dysuria,hematuria,  rash, arthralgias, visual complaints, headache, numbness weakness or ataxia or problems with walking or coordination,  change in mood/affect or memory.             Objective:   Physical Exam  Pleasant amb bm nad with mild  nasal tone to voice  01/02/2013  212  > 02/03/2013  215 > 05/12/2013 211 > 06/24/2013  212>210 07/09/13 > 12/30/2013  213 > 01/21/2014  208 >208 02/05/2014    HEENT: nl dentition, and orophanx. Nl external ear canals without cough reflex, bilateral nasal mucosa puffiness/w/ clear discharge    NECK :   without JVD/Nodes/TM/ nl carotid upstrokes bilaterally   LUNGS: no acc muscle use, few trace wheezes    CV:  RRR  no s3 or murmur or increase in P2, no edema   ABD:  soft and nontender with nl excursion in the supine position. No bruits or organomegaly, bowel sounds nl  MS:  warm without deformities, calf tenderness, cyanosis or clubbing  SKIN: warm and dry without lesions         CXR  12/30/2013 :  No evidence for acute cardiopulmonary abnormality.      Assessment:

## 2014-02-05 NOTE — Assessment & Plan Note (Signed)
Resolving Exacerbation  Check labs w/ cbc w/ diff looking at eosinophils, IgE and RAST  Consider adding singulair on return  Patient's medications were reviewed today and patient education was given. Computerized medication calendar was adjusted/completed   Plan  Follow med calendar closely and bring to each visit. Continue on current regimen. Make sure to brush rinse and gargle after inhaler use. Continue followup with ENT as planned  Follow up Dr. Melvyn Novas in 6-8  weeks  and as needed

## 2014-02-09 LAB — ALLERGY FULL PROFILE
Allergen,Goose feathers, e70: <0.1 kU/L
Bermuda Grass: <0.1 kU/L
Box Elder IgE: <0.1 kU/L
Candida Albicans: <0.1 kU/L
Curvularia lunata: <0.1 kU/L
D. farinae: <0.1 kU/L
Fescue: <0.1 kU/L
G005 Rye, Perennial: <0.1 kU/L
IgE (Immunoglobulin E), Serum: 682 kU/L — ABNORMAL HIGH (ref <115)
Oak: <0.1 kU/L
Stemphylium Botryosum: <0.1 kU/L
Timothy Grass: <0.1 kU/L

## 2014-02-16 NOTE — Progress Notes (Signed)
Quick Note:  LMOM TCB x1 on pt's cell number. ______

## 2014-02-17 NOTE — Progress Notes (Signed)
Quick Note:  Called spoke with patient, discussed results/recs as stated by TP. CY's first available is not until 12/23. Pt stated that Dr [name] had recommended Dr Beatris Ship and will contact that office to see if they have a sooner opening. Pt is aware he may call back at any time if he decides to establish with our allergy clinic. ______

## 2014-03-01 NOTE — Addendum Note (Signed)
Addended by: Parke Poisson E on: 03/01/2014 01:20 PM   Modules accepted: Orders, Medications

## 2014-03-23 ENCOUNTER — Ambulatory Visit: Payer: BC Managed Care – PPO | Admitting: Internal Medicine

## 2014-03-26 ENCOUNTER — Encounter: Payer: Self-pay | Admitting: Internal Medicine

## 2014-03-26 ENCOUNTER — Ambulatory Visit (INDEPENDENT_AMBULATORY_CARE_PROVIDER_SITE_OTHER): Payer: BC Managed Care – PPO | Admitting: Internal Medicine

## 2014-03-26 ENCOUNTER — Other Ambulatory Visit (INDEPENDENT_AMBULATORY_CARE_PROVIDER_SITE_OTHER): Payer: BC Managed Care – PPO

## 2014-03-26 VITALS — BP 126/80 | HR 107 | Ht 67.0 in | Wt 211.0 lb

## 2014-03-26 DIAGNOSIS — J310 Chronic rhinitis: Secondary | ICD-10-CM

## 2014-03-26 DIAGNOSIS — H6120 Impacted cerumen, unspecified ear: Secondary | ICD-10-CM | POA: Insufficient documentation

## 2014-03-26 DIAGNOSIS — J45909 Unspecified asthma, uncomplicated: Secondary | ICD-10-CM

## 2014-03-26 DIAGNOSIS — H6123 Impacted cerumen, bilateral: Secondary | ICD-10-CM

## 2014-03-26 LAB — CBC WITH DIFFERENTIAL/PLATELET
Basophils Absolute: 0 10*3/uL (ref 0.0–0.1)
Basophils Relative: 0.5 % (ref 0.0–3.0)
Eosinophils Absolute: 0.2 10*3/uL (ref 0.0–0.7)
Eosinophils Relative: 3.3 % (ref 0.0–5.0)
HEMATOCRIT: 48.2 % (ref 39.0–52.0)
Hemoglobin: 16.1 g/dL (ref 13.0–17.0)
Lymphocytes Relative: 29.7 % (ref 12.0–46.0)
Lymphs Abs: 1.9 10*3/uL (ref 0.7–4.0)
MCHC: 33.4 g/dL (ref 30.0–36.0)
MCV: 88.1 fl (ref 78.0–100.0)
Monocytes Absolute: 0.6 10*3/uL (ref 0.1–1.0)
Monocytes Relative: 9.8 % (ref 3.0–12.0)
NEUTROS PCT: 56.7 % (ref 43.0–77.0)
Neutro Abs: 3.7 10*3/uL (ref 1.4–7.7)
Platelets: 222 10*3/uL (ref 150.0–400.0)
RBC: 5.48 Mil/uL (ref 4.22–5.81)
RDW: 13 % (ref 11.5–15.5)
WBC: 6.5 10*3/uL (ref 4.0–10.5)

## 2014-03-26 MED ORDER — ALBUTEROL SULFATE HFA 108 (90 BASE) MCG/ACT IN AERS: 2.0000 | INHALATION_SPRAY | RESPIRATORY_TRACT | Status: DC | PRN

## 2014-03-26 MED ORDER — MONTELUKAST SODIUM 10 MG PO TABS: 10.0000 mg | ORAL_TABLET | Freq: Every day | ORAL | Status: DC

## 2014-03-26 NOTE — Progress Notes (Signed)
Pt returned triage's call.  Mark Ward Subjective:     Patient ID: Mark Ward, male   DOB: 23-Sep-1965   MRN: [id number]  Brief patient profile:  89 yobm never smoker never sign allergies or asthma with new onset cough Feb 2014 variably improved then relapsed seen by ent x 3 by UC x3 2, x ER x 3 and finally admitted:  Admit date: 11/24/2012  Discharge date: 11/26/2012  Discharge Diagnoses:   Moderate persistent asthma with exacerbation    Diet recommendation: Regular.  Filed Weights    11/24/12 1856   Weight:  94.8 kg (208 lb 15.9 oz)   History of present illness:  48 y.o. male with with one-week of progressive SOB/Wheeze. He does not use any Albuterol, nor has he ever been formally diagnosed with Asthma he reports. He has had three recent ED visits for similar issues, given persistent symptoms, he presented to the ED for further evaluation and recommendations. Given Atrovent, Albuterol, Solumedrol, Magnesium, and admitted for further management.  Hospital Course:  Dyspnea/Chest tightness: Patient presented with SOB/Wheeze, of one week's duration, associated with a cough, productive of yellow phlegm, but no fever. CXR was devoid of pneumonic consolidation, and chest CT scan showed no active disease. Patient was recently treated with a nasal spray, for allergic rhinitis. He appears to have had acute bronchitis on this occasion, but as he has had three ED visits for SOB/wheeze in the past one month, and he appears to have underlying obstructive airway disease, although he has no smoking history. Clinically, he responded rather dramatically to bronchodilator nebulizers, steroids and Levaquin. On 11/26/12, he felt much better, and was very keen to be discharged. Outpatient pulmonary follow up has been set up for him on 12/03/12, with Dr Christinia Gully, pulmonologist, for evaluation, PFTs and follow up. He will conclude antibiotic therapy on 12/01/12. Oral steroid taper was prescribed on discharge.     12/03/12 1st pulmonary ov/ Mark Ward cc all better but still tapering prednisone.  rec Dulera 100 Take 2 puffs first thing in am and then another 2 puffs about 12 hours later.  Only use your albuterol (proventil) as needed  12/22/12 rx augmentin bid x 10 day plus prednisone    01/02/2013 f/u ov/Mark Ward    Chief Complaint  Patient presents with  . Followup with PFT    Pt reports breathing some better since last visit. He still c/o having green nasal d/c.  no  need for saba rec Dulera 100 Take 2 puffs first thing in am and then another 2 puffs about 12 hours later    02/03/2013 f/u ov/Mark Ward re:  asthma Chief Complaint  Patient presents with  . Follow-up    Breathing is much better. No new co's today. Has not needed proair since the last visit.  No saba at all, no more purulent secretions  rec Plan A = automatic = dulera 100 Take 2 puffs first thing in am and then another 2 puffs about 12 hours later and blow out through the nose  For breathing Plan B = Backup = proaire 2 puffs every 4 hours as needed  For congestion/cough PLan B is mucinex  The max dose is 1200 mg every 12h as needed For Itching sneezing Plan B Clariton 10 mg    05/12/2013 f/u ov/Mark Ward re: asthma in setting of chronic sinusitis Chief Complaint  Patient presents with  . Follow-up    Pt states having prod cough for the past 3-4 wks- yellow sputum. He also  c/o yelloe nasal d/c and wheezing. Was seen by ENT today and they rec surgery at this point to correct sinus issues.   Using saba maybe 3-4 x per week - wants phenergan with codeine for cough  rec As long as you have sinus problems you will have some lower resp symptoms as well so I would follow Dr [name] rec for surgery and in meantime: Plan A = automatic = dulera 100 Take 2 puffs first thing in am and then another 2 puffs about 12 hours later and blow out through the nose  For breathing Plan B = Backup = proaire 2 puffs every 4 hours as needed  For  congestion/cough PLan B is mucinex  The max dose is 1200 mg every 12h as neede For Itching sneezing Plan B Clariton 10 mg or Zyrtec   06/24/2013 f/u ov/Mark Ward re: sinusitis/ asthma Chief Complaint  Patient presents with  . Follow-up    Was seen in [facility] for SOB, coughing up blood.    Was fine as long as on dulera - not clear why stopped/ never contacted this office before going to ER Not clear now what meds he really has/ on multiple otcs/ much better since er on tapering prednisone. Not using albuterol now >>dulera, refer to ENT  07/09/2013 Folllow up and Med review  Returns for follow up and med review .  Reports breathing is doing well since last ov.  feels symptoms are more related to molds at work No SABA use.  Insurance will cover [employer].  Reviewed all his meds and organized them into a med calendar closely w/ pt education . Is following with ENT > sinus surgery 08/04/13 [name]   12/30/2013 f/u ov/Mark Ward re: asthma / not using med calendar provided  Chief Complaint  Patient presents with  . Follow-up    Pt states that his breathing is doing well. He has not had to use rescue inhaler since last visit.   variable nasal obst symptoms, not using nasal steroids on any kind of consistent basis. Overall much better since sinus surgery Working out at gym but mostly with wts, no aerobics, no need for saba  rec Change flonase to hs daily    01/21/2014 f/u ov/Mark Ward re: ? Asthma flare x one week  Chief Complaint  Patient presents with  . Acute Visit    Pt c/o increased cough "loud and piercing cough"- non prod. He also c/o wheezing- onset approx 1 wk ago. Has not used albuterol inhaler.   not using med calendar correclty esp action plans for saba and afrin  >> Prednisone 10 mg take  4 each am x 2 days,   2 each am x 2 days,  1 each am x 2 days and stop  Increase the flonase to twice daily     02/05/2014 Follow up and Med Review /NP History of asthma , and chronic sinus disease Patient  presents for a two-week followup for asthma exacerbation, and medication review. Patient was seen last visit, with an asthma flare. He was treated with a prednisone taper. Patient reports that symptoms did improve however, continued to have, nasal congestion, sinus pain, pressure, and drainage. Patient was seen by his ENT and was given a prescription for Levaquin, which he is currently on. Patient says coughing, congestion, and sinus pressure is improving. Patient denies any hemoptysis, orthopnea, PND, or leg swelling. We reviewed all his medications and organized them into a medication calendar with patient education. It appears that he is  taking his medications correctly. Patient does continue to get intermittent wheezing, but this is improved as well. Patient says that he has not been on Singulair in the past. He has been referred to an allergist by his ENT But has not made an appointment as of yet. rec Follow med calendar closely and bring to each visit. Continue on current regimen. Make sure to brush rinse and gargle after inhaler use. Continue followup with ENT as planned    03/26/2014 f/u ov/Thatiana Renbarger re: asthma/ chronic rhinitis/ sinusitis  Chief Complaint  Patient presents with  . Follow-up    Pt stats overall his breathing is doing well. He c/o stuffiness in right ear on and off since the last visit.    Still struggling with action plan/ multiple redundant decongestants/ did not see allergist as rec but breathing fine s need for saba day or noct and good activity tolerance though not doing aerobics- sinus symptoms worse at work  No obvious day to day or daytime variabilty or assoc chronic cough  or cp or chest tightness, subjective wheeze or overt  hb symptoms. No unusual exp hx or h/o childhood pna/ asthma or knowledge of premature birth.  Sleeping ok without nocturnal  or early am exacerbation  of respiratory  c/o's or need for noct saba. Also denies any obvious fluctuation of symptoms  with weather or environmental changes or other aggravating or alleviating factors except as outlined above   Current Medications, Allergies, Complete Past Medical History, Past Surgical History, Family History, and Social History were reviewed in [employer] record.  ROS  The following are not active complaints unless bolded sore throat, dysphagia, dental problems, itching, sneezing,  nasal congestion or excess/ purulent secretions, ear ache,   fever, chills, sweats, unintended wt loss, pleuritic or exertional cp, hemoptysis,  orthopnea pnd or leg swelling, presyncope, palpitations, heartburn, abdominal pain, anorexia, nausea, vomiting, diarrhea  or change in bowel or urinary habits, change in stools or urine, dysuria,hematuria,  rash, arthralgias, visual complaints, headache, numbness weakness or ataxia or problems with walking or coordination,  change in mood/affect or memory.                    Objective:   Physical Exam  Pleasant amb bm nad with mild  nasal tone to voice  01/02/2013  212  > 02/03/2013  215 > 05/12/2013 211 > 06/24/2013  212>210 07/09/13 > 12/30/2013  213 > 01/21/2014  208 >208 02/05/2014 > 03/26/2014 211    HEENT: nl dentition, and orophanx.  Ears fully impacted with wax bilaterally/ mild bilateral turbinate edema  NECK :  without JVD/Nodes/TM/ nl carotid upstrokes bilaterally   LUNGS: no acc muscle use, few trace wheezes    CV:  RRR  no s3 or murmur or increase in P2, no edema   ABD:  soft and nontender with nl excursion in the supine position. No bruits or organomegaly, bowel sounds nl  MS:  warm without deformities, calf tenderness, cyanosis or clubbing  SKIN: warm and dry without lesions         CXR  12/30/2013 : No evidence for acute cardiopulmonary abnormality       Assessment:

## 2014-03-26 NOTE — Patient Instructions (Addendum)
For nasal /ear stuffiness use afrin x 5 days and chase with flonase x 2 twice daily plus as needed advil col and sinus  Stop all other over the counters unless they are listed on your med calendar  Please remember to go to the lab   department downstairs for your tests - we will call you with the results when they are available.  Use the over the counter ear wax kit and if not improved see Dr [name] or [name] NP to have your ears washed out   Please schedule a follow up visit in 3 months but call sooner if needed    Late add: montelukast trial

## 2014-03-26 NOTE — Assessment & Plan Note (Signed)
-  PFT's 01/02/2013 wnl including fef 25-75 p am dulera 100 x 2 pffs - Med calendar 07/09/2013 > not using 12/30/2013 > not using action plans approp 01/21/2014 , 02/05/2014  - 12/30/2013 p extensive coaching HFA effectiveness =    90%    All goals of chronic asthma control met including optimal function and elimination of symptoms with minimal need for rescue therapy.  Contingencies discussed in full including contacting this office immediately if not controlling the symptoms using the rule of two's.

## 2014-03-26 NOTE — Assessment & Plan Note (Signed)
Noted 03/26/2014 > try otc's first

## 2014-03-26 NOTE — Assessment & Plan Note (Signed)
-   Augmentin x 20 days to be complete 01/12/13  -  Sinus CT 05/05/13 > Complete opacification of visualized portions of the frontal sinuses and ethmoid sinus air cells. No obvious intraorbital or intracranial extension. Moderate mucosal thickening/partial opacification sphenoid sinus air cells (left sphenoid sinus air cell is dominant). Polypoid moderate mucosal thickening maxillary sinuses. - rec ENT eval 05/06/2013 > surgery ([facility]) August 04 2013 > improved  Still bothered by nasal/ ear congestion but nothing to suggest infection - tends to overuse decongestants > tachycardis noted  Will add singulair trial and w/u for atopy and refer for formal allergy eval if Pos and not responding to singuliar/flonase rx     Each maintenance medication was reviewed in detail including most importantly the difference between maintenance and as needed and under what circumstances the prns are to be used. This was done in the context of a medication calendar review which provided the patient with a user-friendly unambiguous mechanism for medication administration and reconciliation and provides an action plan for all active problems. It is critical that this be shown to every doctor  for modification during the office visit if necessary so the patient can use it as a working document.

## 2014-03-29 LAB — ALLERGY FULL PROFILE
Allergen, D pternoyssinus,d7: <0.1 kU/L
Allergen,Goose feathers, e70: <0.1 kU/L
Bahia Grass: <0.1 kU/L
Bermuda Grass: <0.1 kU/L
Box Elder IgE: <0.1 kU/L
Cat Dander: <0.1 kU/L
Common Ragweed: <0.1 kU/L
Curvularia lunata: <0.1 kU/L
Dog Dander: <0.1 kU/L
Elm IgE: <0.1 kU/L
Fescue: <0.1 kU/L
G005 Rye, Perennial: <0.1 kU/L
G009 Red Top: <0.1 kU/L
Goldenrod: <0.1 kU/L
Helminthosporium halodes: <0.1 kU/L
House Dust Hollister: <0.1 kU/L
IGE (IMMUNOGLOBULIN E), SERUM: 77 kU/L (ref <115)
Plantain: <0.1 kU/L
Sycamore Tree: <0.1 kU/L

## 2014-03-29 NOTE — Progress Notes (Signed)
Quick Note:  LMTCB ______ 

## 2014-03-30 NOTE — Progress Notes (Signed)
Quick Note:  Pt returned call. Informed pt of the results and recs per MW. Pt verbalized understanding and denied any further questions or concerns at this time. ______

## 2014-05-07 ENCOUNTER — Telehealth: Payer: Self-pay | Admitting: Internal Medicine

## 2014-05-07 NOTE — Telephone Encounter (Signed)
LMTCB x 1 

## 2014-05-10 ENCOUNTER — Telehealth: Payer: Self-pay | Admitting: Internal Medicine

## 2014-05-10 MED ORDER — AZITHROMYCIN 250 MG PO TABS: ORAL_TABLET | ORAL | Status: DC

## 2014-05-10 MED ORDER — PREDNISONE 10 MG PO TABS: ORAL_TABLET | ORAL | Status: DC

## 2014-05-10 NOTE — Telephone Encounter (Signed)
Last OV 03/26/14. I spoke with the pt and he is c/o having chest congestion, productive cough with yellow phlegm x 3 weeks. He denies any wheezing or chest tightness. Offered pt an appt but he has to go to work at [employer] so refused appt. Please advise. [place] Bing, CMA  No Known Allergies

## 2014-05-10 NOTE — Telephone Encounter (Signed)
Called and spoke to pt's wife. Pt c/o sinus pressure and congestion, prod cough with yellow mucus, blowing out nasal mucus- yellow in color, and chest congestion x several days. Pt denies SOB, f/c/s and CP/tightness. Pt is requesting an abx. Pt is taking advil cold and sinus and robitussin. Pt last seen 03/26/14 by MW.   Dr. Melvyn Novas please advise.   No Known Allergies

## 2014-05-10 NOTE — Telephone Encounter (Signed)
zpak Prednisone 10 mg take  4 each am x 2 days,   2 each am x 2 days,  1 each am x 2 days and stop Ov with all meds in hand 2 weeks me or [name] NP

## 2014-05-10 NOTE — Telephone Encounter (Signed)
Already called in rx today though it looks like telephone note dated 03/08/15

## 2014-05-10 NOTE — Telephone Encounter (Signed)
LMOMTCB x 1 

## 2014-05-10 NOTE — Telephone Encounter (Signed)
rx sent and pt is aware. appt set. [place] Bing, CMA

## 2014-05-10 NOTE — Telephone Encounter (Signed)
Pt returned call - [phone]

## 2014-05-10 NOTE — Telephone Encounter (Signed)
Number went straight to VM >> LMOM TCB x2

## 2014-05-25 ENCOUNTER — Encounter: Payer: Self-pay | Admitting: Internal Medicine

## 2014-05-25 ENCOUNTER — Ambulatory Visit (INDEPENDENT_AMBULATORY_CARE_PROVIDER_SITE_OTHER): Payer: BLUE CROSS/BLUE SHIELD | Admitting: Internal Medicine

## 2014-05-25 VITALS — BP 154/84 | HR 120 | Ht 67.0 in | Wt 216.0 lb

## 2014-05-25 DIAGNOSIS — J4520 Mild intermittent asthma, uncomplicated: Secondary | ICD-10-CM

## 2014-05-25 DIAGNOSIS — J310 Chronic rhinitis: Secondary | ICD-10-CM

## 2014-05-25 MED ORDER — ALBUTEROL SULFATE HFA 108 (90 BASE) MCG/ACT IN AERS: INHALATION_SPRAY | RESPIRATORY_TRACT | Status: DC

## 2014-05-25 MED ORDER — BUDESONIDE-FORMOTEROL FUMARATE 160-4.5 MCG/ACT IN AERO: INHALATION_SPRAY | RESPIRATORY_TRACT | Status: DC

## 2014-05-25 NOTE — Progress Notes (Signed)
Pt returned triage's call.  Mark Ward Subjective:     Patient ID: Mark Ward, male   DOB: [birth year]   MRN: [id number]  Brief patient profile:  61 yobm never smoker never sign allergies or asthma with new onset cough Feb 2014 variably improved then relapsed seen by ent x 3 by UC x3 2, x ER x 3 and finally admitted:  Admit date: 11/24/2012  Discharge date: 11/26/2012  Discharge Diagnoses:   Moderate persistent asthma with exacerbation     07/09/2013 Folllow up and Med review  Returns for follow up and med review .  Reports breathing is doing well since last ov.  feels symptoms are more related to molds at work No SABA use.  Insurance will cover [employer].  Reviewed all his meds and organized them into a med calendar closely w/ pt education . Is following with ENT > sinus surgery 08/04/13 Mark Ward      01/21/2014 f/u ov/[name] re: ? Asthma flare x one week  Chief Complaint  Patient presents with  . Acute Visit    Pt c/o increased cough "loud and piercing cough"- non prod. He also c/o wheezing- onset approx 1 wk ago. Has not used albuterol inhaler.   not using med calendar correclty esp action plans for saba and afrin  >> Prednisone 10 mg take  4 each am x 2 days,   2 each am x 2 days,  1 each am x 2 days and stop  Increase the flonase to twice daily     02/05/2014 Follow up and Med Review /NP History of asthma , and chronic sinus disease Patient presents for a two-week followup for asthma exacerbation, and medication review. Patient was seen last visit, with an asthma flare. He was treated with a prednisone taper. Patient reports that symptoms did improve however, continued to have, nasal congestion, sinus pain, pressure, and drainage. Patient was seen by his ENT and was given a prescription for Levaquin, which he is currently on. Patient says coughing, congestion, and sinus pressure is improving. Patient denies any hemoptysis, orthopnea, PND, or leg swelling. We reviewed all his  medications and organized them into a medication calendar with patient education. It appears that he is taking his medications correctly. Patient does continue to get intermittent wheezing, but this is improved as well. Patient says that he has not been on Singulair in the past. He has been referred to an allergist by his ENT But has not made an appointment as of yet. rec Follow med calendar closely and bring to each visit. Continue on current regimen. Make sure to brush rinse and gargle after inhaler use. Continue followup with ENT as planned    03/26/2014 f/u ov/[name] re: asthma/ chronic rhinitis/ sinusitis  Chief Complaint  Patient presents with  . Follow-up    Pt stats overall his breathing is doing well. He c/o stuffiness in right ear on and off since the last visit.   Still struggling with action plan/ multiple redundant decongestants/ did not see allergist as rec but breathing fine s need for saba day or noct and good activity tolerance though not doing aerobics- sinus symptoms worse at work rec For nasal /ear stuffiness use afrin x 5 days and chase with flonase x 2 twice daily plus as needed advil col and sinus Stop all other over the counters unless they are listed on your med calendar Please remember to go to the lab   department downstairs for your tests - we will call  you with the results when they are available. Use the over the counter ear wax kit and if not improved see Dr Mark Ward or [name] NP to have your ears washed out  Please schedule a follow up visit in 3 months but call sooner if needed    Late add: montelukast trial    05/25/2014 f/u ov/[name] re: poorly controlled chronic asthma/ confused with instructions/ refills/ no med calendar   Chief Complaint  Patient presents with  . Follow-up    Pt states that his breathing is doing well. No new co's today. Has not needed rescue inhaler.   out of all controlling and rescue meds  dulera out 05/20/14 saba out x Nov  2015 Little stuffy in am/some cough > a little yellow mucus worse since ran out of singulair  Using lots of advil cold and sinus and afrin      No obvious other patterns in day to day or daytime variabilty or assoc  cp or chest tightness, subjective wheeze or overt  hb symptoms. No unusual exp hx or h/o childhood pna/ asthma or knowledge of premature birth.  Sleeping ok without nocturnal  or early am exacerbation  of respiratory  c/o's or need for noct saba. Also denies any obvious fluctuation of symptoms with weather or environmental changes or other aggravating or alleviating factors except as outlined above   Current Medications, Allergies, Complete Past Medical History, Past Surgical History, Family History, and Social History were reviewed in [employer] record.  ROS  The following are not active complaints unless bolded sore throat, dysphagia, dental problems, itching, sneezing,  nasal congestion or excess/ purulent secretions, ear ache,   fever, chills, sweats, unintended wt loss, pleuritic or exertional cp, hemoptysis,  orthopnea pnd or leg swelling, presyncope, palpitations, heartburn, abdominal pain, anorexia, nausea, vomiting, diarrhea  or change in bowel or urinary habits, change in stools or urine, dysuria,hematuria,  rash, arthralgias, visual complaints, headache, numbness weakness or ataxia or problems with walking or coordination,  change in mood/affect or memory.                    Objective:   Physical Exam  Pleasant amb bm nad with mild  nasal tone to voice/ very unusual affect, perseverating on why bp up today (vital signs reviewed)   01/02/2013  212  > 02/03/2013  215 > 05/12/2013 211 > 06/24/2013  212>210 07/09/13 > 12/30/2013  213 > 01/21/2014  208 >208 02/05/2014 > 03/26/2014 211 > 05/25/2014 216   HEENT: nl dentition, and orophanx.   mild bilateral turbinate edema  NECK :  without JVD/Nodes/TM/ nl carotid upstrokes bilaterally   LUNGS: no acc  muscle use, exp > insp sorous bilateral rhonchi   CV:  RRR  no s3 or murmur or increase in P2, no edema   ABD:  soft and nontender with nl excursion in the supine position. No bruits or organomegaly, bowel sounds nl  MS:  warm without deformities, calf tenderness, cyanosis or clubbing  SKIN: warm and dry without lesions         CXR  12/30/2013 : No evidence for acute cardiopulmonary abnormality       Assessment:

## 2014-05-25 NOTE — Patient Instructions (Signed)
symbicort 160 Take 2 puffs first thing in am and then another 2 puffs about 12 hours later and let us know if costs more than 25 dollars   Only use your albuterol as a rescue medication to be used if you can't catch your breath by resting or doing a relaxed purse lip breathing pattern.  - The less you use it, the better it will work when you need it. - Ok to use up to 2 puffs  every 4 hours if you must but call for immediate appointment if use goes up over your usual need - Don't leave home without it !!  (think of it like the spare tire for your car)   Continue singulair (montelukast) 10 mg each pm  I emphasized that nasal steroids have no immediate benefit in terms of improving symptoms.  To help them reached the target tissue, the patient should use Afrin two puffs every 12 hours applied one min before using the nasal steroids.  Afrin should be stopped after no more than 5 days.  If the symptoms worsen, Afrin can be restarted after 5 days off of therapy to prevent rebound congestion from overuse of Afrin.  I also emphasized that in no way are nasal steroids a concern in terms of "addiction".   Try to minimize the advil cold and sinus if you can   See [name] NP w/in 4 weeks with all your medications, even over the counter meds, separated in two separate bags, the ones you take no matter what vs the ones you stop once you feel better and take only as needed when you feel you need them.   [name]  will generate for you a new user friendly medication calendar that will put [place] all on the same page re: your medication use.     Without this process, it simply isn't possible to assure that we are providing  your outpatient care  with  the attention to detail we feel you deserve.   If we cannot assure that you're getting that kind of care,  then we cannot manage your problem effectively from this clinic.  Once you have seen [name] and we are sure that we're all on the same page with your medication use she  will arrange follow up with me.

## 2014-05-26 ENCOUNTER — Encounter: Payer: Self-pay | Admitting: Internal Medicine

## 2014-05-26 NOTE — Assessment & Plan Note (Signed)
-   Augmentin x 20 days to be complete 01/12/13  -  Sinus CT 05/05/13 > Complete opacification of visualized portions of the frontal sinuses and ethmoid sinus air cells. No obvious intraorbital or intracranial extension. Moderate mucosal thickening/partial opacification sphenoid sinus air cells (left sphenoid sinus air cell is dominant). Polypoid moderate mucosal thickening maxillary sinuses. - rec ENT eval 05/06/2013 > surgery [name]) August 04 2013 > improved - Singulair trial 03/26/2014 >>>  - Allergy profile 03/26/2014 > igE 77 with neg RAST  Reviewed Chronic rhinitis management and discouraged use of advil cold and sinus

## 2014-05-26 NOTE — Assessment & Plan Note (Addendum)
-   PFT's 01/02/2013 wnl including fef 25-75 p am dulera 100 x 2 pffs - Med calendar 07/09/2013 > not using 12/30/2013 > not using action plans approp 01/21/2014 , 02/05/2014  - 12/30/2013 p extensive coaching HFA effectiveness =    90%   Very poorly controlled asthma and high risk asthma death  I had an extended discussion with the patient reviewing all relevant studies completed to date and  lasting 15 to 20 minutes of a 25 minute visit on the following ongoing concerns:  1) unacceptable level of asthma control/ insight and poor adherence > high risk asthma morbidity and mortality  2) cost issues addressed A) ok to continue generic singulair B) change to symbicofrt 160 2bid with 25 dollar guarantee by [place]   3) return for trust but verify visit or we many need to consider discharging pt from clinic rather than accepting responsibility for the high risk situation he is creating.   See instructions for specific recommendations which were reviewed directly with the patient who was given a copy with highlighter outlining the key components.

## 2014-06-22 ENCOUNTER — Ambulatory Visit (INDEPENDENT_AMBULATORY_CARE_PROVIDER_SITE_OTHER): Payer: BLUE CROSS/BLUE SHIELD | Admitting: Adult Health

## 2014-06-22 ENCOUNTER — Encounter: Payer: Self-pay | Admitting: Adult Health

## 2014-06-22 VITALS — BP 140/82 | HR 108 | Temp 97.6°F | Ht 67.0 in | Wt 211.6 lb

## 2014-06-22 DIAGNOSIS — J4520 Mild intermittent asthma, uncomplicated: Secondary | ICD-10-CM

## 2014-06-22 DIAGNOSIS — J310 Chronic rhinitis: Secondary | ICD-10-CM

## 2014-06-22 NOTE — Patient Instructions (Signed)
Follow med calendar closely and bring to each visit. Continue on current regimen. Make sure to brush rinse and gargle after inhaler use. Follow up with Dr. Melvyn Novas  In 3 months and As needed

## 2014-06-22 NOTE — Progress Notes (Signed)
Pt returned triage's call.  [name] Subjective:     Patient ID: Mark Ward, male   DOB: [birth year]   MRN: [id number]  Brief patient profile:  61 yobm never smoker never sign allergies or asthma with new onset cough Feb 2014 variably improved then relapsed seen by ent x 3 by UC x3 2, x ER x 3 and finally admitted:  Admit date: 11/24/2012  Discharge date: 11/26/2012  Discharge Diagnoses:   Moderate persistent asthma with exacerbation     07/09/2013 Folllow up and Med review  Returns for follow up and med review .  Reports breathing is doing well since last ov.  feels symptoms are more related to molds at work No SABA use.  Insurance will cover [employer].  Reviewed all his meds and organized them into a med calendar closely w/ pt education . Is following with ENT > sinus surgery 08/04/13 Mark Ward      01/21/2014 f/u ov/[name] re: ? Asthma flare x one week  Chief Complaint  Patient presents with  . Acute Visit    Pt c/o increased cough "loud and piercing cough"- non prod. He also c/o wheezing- onset approx 1 wk ago. Has not used albuterol inhaler.   not using med calendar correclty esp action plans for saba and afrin  >> Prednisone 10 mg take  4 each am x 2 days,   2 each am x 2 days,  1 each am x 2 days and stop  Increase the flonase to twice daily     02/05/2014 Follow up and Med Review /NP History of asthma , and chronic sinus disease Patient presents for a two-week followup for asthma exacerbation, and medication review. Patient was seen last visit, with an asthma flare. He was treated with a prednisone taper. Patient reports that symptoms did improve however, continued to have, nasal congestion, sinus pain, pressure, and drainage. Patient was seen by his ENT and was given a prescription for Levaquin, which he is currently on. Patient says coughing, congestion, and sinus pressure is improving. Patient denies any hemoptysis, orthopnea, PND, or leg swelling. We reviewed all his  medications and organized them into a medication calendar with patient education. It appears that he is taking his medications correctly. Patient does continue to get intermittent wheezing, but this is improved as well. Patient says that he has not been on Singulair in the past. He has been referred to an allergist by his ENT But has not made an appointment as of yet. rec Follow med calendar closely and bring to each visit. Continue on current regimen. Make sure to brush rinse and gargle after inhaler use. Continue followup with ENT as planned    03/26/2014 f/u ov/[name] re: asthma/ chronic rhinitis/ sinusitis  Chief Complaint  Patient presents with  . Follow-up    Pt stats overall his breathing is doing well. He c/o stuffiness in right ear on and off since the last visit.   Still struggling with action plan/ multiple redundant decongestants/ did not see allergist as rec but breathing fine s need for saba day or noct and good activity tolerance though not doing aerobics- sinus symptoms worse at work rec For nasal /ear stuffiness use afrin x 5 days and chase with flonase x 2 twice daily plus as needed advil col and sinus Stop all other over the counters unless they are listed on your med calendar Please remember to go to the lab   department downstairs for your tests - we will call  you with the results when they are available. Use the over the counter ear wax kit and if not improved see Dr Byers or [name] NP to have your ears washed out  Please schedule a follow up visit in 3 months but call sooner if needed    Late add: montelukast trial    05/25/2014 f/u ov/[name] re: poorly controlled chronic asthma/ confused with instructions/ refills/ no med calendar   Chief Complaint  Patient presents with  . Follow-up    Pt states that his breathing is doing well. No new co's today. Has not needed rescue inhaler.   out of all controlling and rescue meds  dulera out 05/20/14 saba out x Nov  2015 Little stuffy in am/some cough > a little yellow mucus worse since ran out of singulair  Using lots of advil cold and sinus and afrin  >>change dulera to symbicort   06/22/2014 Follow up : Chronic Asthma and Med Review  Patient returns for a one-month follow-up . We reviewed all his medications organize them into a medication calendar with patient education  Appears to be taking correctly.  Patient was changed over from Dulera to Symbicort last visit  He says he feels that this is improved. His breathing.  He denies any chest pain, hemoptysis, orthopnea, PND, increased leg swelling, discolored mucus, fever, or increased Saba use    Current Medications, Allergies, Complete Past Medical History, Past Surgical History, Family History, and Social History were reviewed in [facility] electronic medical record.  ROS  The following are not active complaints unless bolded sore throat, dysphagia, dental problems, itching, sneezing,  nasal congestion or excess/ purulent secretions, ear ache,   fever, chills, sweats, unintended wt loss, pleuritic or exertional cp, hemoptysis,  orthopnea pnd or leg swelling, presyncope, palpitations, heartburn, abdominal pain, anorexia, nausea, vomiting, diarrhea  or change in bowel or urinary habits, change in stools or urine, dysuria,hematuria,  rash, arthralgias, visual complaints, headache, numbness weakness or ataxia or problems with walking or coordination,  change in mood/affect or memory.                    Objective:   Physical Exam  Pleasant amb bm nad    01/02/2013  212  > 02/03/2013  215 > 05/12/2013 211 > 06/24/2013  212>210 07/09/13 > 12/30/2013  213 > 01/21/2014  208 >208 02/05/2014 > 03/26/2014 211 > 05/25/2014 216>211 06/22/2014    HEENT: nl dentition, and orophanx.   mild bilateral turbinate edema  NECK :  without JVD/Nodes/TM/ nl carotid upstrokes bilaterally   LUNGS: no acc muscle use, CTA w/ no wheezing    CV:  RRR  no s3 or murmur  or increase in P2, no edema   ABD:  soft and nontender with nl excursion in the supine position. No bruits or organomegaly, bowel sounds nl  MS:  warm without deformities, calf tenderness, cyanosis or clubbing  SKIN: warm and dry without lesions         CXR  12/30/2013 : No evidence for acute cardiopulmonary abnormality       Assessment:                

## 2014-06-22 NOTE — Assessment & Plan Note (Signed)
Improved control with Singulair  Plan continue on current regimen

## 2014-06-22 NOTE — Assessment & Plan Note (Signed)
Improved control on Symbicort and Singulair Patient's medications were reviewed today and patient education was given. Computerized medication calendar was adjusted/completed  Plan  Continue on current regimen

## 2014-06-25 NOTE — Addendum Note (Signed)
Addended by: Parke Poisson E on: 06/25/2014 01:52 PM   Modules accepted: Orders, Medications

## 2014-11-13 IMAGING — CR DG CHEST 2V
2 series · 2 of 2 positions shown · non-contrast
Comparison: [11/04/2012]

CLINICAL DATA: Shortness of breath, congestion

CHEST - 2 VIEW

[w chest pa]
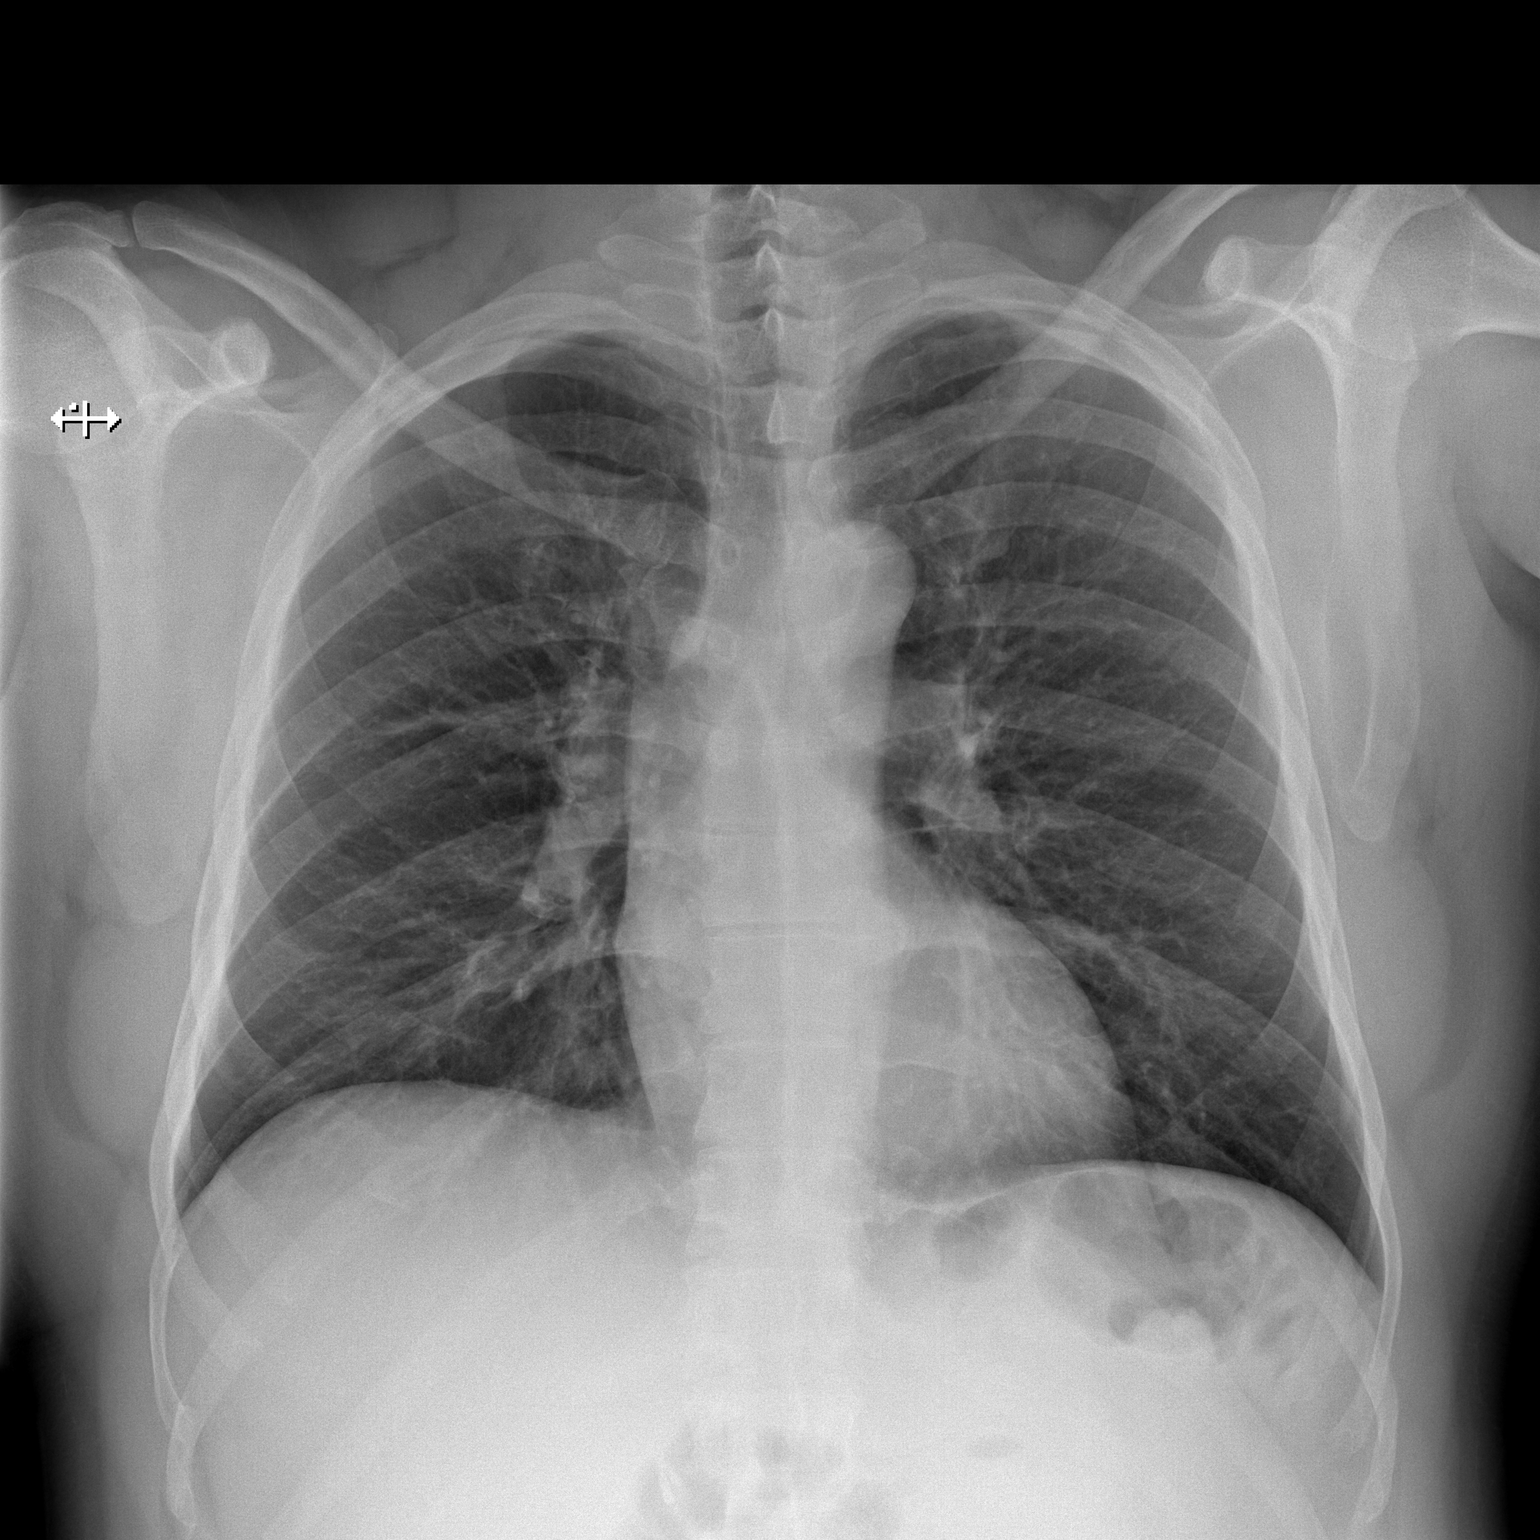

[w chest lat]
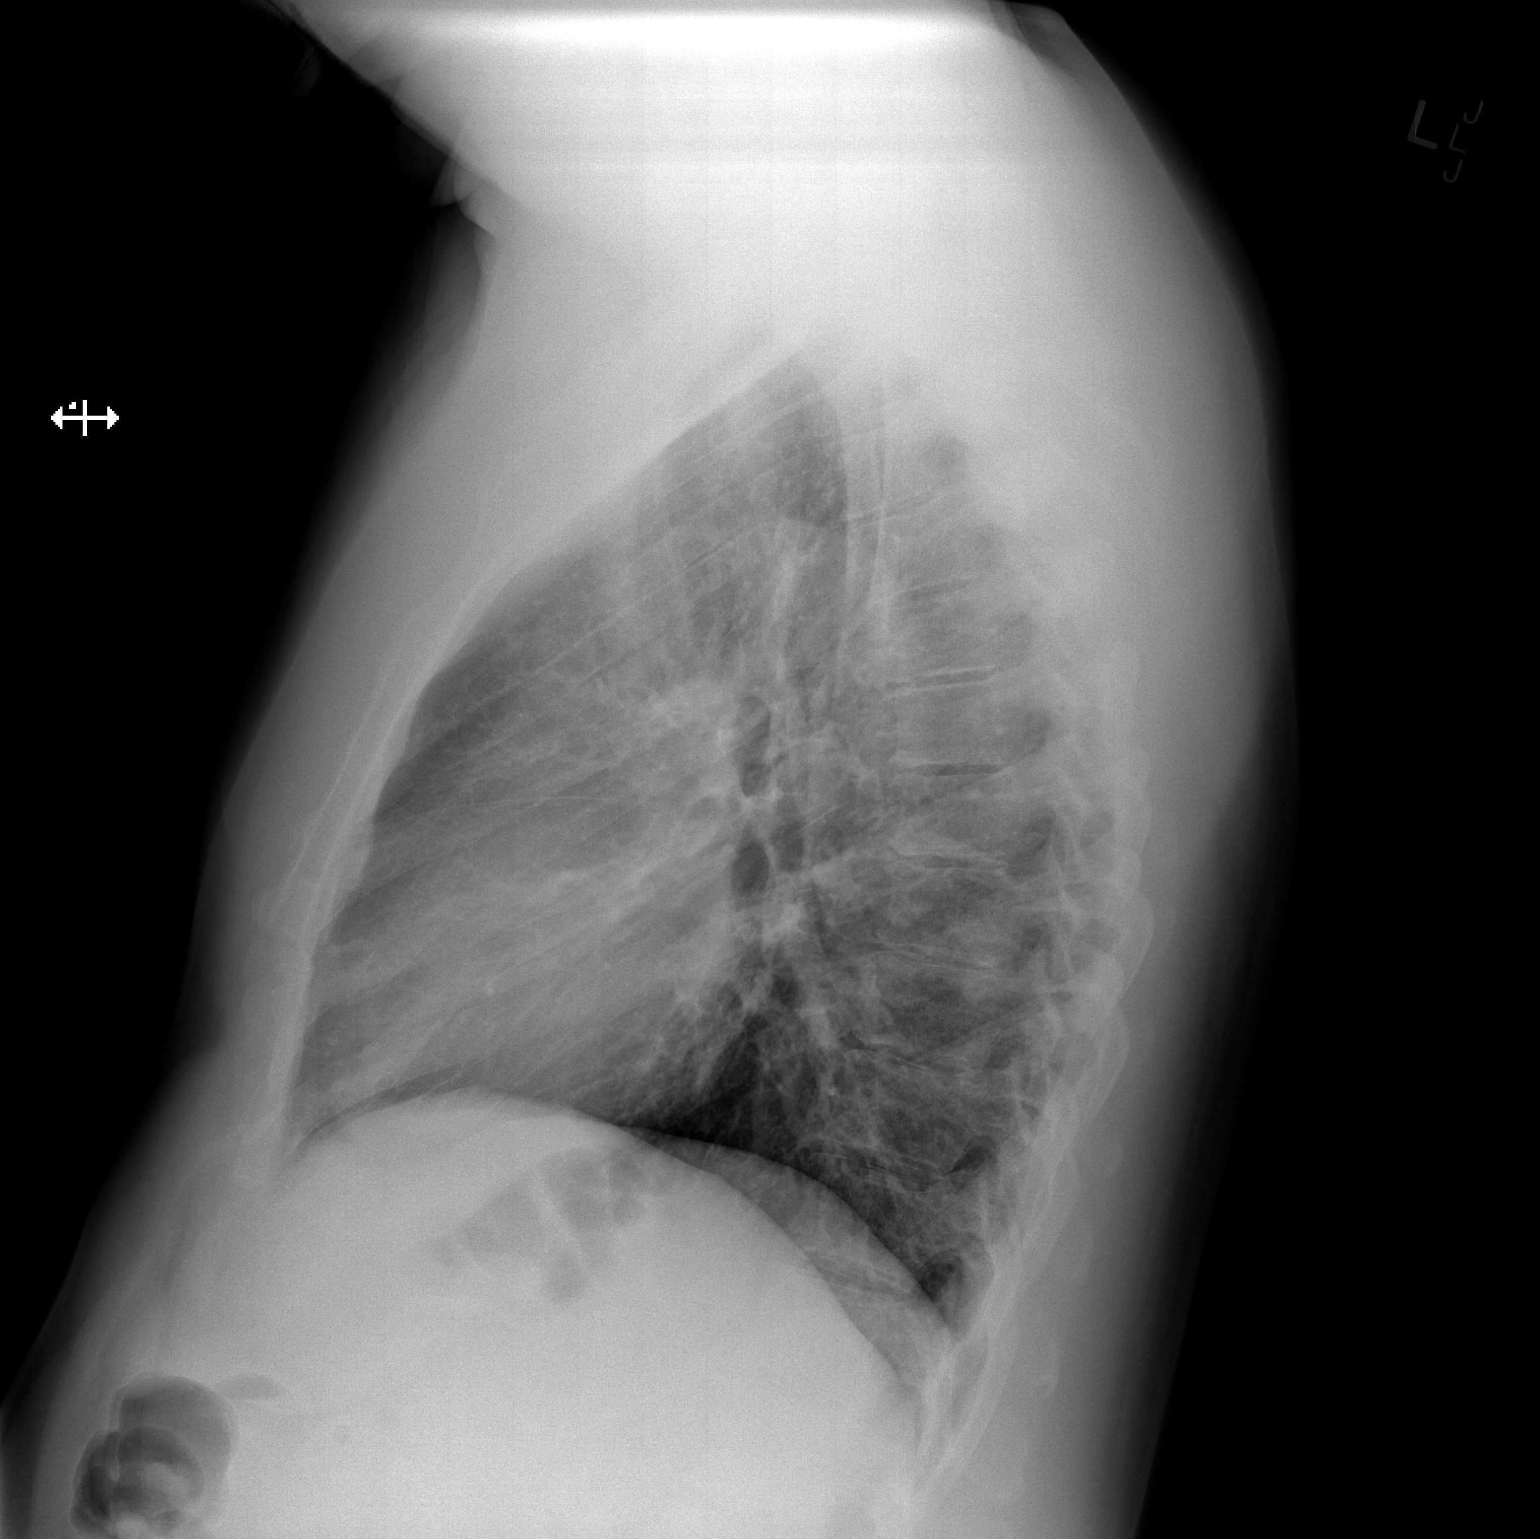

[2 of 2 positions shown; findings below may reference images not displayed]

FINDINGS: The heart size and vascular pattern are normal.  The
lungs are clear.  No change from prior study.
IMPRESSION: No acute findings

## 2014-12-13 ENCOUNTER — Telehealth: Payer: Self-pay | Admitting: Internal Medicine

## 2014-12-13 NOTE — Telephone Encounter (Signed)
Spoke with pt, requesting we mail symbicort patient assistance forms to his home.  Verified address on file.  Emphasized to pt that he is to return this form back to our office after filling it out so we can fax accordingly.  Nothing further needed at this time.

## 2014-12-20 ENCOUNTER — Telehealth: Payer: Self-pay | Admitting: Internal Medicine

## 2014-12-20 MED ORDER — BUDESONIDE-FORMOTEROL FUMARATE 160-4.5 MCG/ACT IN AERO: INHALATION_SPRAY | RESPIRATORY_TRACT | Status: DC

## 2014-12-20 NOTE — Telephone Encounter (Signed)
Pt dropped off [employer] patient assistance paperwork to be completed and faxed back.  Will give to Dr Melvyn Novas to sign Rx and application and will forward to [employer] to follow up when complete.

## 2014-12-20 NOTE — Telephone Encounter (Signed)
done

## 2014-12-21 NOTE — Telephone Encounter (Signed)
Forms completed and faxed along with 90 day supply rx for symbicort  LMOM for the pt to be made aware

## 2015-03-11 ENCOUNTER — Telehealth: Payer: Self-pay | Admitting: Internal Medicine

## 2015-03-11 MED ORDER — AZITHROMYCIN 250 MG PO TABS: ORAL_TABLET | ORAL | Status: DC

## 2015-03-11 NOTE — Telephone Encounter (Signed)
Add on to mine any day

## 2015-03-11 NOTE — Telephone Encounter (Signed)
zpak > ov next week with all meds in hand

## 2015-03-11 NOTE — Telephone Encounter (Signed)
Patient says that yesterday, he had sinus headache, nasal passage swollen.  Yellow/green liquid coming out of nose.  Chills, fever.    [facility]  No Known Allergies

## 2015-03-11 NOTE — Telephone Encounter (Signed)
Patient returned call, [phone].

## 2015-03-11 NOTE — Telephone Encounter (Addendum)
Left message on pt's cell phone and home number to return our call.

## 2015-03-11 NOTE — Telephone Encounter (Signed)
Patient notified of Dr. Gustavus Althouse recommendations. Dr. Melvyn Novas, please advise where to put patient on schedule next week.

## 2015-03-11 NOTE — Telephone Encounter (Signed)
Left message to call back  

## 2015-03-14 NOTE — Telephone Encounter (Signed)
lmtcb x1 for pt. 

## 2015-03-15 NOTE — Telephone Encounter (Signed)
Called and spoke to pt's mother. Appt made with MW on 03/18/15. Pt's mother verbalized understanding and denied any further questions or concerns at this time.

## 2015-03-18 ENCOUNTER — Encounter: Payer: Self-pay | Admitting: Internal Medicine

## 2015-03-18 ENCOUNTER — Ambulatory Visit (INDEPENDENT_AMBULATORY_CARE_PROVIDER_SITE_OTHER): Payer: BLUE CROSS/BLUE SHIELD | Admitting: Internal Medicine

## 2015-03-18 VITALS — BP 134/84 | HR 119 | Temp 97.7°F | Ht 67.0 in | Wt 212.2 lb

## 2015-03-18 DIAGNOSIS — Z23 Encounter for immunization: Secondary | ICD-10-CM | POA: Diagnosis not present

## 2015-03-18 DIAGNOSIS — J45901 Unspecified asthma with (acute) exacerbation: Secondary | ICD-10-CM | POA: Diagnosis not present

## 2015-03-18 DIAGNOSIS — J310 Chronic rhinitis: Secondary | ICD-10-CM | POA: Diagnosis not present

## 2015-03-18 DIAGNOSIS — Z6832 Body mass index (BMI) 32.0-32.9, adult: Secondary | ICD-10-CM | POA: Insufficient documentation

## 2015-03-18 DIAGNOSIS — E669 Obesity, unspecified: Secondary | ICD-10-CM

## 2015-03-18 MED ORDER — AMOXICILLIN-POT CLAVULANATE 875-125 MG PO TABS: 1.0000 | ORAL_TABLET | Freq: Two times a day (BID) | ORAL | Status: DC

## 2015-03-18 MED ORDER — PREDNISONE 10 MG PO TABS: ORAL_TABLET | ORAL | Status: DC

## 2015-03-18 NOTE — Progress Notes (Signed)
Pt returned triage's call.  Mark Ward Subjective:     Patient ID: Mark Ward, male   DOB: [birth year]   MRN: [id number]  Brief patient profile:  61 yobm never smoker never sign allergies or asthma with new onset cough Feb 2014 variably improved then relapsed seen by ent x 3 by UC x3 2, x ER x 3 and finally admitted:  Admit date: 11/24/2012  Discharge date: 11/26/2012  Discharge Diagnoses:   Moderate persistent asthma with exacerbation     07/09/2013 Folllow up and Med review  Returns for follow up and med review .  Reports breathing is doing well since last ov.  feels symptoms are more related to molds at work No SABA use.  Insurance will cover [employer].  Reviewed all his meds and organized them into a med calendar closely w/ pt education . Is following with ENT > sinus surgery 08/04/13 Mark Ward      01/21/2014 f/u ov/Mark Ward re: ? Asthma flare x one week  Chief Complaint  Patient presents with  . Acute Visit    Pt c/o increased cough "loud and piercing cough"- non prod. He also c/o wheezing- onset approx 1 wk ago. Has not used albuterol inhaler.   not using med calendar correclty esp action plans for saba and afrin  >> Prednisone 10 mg take  4 each am x 2 days,   2 each am x 2 days,  1 each am x 2 days and stop  Increase the flonase to twice daily     02/05/2014 Follow up and Med Review /NP History of asthma , and chronic sinus disease Patient presents for a two-week followup for asthma exacerbation, and medication review. Patient was seen last visit, with an asthma flare. He was treated with a prednisone taper. Patient reports that symptoms did improve however, continued to have, nasal congestion, sinus pain, pressure, and drainage. Patient was seen by his ENT and was given a prescription for Levaquin, which he is currently on. Patient says coughing, congestion, and sinus pressure is improving. Patient denies any hemoptysis, orthopnea, PND, or leg swelling. We reviewed all his  medications and organized them into a medication calendar with patient education. It appears that he is taking his medications correctly. Patient does continue to get intermittent wheezing, but this is improved as well. Patient says that he has not been on Singulair in the past. He has been referred to an allergist by his ENT But has not made an appointment as of yet. rec Follow med calendar closely and bring to each visit. Continue on current regimen. Make sure to brush rinse and gargle after inhaler use. Continue followup with ENT as planned    03/26/2014 f/u ov/Mark Ward re: asthma/ chronic rhinitis/ sinusitis  Chief Complaint  Patient presents with  . Follow-up    Pt stats overall his breathing is doing well. He c/o stuffiness in right ear on and off since the last visit.   Still struggling with action plan/ multiple redundant decongestants/ did not see allergist as rec but breathing fine s need for saba day or noct and good activity tolerance though not doing aerobics- sinus symptoms worse at work rec For nasal /ear stuffiness use afrin x 5 days and chase with flonase x 2 twice daily plus as needed advil col and sinus Stop all other over the counters unless they are listed on your med calendar Please remember to go to the lab   department downstairs for your tests - we will call  you with the results when they are available. Use the over the counter ear wax kit and if not improved see Dr Mark Ward or [name] NP to have your ears washed out  Please schedule a follow up visit in 3 months but call sooner if needed    Late add: montelukast trial    05/25/2014 f/u ov/Mark Ward re: poorly controlled chronic asthma/ confused with instructions/ refills/ no med calendar   Chief Complaint  Patient presents with  . Follow-up    Pt states that his breathing is doing well. No new co's today. Has not needed rescue inhaler.   out of all controlling and rescue meds  dulera out 05/20/14 saba out x Nov  2015 Little stuffy in am/some cough > a little yellow mucus worse since ran out of singulair  Using lots of advil cold and sinus and afrin  >>change dulera to symbicort   06/22/2014 Follow up : Chronic Asthma and Med Review  Patient returns for a one-month follow-up . We reviewed all his medications organize them into a medication calendar with patient education  Appears to be taking correctly.  Patient was changed over from [facility] to Symbicort last visit  He says he feels that this is improved. His breathing.  He denies any chest pain, hemoptysis, orthopnea, PND, increased leg swelling, discolored mucus, fever, or increased Saba use rec Follow med calendar closely and bring to each visit. Continue on current regimen. Make sure to brush rinse and gargle after inhaler use.   03/18/2015  f/u ov/Mark Ward re: asthma/ no med calendar  Chief Complaint  Patient presents with  . Acute Visit    Pt c/o nasal congestion and cough for the past 10 days. Cough is prod with minimal yellow sputum and also has some yellow nasal d/c.     Some coughing just at work on good days, much worse x 10 days assoc with head cold no better p zpak called in  Rarely using saba even with this flare   No obvious day to day or daytime variability or assoc   cp or chest tightness, subjective wheeze or overt sinus or hb symptoms. No unusual exp hx or h/o childhood pna/ asthma or knowledge of premature birth.  Sleeping ok without nocturnal  or early am exacerbation  of respiratory  c/o's or need for noct saba. Also denies any obvious fluctuation of symptoms with weather or environmental changes or other aggravating or alleviating factors except as outlined above   Current Medications, Allergies, Complete Past Medical History, Past Surgical History, Family History, and Social History were reviewed in [employer] record.  ROS  The following are not active complaints unless bolded sore throat, dysphagia,  dental problems, itching, sneezing,  nasal congestion or excess/ purulent secretions, ear ache,   fever, chills, sweats, unintended wt loss, classically pleuritic or exertional cp, hemoptysis,  orthopnea pnd or leg swelling, presyncope, palpitations, abdominal pain, anorexia, nausea, vomiting, diarrhea  or change in bowel or bladder habits, change in stools or urine, dysuria,hematuria,  rash, arthralgias, visual complaints, headache, numbness, weakness or ataxia or problems with walking or coordination,  change in mood/affect or memory.               Objective:   Physical Exam  Pleasant amb bm nad    01/02/2013  212  > 02/03/2013  215 > 05/12/2013 211 > 06/24/2013  212>210 07/09/13 > 12/30/2013  213 > 01/21/2014  208 >208 02/05/2014 > 03/26/2014 211 > 05/25/2014 216>211  06/22/2014 > 212 03/18/2015    HEENT: nl dentition, and orophanx.  Mod nonspecifics bilateral turbinate edema  NECK :  without JVD/Nodes/TM/ nl carotid upstrokes bilaterally   LUNGS: no acc muscle use, bilateral mid exp wheezing    CV:  RRR  no s3 or murmur or increase in P2, no edema   ABD:  soft and nontender with nl excursion in the supine position. No bruits or organomegaly, bowel sounds nl  MS:  warm without deformities, calf tenderness, cyanosis or clubbing  SKIN: warm and dry without lesions            Assessment:

## 2015-03-18 NOTE — Assessment & Plan Note (Signed)
Body mass index is 33.23    Lab Results  Component Value Date   TSH 0.262* 11/24/2012     Contributing to gerd tendency/ doe/reviewed the need and the process to achieve and maintain neg calorie balance > defer f/u primary care including intermittently monitoring thyroid status

## 2015-03-18 NOTE — Assessment & Plan Note (Signed)
DDX of  difficult airways management all start with A and  include Adherence, Ace Inhibitors, Acid Reflux, Active Sinus Disease, Alpha 1 Antitripsin deficiency, Anxiety masquerading as Airways dz,  ABPA,  allergy(esp in young), Aspiration (esp in elderly), Adverse effects of meds,  Active smokers, A bunch of PE's (a small clot burden can't cause this syndrome unless there is already severe underlying pulm or vascular dz with poor reserve) plus two Bs  = Bronchiectasis and Beta blocker use..and one C= CHF  Adherence is always the initial "prime suspect" and is a multilayered concern that requires a "trust but verify" approach in every patient - starting with knowing how to use medications, especially inhalers, correctly, keeping up with refills and understanding the fundamental difference between maintenance and prns vs those medications only taken for a very short course and then stopped and not refilled.  - not using med calendar or action plan correctly  - The proper method of use, as well as anticipated side effects, of a metered-dose inhaler are discussed and demonstrated to the patient. Improved effectiveness after extensive coaching during this visit to a level of approximately  90%    ? Allergy/ Prednisone 10 mg take  4 each am x 2 days,   2 each am x 2 days,  1 each am x 2 days and stop  ? Active sinus dz > augmentin x 10 days then sinus ct if not better  I had an extended discussion with the patient reviewing all relevant studies completed to date and  lasting 15 to 20 minutes of a 25 minute visit    Each maintenance medication was reviewed in detail including most importantly the difference between maintenance and prns and under what circumstances the prns are to be triggered using an action plan format that is not reflected in the computer generated alphabetically organized AVS but trather by a customized med calendar that reflects the AVS meds with confirmed 100% correlation.   Please see  instructions for details which were reviewed in writing and the patient given a copy highlighting the part that I personally wrote and discussed at today's ov.

## 2015-03-18 NOTE — Assessment & Plan Note (Signed)
-   Augmentin x 20 days to be complete 01/12/13  -  Sinus CT 05/05/13 > Complete opacification of visualized portions of the frontal sinuses and ethmoid sinus air cells. No obvious intraorbital or intracranial extension. Moderate mucosal thickening/partial opacification sphenoid sinus air cells (left sphenoid sinus air cell is dominant). Polypoid moderate mucosal thickening maxillary sinuses. - rec ENT eval 05/06/2013 > surgery [name]) August 04 2013 > improved - Singulair trial 03/26/2014 >>>  - Allergy profile 03/26/2014 > igE 79 with neg RAST   Reviewed again approp rx with afrin/flonase

## 2015-03-18 NOTE — Patient Instructions (Signed)
Use afrin x 5 days sev minutes  before flonase and only before the flonase - ok to resume if 5 days off afrin   Work on inhaler technique:  relax and gently blow all the way out then take a nice smooth deep breath back in, triggering the inhaler at same time you start breathing in.  Hold for up to 5 seconds if you can. Blow out thru nose. Rinse and gargle with water when done   Augmentin 875 mg take one pill twice daily  X 10 days - take at breakfast and supper with large glass of water.  It would help reduce the usual side effects (diarrhea and yeast infections) if you ate cultured yogurt at lunch.   Prednisone 10 mg take  4 each am x 2 days,   2 each am x 2 days,  1 each am x 2 days and stop    If you are satisfied with your treatment plan,  let your doctor know and he/she can either refill your medications or you can return here when your prescription runs out.     If in any way you are not 100% satisfied,  please tell us.  If 100% better, tell your friends!  Pulmonary follow up is as needed

## 2015-03-24 ENCOUNTER — Other Ambulatory Visit: Payer: Self-pay | Admitting: Internal Medicine

## 2015-06-22 ENCOUNTER — Other Ambulatory Visit: Payer: Self-pay | Admitting: Internal Medicine

## 2015-09-09 ENCOUNTER — Encounter: Payer: Self-pay | Admitting: Internal Medicine

## 2015-09-09 ENCOUNTER — Ambulatory Visit (INDEPENDENT_AMBULATORY_CARE_PROVIDER_SITE_OTHER): Payer: 59 | Admitting: Internal Medicine

## 2015-09-09 VITALS — BP 150/90 | HR 102 | Temp 98.4°F | Ht 67.0 in | Wt 214.6 lb

## 2015-09-09 DIAGNOSIS — J310 Chronic rhinitis: Secondary | ICD-10-CM | POA: Diagnosis not present

## 2015-09-09 DIAGNOSIS — J45901 Unspecified asthma with (acute) exacerbation: Secondary | ICD-10-CM | POA: Diagnosis not present

## 2015-09-09 MED ORDER — AMOXICILLIN-POT CLAVULANATE 875-125 MG PO TABS: 1.0000 | ORAL_TABLET | Freq: Two times a day (BID) | ORAL | Status: DC

## 2015-09-09 MED ORDER — PREDNISONE 10 MG PO TABS: ORAL_TABLET | ORAL | Status: DC

## 2015-09-09 NOTE — Assessment & Plan Note (Signed)
-   Augmentin x 20 days to be complete 01/12/13  -  Sinus CT 05/05/13 > Complete opacification of visualized portions of the frontal sinuses and ethmoid sinus air cells. No obvious intraorbital or intracranial extension. Moderate mucosal thickening/partial opacification sphenoid sinus air cells (left sphenoid sinus air cell is dominant). Polypoid moderate mucosal thickening maxillary sinuses. - rec ENT eval 05/06/2013 > surgery [name]) August 04 2013 > improved - Singulair trial 03/26/2014 >>>  - Allergy profile 03/26/2014 > igE 77 with neg RAST   Advised against advil cold and sinus with borderline hbp and advised afrin/ flonase/ reviewed

## 2015-09-09 NOTE — Progress Notes (Signed)
Pt returned triage's call.  Mark Ward Subjective:     Patient ID: Mark Ward, male   DOB: [birth year]   MRN: [id number]  Brief patient profile:  61 yobm never smoker never sign allergies or asthma with new onset cough Feb 2014 variably improved then relapsed seen by ent x 3 by UC x3 2, x ER x 3 and finally admitted:  Admit date: 11/24/2012  Discharge date: 11/26/2012  Discharge Diagnoses:   Moderate persistent asthma with exacerbation     07/09/2013 Folllow up and Med review  Returns for follow up and med review .  Reports breathing is doing well since last ov.  feels symptoms are more related to molds at work No SABA use.  Insurance will cover [employer].  Reviewed all his meds and organized them into a med calendar closely w/ pt education . Is following with ENT > sinus surgery 08/04/13 Mark Ward      01/21/2014 f/u ov/Mark Ward re: ? Asthma flare x one week  Chief Complaint  Patient presents with  . Acute Visit    Pt c/o increased cough "loud and piercing cough"- non prod. He also c/o wheezing- onset approx 1 wk ago. Has not used albuterol inhaler.   not using med calendar correclty esp action plans for saba and afrin  >> Prednisone 10 mg take  4 each am x 2 days,   2 each am x 2 days,  1 each am x 2 days and stop  Increase the flonase to twice daily     02/05/2014 Follow up and Med Review /NP History of asthma , and chronic sinus disease Patient presents for a two-week followup for asthma exacerbation, and medication review. Patient was seen last visit, with an asthma flare. He was treated with a prednisone taper. Patient reports that symptoms did improve however, continued to have, nasal congestion, sinus pain, pressure, and drainage. Patient was seen by his ENT and was given a prescription for Levaquin, which he is currently on. Patient says coughing, congestion, and sinus pressure is improving. Patient denies any hemoptysis, orthopnea, PND, or leg swelling. We reviewed all his  medications and organized them into a medication calendar with patient education. It appears that he is taking his medications correctly. Patient does continue to get intermittent wheezing, but this is improved as well. Patient says that he has not been on Singulair in the past. He has been referred to an allergist by his ENT But has not made an appointment as of yet. rec Follow med calendar closely and bring to each visit. Continue on current regimen. Make sure to brush rinse and gargle after inhaler use. Continue followup with ENT as planned    03/26/2014 f/u ov/Mark Ward re: asthma/ chronic rhinitis/ sinusitis  Chief Complaint  Patient presents with  . Follow-up    Pt stats overall his breathing is doing well. He c/o stuffiness in right ear on and off since the last visit.   Still struggling with action plan/ multiple redundant decongestants/ did not see allergist as rec but breathing fine s need for saba day or noct and good activity tolerance though not doing aerobics- sinus symptoms worse at work rec For nasal /ear stuffiness use afrin x 5 days and chase with flonase x 2 twice daily plus as needed advil col and sinus Stop all other over the counters unless they are listed on your med calendar Please remember to go to the lab   department downstairs for your tests - we will call  you with the results when they are available. Use the over the counter ear wax kit and if not improved see Dr Mark Ward or [name] NP to have your ears washed out  Please schedule a follow up visit in 3 months but call sooner if needed    Late add: montelukast trial    05/25/2014 f/u ov/Mark Ward re: poorly controlled chronic asthma/ confused with instructions/ refills/ no med calendar   Chief Complaint  Patient presents with  . Follow-up    Pt states that his breathing is doing well. No new co's today. Has not needed rescue inhaler.   out of all controlling and rescue meds  dulera out 05/20/14 saba out x Nov  2015 Little stuffy in am/some cough > a little yellow mucus worse since ran out of singulair  Using lots of advil cold and sinus and afrin  >>change dulera to symbicort   06/22/2014 Follow up : Chronic Asthma and Med Review  Patient returns for a one-month follow-up . We reviewed all his medications organize them into a medication calendar with patient education  Appears to be taking correctly.  Patient was changed over from [facility] to Symbicort last visit  He says he feels that this is improved. His breathing.  He denies any chest pain, hemoptysis, orthopnea, PND, increased leg swelling, discolored mucus, fever, or increased Saba use rec Follow med calendar closely and bring to each visit. Continue on current regimen. Make sure to brush rinse and gargle after inhaler use.   03/18/2015  f/u ov/Mark Ward re: asthma/ no med calendar  Chief Complaint  Patient presents with  . Acute Visit    Pt c/o nasal congestion and cough for the past 10 days. Cough is prod with minimal yellow sputum and also has some yellow nasal d/c.     Some coughing just at work on good days, much worse x 10 days assoc with head cold no better p zpak called in  Rarely using saba even with this flare  rec Use afrin x 5 days sev minutes  before flonase and only before the flonase - ok to resume if 5 days off afrin  Work on inhaler technique:  Augmentin 875 mg take one pill twice daily  X 10 days - take at breakfast and supper with large glass of water.  Prednisone 10 mg take  4 each am x 2 days,   2 each am x 2 days,  1 each am x 2 days and stop    09/09/2015 acute extended ov/Mark Ward re: aeAsthma on symb160/flonase [name] Chief Complaint  Patient presents with  . Acute Visit    Pt c/o sore throat x 10 days. He also c/o cough with green sputum and nasal congestion.    not using saba at all at baseline and doing great on above maint rx then acute onset sore throat/ cough/ purulent nasal secretions and harsh cough x 10  days> still not using saba and does not remember when/why to use it     No obvious day to day or daytime variability or assoc   cp or chest tightness, subjective wheeze or overt sinus or hb symptoms. No unusual exp hx or h/o childhood pna/ asthma or knowledge of premature birth.  Sleeping ok without nocturnal  or early am exacerbation  of respiratory  c/o's or need for noct saba. Also denies any obvious fluctuation of symptoms with weather or environmental changes or other aggravating or alleviating factors except as outlined above   Current  Medications, Allergies, Complete Past Medical History, Past Surgical History, Family History, and Social History were reviewed in [employer] record.  ROS  The following are not active complaints unless bolded sore throat, dysphagia, dental problems, itching, sneezing,  nasal congestion or excess/ purulent secretions, ear ache,   fever, chills, sweats, unintended wt loss, classically pleuritic or exertional cp, hemoptysis,  orthopnea pnd or leg swelling, presyncope, palpitations, abdominal pain, anorexia, nausea, vomiting, diarrhea  or change in bowel or bladder habits, change in stools or urine, dysuria,hematuria,  rash, arthralgias, visual complaints, headache, numbness, weakness or ataxia or problems with walking or coordination,  change in mood/affect or memory.        Objective:   Physical Exam  Pleasant amb bm nad    01/02/2013  212  > 02/03/2013  215 > 05/12/2013 211 > 06/24/2013  212>210 07/09/13 > 12/30/2013  213 > 01/21/2014  208 >208 02/05/2014 > 03/26/2014 211 > 05/25/2014 216>211 06/22/2014 > 212 03/18/2015 >  09/09/2015   215    HEENT: nl dentition, and orophanx.  Mod nonspecifics bilateral turbinate edema  NECK :  without JVD/Nodes/TM/ nl carotid upstrokes bilaterally   LUNGS: no acc muscle use, minimal insp and exp rhonchi     CV:  RRR  no s3 or murmur or increase in P2, no edema   ABD:  soft and nontender with nl  excursion in the supine position. No bruits or organomegaly, bowel sounds nl  MS:  warm without deformities, calf tenderness, cyanosis or clubbing  SKIN: warm and dry without lesions               Assessment:

## 2015-09-09 NOTE — Assessment & Plan Note (Signed)
-  PFT's 01/02/2013 wnl including fef 25-75 p am dulera 100 x 2 pffs - Med calendar 07/09/2013 > not using 12/30/2013 > not using action plans approp 01/21/2014 , 02/05/2014 , 06/22/2014  - 09/09/2015 p extensive coaching HFA effectiveness =    90%   All goals of chronic asthma control met including optimal function and elimination of symptoms with minimal need for rescue therapy despite acute flare of symptoms with URI/ rhinitis/ ? Sinusitis and bronchitis   rec rx augmentin x 10 days/ Prednisone 10 mg take  4 each am x 2 days,   2 each am x 2 days,  1 each am x 2 days and stop and no change maint rx  I had an extended discussion with the patient reviewing all relevant studies completed to date and  lasting 15 to 20 minutes of a 25 minute visit    Each maintenance medication was reviewed in detail including most importantly the difference between maintenance and prns and under what circumstances the prns are to be triggered using an action plan format that is not reflected in the computer generated alphabetically organized AVS.    Please see instructions for details which were reviewed in writing and the patient given a copy highlighting the part that I personally wrote and discussed at today's ov.     

## 2015-09-09 NOTE — Patient Instructions (Signed)
Work on inhaler technique:  relax and gently blow all the way out then take a nice smooth deep breath back in, triggering the inhaler at same time you start breathing in.  Hold for up to 5 seconds if you can. Blow out thru nose. Rinse and gargle with water when done     Augmentin 875 mg take one pill twice daily  X 10 days - take at breakfast and supper with large glass of water.  It would help reduce the usual side effects (diarrhea and yeast infections) if you ate cultured yogurt at lunch.   Prednisone 10 mg take  4 each am x 2 days,   2 each am x 2 days,  1 each am x 2 days and stop    Please schedule a follow up visit in 3 months but call sooner if needed

## 2015-10-20 ENCOUNTER — Other Ambulatory Visit: Payer: Self-pay | Admitting: Internal Medicine

## 2015-12-12 ENCOUNTER — Encounter: Payer: Self-pay | Admitting: Internal Medicine

## 2015-12-12 ENCOUNTER — Ambulatory Visit (INDEPENDENT_AMBULATORY_CARE_PROVIDER_SITE_OTHER): Payer: 59 | Admitting: Internal Medicine

## 2015-12-12 ENCOUNTER — Telehealth: Payer: Self-pay | Admitting: Internal Medicine

## 2015-12-12 VITALS — BP 146/78 | HR 107 | Ht 67.0 in | Wt 211.6 lb

## 2015-12-12 DIAGNOSIS — J310 Chronic rhinitis: Secondary | ICD-10-CM

## 2015-12-12 DIAGNOSIS — J45909 Unspecified asthma, uncomplicated: Secondary | ICD-10-CM | POA: Diagnosis not present

## 2015-12-12 NOTE — Assessment & Plan Note (Addendum)
-  PFT's 01/02/2013 wnl including fef 25-75 p am dulera 100 x 2 pffs - Med calendar 07/09/2013 > not using 12/30/2013 > not using action plans approp 01/21/2014 , 02/05/2014 , 06/22/2014   - 12/12/2015  p extensive coaching HFA effectiveness =    90%    All goals of chronic asthma control met including optimal function and elimination of symptoms with minimal need for rescue therapy.  I had an extended discussion with the patient reviewing all relevant studies completed to date and  lasting 15 to 20 minutes of a 25 minute visit on the following ongoing concerns:   Refuses to use action plan we gave him in a med calendar format so Contingencies discussed in full including contacting this office immediately if not controlling the symptoms using the rule of two's. Used teach back method to confirm he know how and when to use saba   Each maintenance medication was reviewed in detail including most importantly the difference between maintenance and as needed and under what circumstances the prns are to be used.  Please see instructions for details which were reviewed in writing and the patient given a copy.

## 2015-12-12 NOTE — Patient Instructions (Signed)
Plan A = Automatic = symbicort 160 Take 2 puffs first thing in am and then another 2 puffs about 12 hours later and singulair each am   Plan B = Backup Only use your albuterol as a rescue medication to be used if you can't catch your breath by resting or doing a relaxed purse lip breathing pattern.  - The less you use it, the better it will work when you need it. - Ok to use the inhaler up to 2 puffs  every 4 hours if you must but call for appointment if use goes up over your usual need - Don't leave home without it !!  (think of it like the spare tire for your car)     Please schedule a follow up visit in 3 months but call sooner if needed

## 2015-12-12 NOTE — Progress Notes (Signed)
Subjective:     Patient ID: Mark Ward, male   DOB: [birth year]   MRN: [id number]  Brief patient profile:  75 yobm never smoker never significant allergies or asthma with new onset cough Feb 2014 variably improved then relapsed seen by ent x 3 by UC x3 2, x ER x 3 and finally admitted:  Admit date: 11/24/2012  Discharge date: 11/26/2012  Discharge Diagnoses:   Moderate persistent asthma with exacerbation     07/09/2013 Folllow up and Med review  Returns for follow up and med review .  Reports breathing is doing well since last ov.  feels symptoms are more related to molds at work No SABA use.  Insurance will cover [employer].  Reviewed all his meds and organized them into a med calendar closely w/ pt education . Is following with ENT > sinus surgery 08/04/13 Mark Ward     03/18/2015  f/u ov/Mark Ward re: asthma/ no med calendar  Chief Complaint  Patient presents with  . Acute Visit    Pt c/o nasal congestion and cough for the past 10 days. Cough is prod with minimal yellow sputum and also has some yellow nasal d/c.     Some coughing just at work on good days, much worse x 10 days assoc with head cold no better p zpak called in  Rarely using saba even with this flare  rec Use afrin x 5 days sev minutes  before flonase and only before the flonase - ok to resume if 5 days off afrin  Work on inhaler technique:  Augmentin 875 mg take one pill twice daily  X 10 days - take at breakfast and supper with large glass of water.  Prednisone 10 mg take  4 each am x 2 days,   2 each am x 2 days,  1 each am x 2 days and stop    09/09/2015 acute extended ov/Mark Ward re: aeAsthma on symb160/flonase Mark Ward Chief Complaint  Patient presents with  . Acute Visit    Pt c/o sore throat x 10 days. He also c/o cough with green sputum and nasal congestion.    not using saba at all at baseline and doing great on above maint rx then acute onset sore throat/ cough/ purulent nasal secretions and harsh cough x 10 days>  still not using saba and does not remember when/why to use it  rec Work on inhaler technique:   Augmentin 875 mg take one pill twice daily  X 10 days.  Prednisone 10 mg take  4 each am x 2 days,   2 each am x 2 days,  1 each am x 2 days and stop     12/12/2015  [name]  Chief Complaint  Patient presents with  . Follow-up    pt states he is doing well, notes minor sinus congestion worse at work.    Very rare saba use / main issues are nasal and f/u Mark Ward   No obvious day to day or daytime variability or assoc   cp or chest tightness, subjective wheeze or overt sinus or hb symptoms. No unusual exp hx or h/o childhood pna/ asthma or knowledge of premature birth.  Sleeping ok without nocturnal  or early am exacerbation  of respiratory  c/o's or need for noct saba. Also denies any obvious fluctuation of symptoms with weather or environmental changes or other aggravating or alleviating factors except as outlined above   Current Medications, Allergies, Complete  Past Medical History, Past Surgical History, Family History, and Social History were reviewed in [employer] record.   ROS  The following are not active complaints unless bolded sore throat, dysphagia, dental problems, itching, sneezing,  nasal congestion or excess/ purulent   ear ache,   fever, chills, sweats, unintended wt loss, classically pleuritic or exertional cp, hemoptysis,  orthopnea pnd or leg swelling, presyncope, palpitations, abdominal pain, anorexia, nausea, vomiting, diarrhea  or change in bowel or bladder habits, change in stools or urine, dysuria,hematuria,  rash, arthralgias, visual complaints, headache, numbness, weakness or ataxia or problems with walking or coordination,  change in mood/affect or memory.        Objective:   Physical Exam  Pleasant amb bm nad    01/02/2013  212  > 02/03/2013  215 > 05/12/2013 211 > 06/24/2013  212>210  07/09/13 > 12/30/2013  213 > 01/21/2014  208 >208 02/05/2014 > 03/26/2014 211 > 05/25/2014 216>211 06/22/2014 > 212 03/18/2015 >  09/09/2015   215 >   212 12/12/2015    HEENT: nl dentition, and orophanx.  Min nonspecifics bilateral turbinate edema  NECK :  without JVD/Nodes/TM/ nl carotid upstrokes bilaterally   LUNGS: no acc muscle use,clear to A dn P    CV:  RRR  no s3 or murmur or increase in P2, no edema   ABD:  soft and nontender with nl excursion in the supine position. No bruits or organomegaly, bowel sounds nl  MS:  warm without deformities, calf tenderness, cyanosis or clubbing  SKIN: warm and dry without lesions               Assessment:

## 2015-12-12 NOTE — Assessment & Plan Note (Signed)
-   Augmentin x 20 days to be complete 01/12/13  -  Sinus CT 05/05/13 > Complete opacification of visualized portions of the frontal sinuses and ethmoid sinus air cells. No obvious intraorbital or intracranial extension. Moderate mucosal thickening/partial opacification sphenoid sinus air cells (left sphenoid sinus air cell is dominant). Polypoid moderate mucosal thickening maxillary sinuses. - rec ENT eval 05/06/2013 > surgery [name]) August 04 2013 > improved - Singulair trial 03/26/2014 >>>  - Allergy profile 03/26/2014 > igE 77 with neg RAST   I emphasized that nasal steroids have no immediate benefit in terms of improving symptoms.  To help them reached the target tissue, the patient should use Afrin two puffs every 12 hours applied one min before using the nasal steroids.  Afrin should be stopped after no more than 5 days.  If the symptoms worsen, Afrin can be restarted after 5 days off of therapy to prevent rebound congestion from overuse of Afrin.  I also emphasized that in no way are nasal steroids a concern in terms of "addiction".   If not responding to topical rx should f/u with Dr [name] again

## 2016-02-14 ENCOUNTER — Other Ambulatory Visit: Payer: Self-pay | Admitting: Internal Medicine

## 2016-02-14 MED ORDER — BUDESONIDE-FORMOTEROL FUMARATE 160-4.5 MCG/ACT IN AERO: INHALATION_SPRAY | RESPIRATORY_TRACT | 11 refills | Status: DC

## 2016-02-14 NOTE — Telephone Encounter (Signed)
Pt called and he stated that he needed a new rx for the symbicort and a new discount card.  The card has been left up front and he will come by and pick this up.  Pt is aware that the rx has been sent to his pharmacy.

## 2016-02-14 NOTE — Telephone Encounter (Signed)
Attempted to contact patient, left message for patient to return call.

## 2016-03-13 ENCOUNTER — Ambulatory Visit: Payer: 59 | Admitting: Internal Medicine

## 2016-03-15 ENCOUNTER — Encounter: Payer: Self-pay | Admitting: Internal Medicine

## 2016-03-15 ENCOUNTER — Ambulatory Visit (INDEPENDENT_AMBULATORY_CARE_PROVIDER_SITE_OTHER): Payer: 59 | Admitting: Internal Medicine

## 2016-03-15 VITALS — BP 130/86 | HR 106 | Ht 67.0 in | Wt 212.2 lb

## 2016-03-15 DIAGNOSIS — J45909 Unspecified asthma, uncomplicated: Secondary | ICD-10-CM | POA: Diagnosis not present

## 2016-03-15 DIAGNOSIS — Z23 Encounter for immunization: Secondary | ICD-10-CM

## 2016-03-15 DIAGNOSIS — J302 Other seasonal allergic rhinitis: Secondary | ICD-10-CM | POA: Diagnosis not present

## 2016-03-15 LAB — NITRIC OXIDE: Nitric Oxide: 42

## 2016-03-15 NOTE — Patient Instructions (Signed)
See calendar for specific medication instructions and bring it back for each and every office visit for every healthcare provider you see.  Without it,  you may not receive the best quality medical care that we feel you deserve. ° °You will note that the calendar groups together  your maintenance  medications that are timed at particular times of the day.  Think of this as your checklist for what your doctor has instructed you to do until your next evaluation to see what benefit  there is  to staying on a consistent group of medications intended to keep you well.  The other group at the bottom is entirely up to you to use as you see fit  for specific symptoms that may arise between visits that require you to treat them on an as needed basis.  Think of this as your action plan or "what if" list.  ° °Separating the top medications from the bottom group is fundamental to providing you adequate care going forward.   ° ° °Please schedule a follow up visit in 6 months but call sooner if needed  ° ° ° ° ° ° ° °

## 2016-03-15 NOTE — Progress Notes (Signed)
Subjective:     Patient ID: Mark Ward, male   DOB: 06-12-1965   MRN: [id number]  Brief patient profile:  89 yobm never smoker never significant allergies or asthma with new onset cough Feb 2014 variably improved then relapsed seen by ent x 3 by UC x3 2, x ER x 3 and finally admitted:  Admit date: 11/24/2012  Discharge date: 11/26/2012  Discharge Diagnoses:   Moderate persistent asthma with exacerbation     07/09/2013 NP  Folllow up and Med review  Returns for follow up and med review .  Reports breathing is doing well since last ov.  feels symptoms are more related to molds at work No SABA use.  Insurance will cover [employer].  Reviewed all his meds and organized them into a med calendar closely w/ pt education . Is following with ENT > sinus surgery 08/04/13 Mark Ward  rec Follow med calendar closely and bring to each visit. Continue on current regimen. Make sure to brush rinse and gargle after inhaler use. Continue followup with ENT    12/12/2015  [name]  Chief Complaint  Patient presents with  . Follow-up    pt states he is doing well, notes minor sinus congestion worse at work.    Very rare saba use / main issues are nasal and f/u Mark Ward  rec Plan A = Automatic = symbicort 160 Take 2 puffs first thing in am and then another 2 puffs about 12 hours later and singulair each am  Plan B = Backup Only use your albuterol as a rescue medication  Please schedule a follow up visit in 3 months but call sooner if needed  Use med calendar    03/15/2016  [name] Chief Complaint  Patient presents with  . Follow-up    Breathing is overall doing well. No new co's today.    main issues continue to be the nasal stuffiness  Does not understand action plan re saba but presently not needing at all    Not limited by breathing from desired activities  But not very active    No obvious day  to day or daytime variability or assoc excess/ purulent sputum or mucus plugs     cp or chest tightness, subjective wheeze or overt sinus or hb symptoms. No unusual exp hx or h/o childhood pna/ asthma or knowledge of premature birth.  Sleeping ok without nocturnal  or early am exacerbation  of respiratory  c/o's or need for noct saba. Also denies any obvious fluctuation of symptoms with weather or environmental changes or other aggravating or alleviating factors except as outlined above   Current Medications, Allergies, Complete Past Medical History, Past Surgical History, Family History, and Social History were reviewed in [employer] record.   ROS  The following are not active complaints unless bolded sore throat, dysphagia, dental problems, itching, sneezing,  nasal congestion or excess/ purulent   ear ache,   fever, chills, sweats, unintended wt loss, classically pleuritic or exertional cp, hemoptysis,  orthopnea pnd or leg swelling, presyncope, palpitations, abdominal pain, anorexia, nausea, vomiting, diarrhea  or change in bowel or bladder habits, change in stools or urine, dysuria,hematuria,  rash, arthralgias, visual complaints, headache, numbness, weakness or ataxia or problems with walking or coordination,  change in mood/affect or memory.        Objective:   Physical Exam  Pleasant amb bm  nad    Vital signs reviewed.  Note resting tachycardia/ - Note on arrival 02 sats 97% on RA    01/02/2013  212  > 02/03/2013  215 > 05/12/2013 211 > 06/24/2013  212>210 07/09/13 > 12/30/2013  213 > 01/21/2014  208 >208 02/05/2014 > 03/26/2014 211 > 05/25/2014 216>211 06/22/2014 > 212 03/18/2015 >  09/09/2015   215 >   212 12/12/2015 > 03/15/2016  212    HEENT: nl dentition, and orophanx.  Min nonspecifics bilateral turbinate edema  NECK :  without JVD/Nodes/TM/ nl carotid upstrokes bilaterally   LUNGS: no acc muscle use,clear to A dn P    CV:  RRR  no s3 or murmur or increase in P2,  no edema   ABD:  soft and nontender with nl excursion in the supine position. No bruits or organomegaly, bowel sounds nl  MS:  warm without deformities, calf tenderness, cyanosis or clubbing  SKIN: warm and dry without lesions      Assessment:

## 2016-03-19 NOTE — Assessment & Plan Note (Signed)
Reviewed again same theme with him on how to use afrin/ nasal steroids s risk of rebound.

## 2016-03-19 NOTE — Assessment & Plan Note (Signed)
-  PFT's 01/02/2013 wnl including fef 25-75 p am dulera 100 x 2 pffs - Med calendar 07/09/2013 > not using 12/30/2013 > not using action plans approp 01/21/2014 , 02/05/2014 , 06/22/2014  - 09/09/2015 p extensive coaching HFA effectiveness =    90%   All goals of chronic asthma control met including optimal function and elimination of symptoms with minimal need for rescue therapy.   I had an extended discussion with the patient reviewing all relevant studies completed to date and  lasting 15 to 20 minutes of a 25 minute visit on the following ongoing concerns:   Contingencies discussed in full including contacting this office immediately if not controlling the symptoms using the rule of two's.     The problem is that he doesn't understand how to use the action plan at the bottom of his med caldendar although written in a most user friendly fashiion :  Reading from Left to Right for any symptom he is likely to encounter whether upper or lower airway in nature, he has a max dose for all prns he should try before calling me or heading to the ER.

## 2016-03-22 ENCOUNTER — Other Ambulatory Visit: Payer: Self-pay | Admitting: Internal Medicine

## 2016-03-29 ENCOUNTER — Telehealth: Payer: Self-pay | Admitting: Internal Medicine

## 2016-03-29 NOTE — Telephone Encounter (Signed)
LMTCB

## 2016-03-30 NOTE — Telephone Encounter (Signed)
Letter typed and given to patient.  Nothing further needed.

## 2016-03-30 NOTE — Telephone Encounter (Signed)
Patient waiting in lobby, came in to pick up work excuse.Mark Ward

## 2016-05-22 ENCOUNTER — Telehealth: Payer: Self-pay | Admitting: Internal Medicine

## 2016-05-22 NOTE — Telephone Encounter (Signed)
ok 

## 2016-05-22 NOTE — Telephone Encounter (Signed)
Mom called to make an appt for pt for not feeling well.   However, pt not seen in over 3 yrs.  Mom and dad see you and would like to know if you will accept him back and see him asap. please advise, thank you!!

## 2016-05-23 ENCOUNTER — Telehealth: Payer: Self-pay | Admitting: Internal Medicine

## 2016-05-23 NOTE — Telephone Encounter (Signed)
Pt requesting symbicort copay card.  This has been left up front for pickup.  Nothing further needed.

## 2016-05-24 NOTE — Telephone Encounter (Signed)
Pt has been scheduled.  °

## 2016-05-25 ENCOUNTER — Encounter: Payer: Self-pay | Admitting: Internal Medicine

## 2016-05-25 ENCOUNTER — Ambulatory Visit: Payer: 59 | Admitting: Internal Medicine

## 2016-05-25 ENCOUNTER — Other Ambulatory Visit (INDEPENDENT_AMBULATORY_CARE_PROVIDER_SITE_OTHER): Payer: PRIVATE HEALTH INSURANCE

## 2016-05-25 ENCOUNTER — Telehealth: Payer: Self-pay

## 2016-05-25 ENCOUNTER — Emergency Department (HOSPITAL_COMMUNITY)
Admission: EM | Admit: 2016-05-25 | Discharge: 2016-05-25 | Disposition: A | Discharge status: Home / Self Care | Payer: No Typology Code available for payment source | Attending: Emergency Medicine | Admitting: Emergency Medicine

## 2016-05-25 ENCOUNTER — Ambulatory Visit (INDEPENDENT_AMBULATORY_CARE_PROVIDER_SITE_OTHER): Payer: PRIVATE HEALTH INSURANCE | Admitting: Internal Medicine

## 2016-05-25 ENCOUNTER — Encounter (HOSPITAL_COMMUNITY): Payer: Self-pay | Admitting: Emergency Medicine

## 2016-05-25 VITALS — BP 140/80 | HR 107 | Ht 67.0 in | Wt 191.0 lb

## 2016-05-25 DIAGNOSIS — J310 Chronic rhinitis: Secondary | ICD-10-CM

## 2016-05-25 DIAGNOSIS — E1165 Type 2 diabetes mellitus with hyperglycemia: Secondary | ICD-10-CM | POA: Insufficient documentation

## 2016-05-25 DIAGNOSIS — J45909 Unspecified asthma, uncomplicated: Secondary | ICD-10-CM | POA: Diagnosis not present

## 2016-05-25 DIAGNOSIS — R300 Dysuria: Secondary | ICD-10-CM | POA: Diagnosis not present

## 2016-05-25 DIAGNOSIS — E119 Type 2 diabetes mellitus without complications: Secondary | ICD-10-CM

## 2016-05-25 DIAGNOSIS — Z79899 Other long term (current) drug therapy: Secondary | ICD-10-CM | POA: Diagnosis not present

## 2016-05-25 DIAGNOSIS — E0800 Diabetes mellitus due to underlying condition with hyperosmolarity without nonketotic hyperglycemic-hyperosmolar coma (NKHHC): Secondary | ICD-10-CM

## 2016-05-25 LAB — BLOOD GAS, VENOUS
Acid-Base Excess: 5.8 mmol/L — ABNORMAL HIGH (ref 0.0–2.0)
BICARBONATE: 30.8 mmol/L — AB (ref 20.0–28.0)
O2 Saturation: 40.3 %
PATIENT TEMPERATURE: 37
PH VEN: 7.431 — AB (ref 7.250–7.430)
pCO2, Ven: 47.1 mmHg (ref 44.0–60.0)

## 2016-05-25 LAB — COMPREHENSIVE METABOLIC PANEL
ALT: 44 U/L (ref 17–63)
AST: 30 U/L (ref 15–41)
Albumin: 5 g/dL (ref 3.5–5.0)
Alkaline Phosphatase: 94 U/L (ref 38–126)
Anion gap: 8 (ref 5–15)
BILIRUBIN TOTAL: 1.1 mg/dL (ref 0.3–1.2)
BUN: 15 mg/dL (ref 6–20)
CHLORIDE: 97 mmol/L — AB (ref 101–111)
CO2: 32 mmol/L (ref 22–32)
Calcium: 9.9 mg/dL (ref 8.9–10.3)
Creatinine, Ser: 0.96 mg/dL (ref 0.61–1.24)
GFR calc Af Amer: >60 mL/min (ref >60)
Glucose, Bld: 361 mg/dL — ABNORMAL HIGH (ref 65–99)
POTASSIUM: 4.2 mmol/L (ref 3.5–5.1)
Sodium: 137 mmol/L (ref 135–145)
TOTAL PROTEIN: 9 g/dL — AB (ref 6.5–8.1)

## 2016-05-25 LAB — CBC WITH DIFFERENTIAL/PLATELET
BASOS ABS: 0 10*3/uL (ref 0.0–0.1)
BASOS PCT: 0.3 % (ref 0.0–3.0)
Basophils Absolute: 0 10*3/uL (ref 0.0–0.1)
Basophils Relative: 0 %
EOS PCT: 1 %
Eosinophils Absolute: 0 10*3/uL (ref 0.0–0.7)
Eosinophils Absolute: 0.1 10*3/uL (ref 0.0–0.7)
Eosinophils Relative: 0.7 % (ref 0.0–5.0)
HEMATOCRIT: 43.1 % (ref 39.0–52.0)
HEMATOCRIT: 44 % (ref 39.0–52.0)
HEMOGLOBIN: 15.1 g/dL (ref 13.0–17.0)
Hemoglobin: 15.7 g/dL (ref 13.0–17.0)
LYMPHS PCT: 32.8 % (ref 12.0–46.0)
LYMPHS PCT: 37 %
Lymphs Abs: 1.9 10*3/uL (ref 0.7–4.0)
Lymphs Abs: 2.9 10*3/uL (ref 0.7–4.0)
MCH: 29.5 pg (ref 26.0–34.0)
MCHC: 35 g/dL (ref 30.0–36.0)
MCHC: 35.7 g/dL (ref 30.0–36.0)
MCV: 84.2 fl (ref 78.0–100.0)
MCV: 82.7 fL (ref 78.0–100.0)
MONO ABS: 0.9 10*3/uL (ref 0.1–1.0)
MONOS PCT: 11 %
Monocytes Absolute: 0.5 10*3/uL (ref 0.1–1.0)
Monocytes Relative: 8.3 % (ref 3.0–12.0)
NEUTROS ABS: 3.9 10*3/uL (ref 1.7–7.7)
Neutro Abs: 3.4 10*3/uL (ref 1.4–7.7)
Neutrophils Relative %: 57.9 % (ref 43.0–77.0)
Neutrophils Relative %: 51 %
PLATELETS: 213 10*3/uL (ref 150–400)
Platelets: 207 10*3/uL (ref 150.0–400.0)
RBC: 5.12 Mil/uL (ref 4.22–5.81)
RBC: 5.32 MIL/uL (ref 4.22–5.81)
RDW: 12.5 % (ref 11.5–15.5)
RDW: 12.5 % (ref 11.5–15.5)
WBC: 5.9 10*3/uL (ref 4.0–10.5)
WBC: 7.7 10*3/uL (ref 4.0–10.5)

## 2016-05-25 LAB — BASIC METABOLIC PANEL
BUN: 16 mg/dL (ref 6–23)
CHLORIDE: 97 meq/L (ref 96–112)
CO2: 31 mEq/L (ref 19–32)
Calcium: 10.1 mg/dL (ref 8.4–10.5)
Creatinine, Ser: 1.03 mg/dL (ref 0.40–1.50)
GFR: 98.84 mL/min (ref >60.00)
Glucose, Bld: 561 mg/dL (ref 70–99)
POTASSIUM: 4.4 meq/L (ref 3.5–5.1)
SODIUM: 136 meq/L (ref 135–145)

## 2016-05-25 LAB — URINALYSIS, ROUTINE W REFLEX MICROSCOPIC
BILIRUBIN URINE: NEGATIVE
Bacteria, UA: NONE SEEN
Glucose, UA: >=500 mg/dL — AB
HGB URINE DIPSTICK: NEGATIVE
KETONES UR: NEGATIVE mg/dL
LEUKOCYTES UA: NEGATIVE
Nitrite: NEGATIVE
PH: 6 (ref 5.0–8.0)
Protein, ur: NEGATIVE mg/dL
RBC / HPF: NONE SEEN RBC/hpf (ref 0–5)
SPECIFIC GRAVITY, URINE: 1.025 (ref 1.005–1.030)
Squamous Epithelial / LPF: NONE SEEN

## 2016-05-25 LAB — CBG MONITORING, ED
GLUCOSE-CAPILLARY: 413 mg/dL — AB (ref 65–99)
GLUCOSE-CAPILLARY: 305 mg/dL — AB (ref 65–99)

## 2016-05-25 LAB — URINALYSIS
BILIRUBIN URINE: NEGATIVE
HGB URINE DIPSTICK: NEGATIVE
KETONES UR: NEGATIVE
LEUKOCYTES UA: NEGATIVE
NITRITE: NEGATIVE
Specific Gravity, Urine: <=1.005 — AB (ref 1.000–1.030)
Total Protein, Urine: NEGATIVE
UROBILINOGEN UA: 0.2 (ref 0.0–1.0)
Urine Glucose: >=1000 — AB
pH: 6 (ref 5.0–8.0)

## 2016-05-25 MED ORDER — SODIUM CHLORIDE 0.9 % IV BOLUS (SEPSIS)
1000.0000 mL | Freq: Once | INTRAVENOUS | Status: AC
  Administered 2016-05-25: 1000 mL via INTRAVENOUS

## 2016-05-25 MED ORDER — METFORMIN HCL 500 MG PO TABS
500.0000 mg | ORAL_TABLET | Freq: Two times a day (BID) | ORAL | 0 refills | Status: DC
Start: 2016-05-25 — End: 2016-06-14

## 2016-05-25 NOTE — Telephone Encounter (Signed)
The lab called wanting to report a criticial lab for pt's glucose, reported 561 critical high result.

## 2016-05-25 NOTE — Telephone Encounter (Signed)
[place]

## 2016-05-25 NOTE — Assessment & Plan Note (Signed)
New onset, explains his relative tachycardia, dry mouth and urinary frequency > advised to go to ER

## 2016-05-25 NOTE — Progress Notes (Signed)
Spoke with pt and notified of results per Dr. [name]. Pt verbalized understanding and denied any questions. 

## 2016-05-25 NOTE — Patient Instructions (Addendum)
Please remember to go to the lab  department downstairs for your tests - we will call you with the results when they are available.  See calendar for specific medication instructions and bring it back for each and every office visit for every healthcare provider you see.  Without it,  you may not receive the best quality medical care that we feel you deserve.  You will note that the calendar groups together  your maintenance  medications that are timed at particular times of the day.  Think of this as your checklist for what your doctor has instructed you to do until your next evaluation to see what benefit  there is  to staying on a consistent group of medications intended to keep you well.  The other group at the bottom is entirely up to you to use as you see fit  for specific symptoms that may arise between visits that require you to treat them on an as needed basis.  Think of this as your action plan or "what if" list.   Separating the top medications from the bottom group is fundamental to providing you adequate care going forward.       If you are satisfied with your treatment plan,  let your doctor know and he/she can either refill your medications or you can return here when your prescription runs out.     If in any way you are not 100% satisfied,  please tell us.  If 100% better, tell your friends!  Pulmonary follow up is as needed

## 2016-05-25 NOTE — Discharge Instructions (Signed)
Take the prescribed medication as directed. °Follow-up with your primary care doctor. °Return to the ED for new or worsening symptoms. °

## 2016-05-25 NOTE — Assessment & Plan Note (Signed)
-  PFT's 01/02/2013 wnl including fef 25-75 p am dulera 100 x 2 pffs - Med calendar 07/09/2013 > not using 12/30/2013 > not using action plans approp 01/21/2014 , 02/05/2014 , 06/22/2014  Or 05/25/2016  - 09/09/2015 p extensive coaching HFA effectiveness =    90%   Consistently inconsistent with following instructions or understanding action plans but despite this All goals of chronic asthma control met including optimal function and elimination of symptoms with minimal need for rescue therapy.  Contingencies discussed in full including contacting this office immediately if not controlling the symptoms using the rule of two's.      I had an extended discussion with the patient reviewing all relevant studies completed to date and  lasting 15 to 20 minutes of a 25 minute visit    Each maintenance medication was reviewed in detail including most importantly the difference between maintenance and prns and under what circumstances the prns are to be triggered using an action plan format that is not reflected in the computer generated alphabetically organized AVS but trather by a customized med calendar that reflects the AVS meds with confirmed 100% correlation.   In addition, Please see AVS for unique instructions that I personally wrote and verbalized to the the pt in detail and then reviewed with pt  by my nurse highlighting any  changes in therapy recommended at today's visit to their plan of care.     

## 2016-05-25 NOTE — Progress Notes (Signed)
LMTCB

## 2016-05-25 NOTE — Assessment & Plan Note (Addendum)
Variable symptoms where dribbles with freq only at work but clearly has polyuria, not dribbling, from new dm

## 2016-05-25 NOTE — Progress Notes (Signed)
Subjective:     Patient ID: Mark Ward, male   DOB: 1965-06-03   MRN: [id number]  Brief patient profile:  25 yobm never smoker never significant allergies or asthma with new onset cough Feb 2014 variably improved then relapsed seen by ent x 3 by UC x3 2, x ER x 3 and finally admitted:  Admit date: 11/24/2012  Discharge date: 11/26/2012  Discharge Diagnoses:   Moderate persistent asthma with exacerbation     07/09/2013 NP  Folllow up and Med review  Returns for follow up and med review .  Reports breathing is doing well since last ov.  feels symptoms are more related to molds at work No SABA use.  Insurance will cover [employer].  Reviewed all his meds and organized them into a med calendar closely w/ pt education . Is following with ENT > sinus surgery 08/04/13 Mark Ward  rec Follow med calendar closely and bring to each visit. Continue on current regimen. Make sure to brush rinse and gargle after inhaler use. Continue followup with ENT    12/12/2015  [name]  Chief Complaint  Patient presents with  . Follow-up    pt states he is doing well, notes minor sinus congestion worse at work.    Very rare saba use / main issues are nasal and f/u Mark Ward  rec Plan A = Automatic = symbicort 160 Take 2 puffs first thing in am and then another 2 puffs about 12 hours later and singulair each am  Plan B = Backup Only use your albuterol as a rescue medication  Please schedule a follow up visit in 3 months but call sooner if needed  Use med calendar    03/15/2016  [name] Chief Complaint  Patient presents with  . Follow-up    Breathing is overall doing well. No new co's today.    main issues continue to be the nasal stuffiness  Does not understand action plan re saba but presently not needing at all    Not limited by breathing from desired activities  But not very active  rec No change in  meds/ bring med calendar to each ov    05/25/2016  [name]  Chief Complaint  Patient presents with  . Acute Visit    Pt c/o increased urination for the past several days.   has not seen PC in 4 years / no eyedrops  Says urine dribbles at work but not at home where has good flow  X 5 days s dysuria or fever but  assoc mouth dryness Plans to see Mark Ward for  Epistaxis   Rarely need proair now  Convince the air ducts at work are causing his nose to be too dry and bleed but no proair required at work   No obvious day to day or daytime variability or assoc excess/ purulent sputum or mucus plugs or hemoptysis or cp or chest tightness, subjective wheeze or overt hb symptoms. No unusual exp hx or h/o childhood pna/ asthma or knowledge of premature birth.  Sleeping ok without nocturnal  or early am exacerbation  of respiratory  c/o's or need for noct saba. Also denies any obvious fluctuation of symptoms with weather or environmental changes or other aggravating or alleviating factors except as outlined above   Current Medications, Allergies, Complete Past Medical History, Past Surgical History,  Family History, and Social History were reviewed in [employer] record.  ROS  The following are not active complaints unless bolded sore throat, dysphagia, dental problems, itching, sneezing,  nasal congestion or excess/ purulent secretions, ear ache,   fever, chills, sweats, unintended wt loss, classically pleuritic or exertional cp,  orthopnea pnd or leg swelling, presyncope, palpitations, abdominal pain, anorexia, nausea, vomiting, diarrhea  or change in bowel or bladder habits, change in stools or urine, dysuria,hematuria,  rash, arthralgias, visual complaints, headache, numbness, weakness or ataxia or problems with walking or coordination,  change in mood/affect or memory.              Objective:   Physical Exam    amb bm nad   Very unusual affect who failed to answer a single question asked in a straightforward manner, tending to go off on tangents or answer questions with ambiguous medical terms or diagnoses and relating all problems back to work   Vital signs reviewed - Note on arrival 02 sats  97% on RA- note pulse 107 at rest    01/02/2013  212  > 02/03/2013  215 > 05/12/2013 211 > 06/24/2013  212>210 07/09/13 > 12/30/2013  213 > 01/21/2014  208 >208 02/05/2014 > 03/26/2014 211 > 05/25/2014 216>211 06/22/2014 > 212 03/18/2015 >  09/09/2015   215 >   212 12/12/2015 > 03/15/2016  212 > 05/25/2016   191    HEENT: nl dentition, and orophanx which is relatively dry.  Mild/ mod nonspecifics bilateral turbinate edema  NECK :  without JVD/Nodes/TM/ nl carotid upstrokes bilaterally   LUNGS: no acc muscle use,clear to A and P    CV:  RRR  no s3 or murmur or increase in P2, no edema   ABD:  soft and nontender with nl excursion in the supine position. No bruits or organomegaly, bowel sounds nl  MS:  warm without deformities, calf tenderness, cyanosis or clubbing  SKIN: warm and dry without lesions       Chemistry      Component Value Date/Time   NA 136 05/25/2016 1039   K 4.4 05/25/2016 1039   CL 97 05/25/2016 1039   CO2 31 05/25/2016 1039   BUN 16 05/25/2016 1039   CREATININE 1.03 05/25/2016 1039      Component Value Date/Time   CALCIUM 10.1 05/25/2016 1039   ALKPHOS 124 (H) 06/15/2013 0128   AST 44 (H) 06/15/2013 0128   ALT 43 06/15/2013 0128   BILITOT 0.5 06/15/2013 0128      Glucose    561                  05/25/2016       Assessment:

## 2016-05-25 NOTE — ED Provider Notes (Signed)
[facility] DEPT Provider Note   CSN: [id number] Arrival date & time: 05/25/16  1403     History   Chief Complaint Chief Complaint  Patient presents with  . Hyperglycemia    HPI Mark Ward is a 51 y.o. male.  The history is provided by the patient and medical records.  Hyperglycemia  Associated symptoms: increased thirst and polyuria     51 year old male with history of asthma, recurrent bronchitis, chronic sinusitis, obesity, presenting to the ED for elevated blood sugars. Patient reports he was seen by his pulmonologist today for re-check of sinus issues and had his blood sugar checked and was told it was high and to come to the ED for new onset diabetes.  Patient denies any personal history of diabetes.  Reports mom and dad both had diabetes.  He reports frequent thirst and urination recently. He denies any nausea or vomiting. No dizziness or weakness. No recent steroid use.  Past Medical History:  Diagnosis Date  . Bronchitis   . Chronic sinusitis     Patient Active Problem List   Diagnosis Date Noted  . Dysuria 05/25/2016  . Diabetes mellitus ([place]) 05/25/2016  . Obesity 03/18/2015  . Excessive ear wax 03/26/2014  . Asthma exacerbation 06/15/2013  . Chronic rhinitis 01/05/2013  . Intrinsic asthma 11/24/2012    Past Surgical History:  Procedure Laterality Date  . MOUTH SURGERY         Home Medications    Prior to Admission medications   Medication Sig Start Date End Date Taking? Authorizing Provider  albuterol (PROAIR HFA) 108 (90 BASE) MCG/ACT inhaler 2 puffs every 4 hours if needed 05/25/14   Tanda Rockers, MD  Aloe-Sodium Chloride (AYR SALINE NASAL GEL NA) As needed    Historical Provider, MD  B Complex-Biotin-FA (VITAMIN B50 COMPLEX PO) Take 1 tablet by mouth daily.    Historical Provider, MD  budesonide-formoterol (SYMBICORT) 160-4.5 MCG/ACT inhaler TAKE 2 PUFFS 1ST THING IN THE MORNING AND ANOTHER 2 PUFFS ABOUT 12 HOURS LATER 02/14/16   Tanda Rockers, MD  dextromethorphan-guaiFENesin [facility] DM) 30-600 MG 12hr tablet Take 1 tablet by mouth 2 (two) times daily as needed for cough.    Historical Provider, MD  fluticasone (FLONASE) 50 MCG/ACT nasal spray Place 1 spray into both nostrils 2 (two) times daily as needed for allergies or rhinitis.    Historical Provider, MD  guaifenesin (ROBITUSSIN) 100 MG/5ML syrup Take 200 mg by mouth 3 (three) times daily as needed for cough.    Historical Provider, MD  Ibuprofen (ADVIL) 200 MG CAPS Per bottle directions as needed    Historical Provider, MD  L-GLUTAMINE PO Take 1,000 mg by mouth daily.    Historical Provider, MD  montelukast (SINGULAIR) 10 MG tablet TAKE 1 TABLET (10 MG TOTAL) BY MOUTH AT BEDTIME. 03/23/16   Tanda Rockers, MD  Multiple Vitamins-Minerals (CENTRUM) tablet Take 1 tablet by mouth daily.    Historical Provider, MD  oxymetazoline (AFRIN) 0.05 % nasal spray 2 puffs twice daily x 5 days, off for 2 wks.    Historical Provider, MD  Probiotic Product (PROBIOTIC PO) Take 1 capsule by mouth daily.    Historical Provider, MD  pyridOXINE (VITAMIN B-6) 100 MG tablet Take 100 mg by mouth daily.    Historical Provider, MD  Saline GEL as needed.    Historical Provider, MD  sodium chloride (OCEAN) 0.65 % SOLN nasal spray Place 1 spray into both nostrils as needed for congestion.  Historical Provider, MD  vitamin C (ASCORBIC ACID) 500 MG tablet Take 500 mg by mouth daily.    Historical Provider, MD    Family History No family history on file.  Social History Social History  Substance Use Topics  . Smoking status: Never Smoker  . Smokeless tobacco: Never Used  . Alcohol use No     Allergies   Patient has no known allergies.   Review of Systems Review of Systems  Endocrine: Positive for polydipsia and polyuria.  All other systems reviewed and are negative.    Physical Exam Updated Vital Signs BP 154/94 (BP Location: Left Arm)   Pulse 99   Temp 98.3 F (36.8 C) (Oral)    Resp 19   Ht 5\' 7"  (1.702 m)   Wt 86.6 kg   SpO2 97%   BMI 29.91 kg/m   Physical Exam  Constitutional: He is oriented to person, place, and time. He appears well-developed and well-nourished.  HENT:  Head: Normocephalic and atraumatic.  Mouth/Throat: Oropharynx is clear and moist.  Dry mucous membranes  Eyes: Conjunctivae and EOM are normal. Pupils are equal, round, and reactive to light.  Neck: Normal range of motion.  Cardiovascular: Normal rate, regular rhythm and normal heart sounds.   Pulmonary/Chest: Effort normal and breath sounds normal.  Abdominal: Soft. Bowel sounds are normal. There is no tenderness.  Abdomen soft, benign, no focal tenderness or peritoneal signs  Musculoskeletal: Normal range of motion.  Neurological: He is alert and oriented to person, place, and time.  Skin: Skin is warm and dry.  Psychiatric: He has a normal mood and affect.  Nursing note and vitals reviewed.    ED Treatments / Results  Labs (all labs ordered are listed, but only abnormal results are displayed) Labs Reviewed  COMPREHENSIVE METABOLIC PANEL - Abnormal; Notable for the following:       Result Value   Chloride 97 (*)    Glucose, Bld 361 (*)    Total Protein 9.0 (*)    All other components within normal limits  BLOOD GAS, VENOUS - Abnormal; Notable for the following:    pH, Ven 7.431 (*)    Bicarbonate 30.8 (*)    Acid-Base Excess 5.8 (*)    All other components within normal limits  URINALYSIS, ROUTINE W REFLEX MICROSCOPIC - Abnormal; Notable for the following:    APPearance HAZY (*)    Glucose, UA >=500 (*)    All other components within normal limits  CBG MONITORING, ED - Abnormal; Notable for the following:    Glucose-Capillary 413 (*)    All other components within normal limits  CBC WITH DIFFERENTIAL/PLATELET    EKG  EKG Interpretation None       Radiology No results found.  Procedures Procedures (including critical care time)  Medications Ordered in  ED Medications  sodium chloride 0.9 % bolus 1,000 mL (0 mLs Intravenous Stopped 05/25/16 1850)     Initial Impression / Assessment and Plan / ED Course  I have reviewed the triage vital signs and the nursing notes.  Pertinent labs & imaging results that were available during my care of the patient were reviewed by me and considered in my medical decision making (see chart for details).  61 -year-old male here with new onset diabetes. Blood sugar here 413. He does have chronic respiratory issues, but no recent prednisone use. He is afebrile and nontoxic. Lab work consistent with diabetes, however no findings suggestive of DKA. He was given IV fluids  here with improvement of glucose. He is tolerating oral fluids well. Will start on metformin. Recommended carb modified diet, monitor sugar intake-- given information about this. Follow-up with PCP closely.  Discussed plan with patient, he acknowledged understanding and agreed with plan of care.  Return precautions given for new or worsening symptoms.  Final Clinical Impressions(s) / ED Diagnoses   Final diagnoses:  Type 2 diabetes mellitus without complication, without long-term current use of insulin (HCC)    New Prescriptions New Prescriptions   METFORMIN (GLUCOPHAGE) 500 MG TABLET    Take 1 tablet (500 mg total) by mouth 2 (two) times daily with a meal.     Larene Pickett, PA-C 05/25/16 1908    Tanna Furry, MD 05/31/16 1534

## 2016-05-25 NOTE — ED Triage Notes (Addendum)
Patient went to Dr Gustavus Hinchey office today for sinus problems. While there patient had elevated CBG reading but patient doesn't know what the reading was.  Patient states that he had blood work and UA done and was told he has new onset diabetes and needed to come to ED.   Patient reports since this weekend having increase in thirst, dry mouth, increase in urination.

## 2016-05-25 NOTE — Assessment & Plan Note (Signed)
-   Augmentin x 20 days to be complete 01/12/13  -  Sinus CT 05/05/13 > Complete opacification of visualized portions of the frontal sinuses and ethmoid sinus air cells. No obvious intraorbital or intracranial extension. Moderate mucosal thickening/partial opacification sphenoid sinus air cells (left sphenoid sinus air cell is dominant). Polypoid moderate mucosal thickening maxillary sinuses. - rec ENT eval 05/06/2013 > surgery [name]) August 04 2013 > improved - Singulair trial 03/26/2014 >>>  - Allergy profile 03/26/2014 > igE 77 with neg RAST - referred back to [facility] for epistaxis 05/25/2016 >>>   Pulmonary f/u for this problem is prn

## 2016-05-31 ENCOUNTER — Encounter: Payer: Self-pay | Admitting: Internal Medicine

## 2016-05-31 ENCOUNTER — Ambulatory Visit (INDEPENDENT_AMBULATORY_CARE_PROVIDER_SITE_OTHER): Payer: PRIVATE HEALTH INSURANCE | Admitting: Internal Medicine

## 2016-05-31 DIAGNOSIS — E1165 Type 2 diabetes mellitus with hyperglycemia: Secondary | ICD-10-CM

## 2016-05-31 DIAGNOSIS — E1169 Type 2 diabetes mellitus with other specified complication: Secondary | ICD-10-CM | POA: Insufficient documentation

## 2016-05-31 MED ORDER — ONETOUCH ULTRASOFT LANCETS MISC: 12 refills | Status: AC

## 2016-05-31 MED ORDER — INSULIN GLARGINE 300 UNIT/ML ~~LOC~~ SOPN: 16.0000 [IU] | PEN_INJECTOR | Freq: Every day | SUBCUTANEOUS | Status: DC

## 2016-05-31 MED ORDER — GLUCOSE BLOOD VI STRP: ORAL_STRIP | 12 refills | Status: AC

## 2016-05-31 MED ORDER — INSULIN PEN NEEDLE 31G X 5 MM MISC: 3 refills | Status: AC

## 2016-05-31 NOTE — Progress Notes (Signed)
Subjective:    Patient ID: Mark Ward, male    DOB: 01/24/1966, 51 y.o.   MRN: [id number]  HPI  51 year old patient who has a history of asthma.  He was seen in consultation by pulmonary last week and referred to the ED with new onset uncontrolled diabetes.  He was placed on metformin therapy.  He feels improved today.  He has had some polyuria for about a month and increased urination for about 1 week.  Perhaps has had some mild blurred vision.  He has had a weight loss from 212 down to 196 His asthma has been stable  Past Medical History:  Diagnosis Date  . Bronchitis   . Chronic sinusitis      Social History   Social History  . Marital status: Single    Spouse name: N/A  . Number of children: N/A  . Years of education: N/A   Occupational History  . [employer]   Social History Main Topics  . Smoking status: Never Smoker  . Smokeless tobacco: Never Used  . Alcohol use No  . Drug use: No  . Sexual activity: Not on file   Other Topics Concern  . Not on file   Social History Narrative  . No narrative on file    Past Surgical History:  Procedure Laterality Date  . MOUTH SURGERY      No family history on file.  No Known Allergies  Current Outpatient Prescriptions on File Prior to Visit  Medication Sig Dispense Refill  . albuterol (PROAIR HFA) 108 (90 BASE) MCG/ACT inhaler 2 puffs every 4 hours if needed 1 Inhaler 1  . Aloe-Sodium Chloride (AYR SALINE NASAL GEL NA) As needed    . B Complex-Biotin-FA (VITAMIN B50 COMPLEX PO) Take 1 tablet by mouth daily.    . budesonide-formoterol (SYMBICORT) 160-4.5 MCG/ACT inhaler TAKE 2 PUFFS 1ST THING IN THE MORNING AND ANOTHER 2 PUFFS ABOUT 12 HOURS LATER 10.2 Inhaler 11  . dextromethorphan-guaiFENesin (MUCINEX DM) 30-600 MG 12hr tablet Take 1 tablet by mouth 2 (two) times daily as needed for cough.    . fluticasone (FLONASE) 50 MCG/ACT nasal spray Place 1 spray into both nostrils 2 (two) times daily as needed  for allergies or rhinitis.    [name] guaifenesin (ROBITUSSIN) 100 MG/5ML syrup Take 200 mg by mouth 3 (three) times daily as needed for cough.    . Ibuprofen (ADVIL) 200 MG CAPS Per bottle directions as needed    . L-GLUTAMINE PO Take 1,000 mg by mouth daily.    . metFORMIN (GLUCOPHAGE) 500 MG tablet Take 1 tablet (500 mg total) by mouth 2 (two) times daily with a meal. 60 tablet 0  . montelukast (SINGULAIR) 10 MG tablet TAKE 1 TABLET (10 MG TOTAL) BY MOUTH AT BEDTIME. 30 tablet 11  . Multiple Vitamins-Minerals (CENTRUM) tablet Take 1 tablet by mouth daily.    [name] oxymetazoline (AFRIN) 0.05 % nasal spray 2 puffs twice daily x 5 days, off for 2 wks.    . Probiotic Product (PROBIOTIC PO) Take 1 capsule by mouth daily.    [name] pyridOXINE (VITAMIN B-6) 100 MG tablet Take 100 mg by mouth daily.    . Saline GEL as needed.    . sodium chloride (OCEAN) 0.65 % SOLN nasal spray Place 1 spray into both nostrils as needed for congestion.    . vitamin C (ASCORBIC ACID) 500 MG tablet Take 500 mg by mouth daily.     No current facility-administered medications on  file prior to visit.     BP (!) 138/100 (BP Location: Right Arm, Patient Position: Sitting, Cuff Size: Normal)   Pulse 100   Temp 98.6 F (37 C) (Oral)   Ht 5\' 7"  (1.702 m)   Wt 196 lb 9.6 oz (89.2 kg)   BMI 30.79 kg/m     Review of Systems  Constitutional: Positive for unexpected weight change. Negative for appetite change, chills, fatigue and fever.  HENT: Negative for congestion, dental problem, ear pain, hearing loss, sore throat, tinnitus, trouble swallowing and voice change.   Eyes: Negative for pain, discharge and visual disturbance.  Respiratory: Negative for cough, chest tightness, wheezing and stridor.   Cardiovascular: Negative for chest pain, palpitations and leg swelling.  Gastrointestinal: Negative for abdominal distention, abdominal pain, blood in stool, constipation, diarrhea, nausea and vomiting.  Endocrine: Positive for  polydipsia and polyuria.  Genitourinary: Negative for difficulty urinating, discharge, flank pain, genital sores, hematuria and urgency.  Musculoskeletal: Negative for arthralgias, back pain, gait problem, joint swelling, myalgias and neck stiffness.  Skin: Negative for rash.  Neurological: Negative for dizziness, syncope, speech difficulty, weakness, numbness and headaches.  Hematological: Negative for adenopathy. Does not bruise/bleed easily.  Psychiatric/Behavioral: Negative for behavioral problems and dysphoric mood. The patient is not nervous/anxious.        Objective:   Physical Exam  Constitutional: He is oriented to person, place, and time. He appears well-developed.  Blood pressure 150/90 Pulse 100 Weight 197  HENT:  Head: Normocephalic.  Right Ear: External ear normal.  Left Ear: External ear normal.  Eyes: Conjunctivae and EOM are normal.  Neck: Normal range of motion.  Cardiovascular: Normal rate and normal heart sounds.   Pulmonary/Chest: Breath sounds normal.  Abdominal: Bowel sounds are normal.  Musculoskeletal: Normal range of motion. He exhibits no edema or tenderness.  Neurological: He is alert and oriented to person, place, and time.  Psychiatric: He has a normal mood and affect. His behavior is normal.          Assessment & Plan:   New-onset diabetes.  Patient is now on metformin at a submaximal dose, which he is tolerating.  We'll start on basal insulin.  Will refer for diabetic teaching Home glucometer dispensed  Recheck 2 weeks  Asthma, stable Exogenous obesity.  Weight loss encouraged  [name]

## 2016-05-31 NOTE — Patient Instructions (Addendum)
Return in 2 weeks for follow-up  Check blood sugar before breakfast in the morning and before your evening meal  Insulin 16 units at bedtime.  Increase 4 units every 3 days if fasting blood sugar remains over 200  Diabetic teaching as discussed  You need to lose weight.  Consider a lower calorie diet and regular exercise.    It is important that you exercise regularly, at least 20 minutes 3 to 4 times per week.  If you develop chest pain or shortness of breath seek  medical attention.

## 2016-05-31 NOTE — Progress Notes (Signed)
Pre visit review using our clinic review tool, if applicable. No additional management support is needed unless otherwise documented below in the visit note. 

## 2016-06-07 DIAGNOSIS — R040 Epistaxis: Secondary | ICD-10-CM | POA: Insufficient documentation

## 2016-06-14 ENCOUNTER — Ambulatory Visit (INDEPENDENT_AMBULATORY_CARE_PROVIDER_SITE_OTHER): Payer: PRIVATE HEALTH INSURANCE | Admitting: Internal Medicine

## 2016-06-14 ENCOUNTER — Encounter: Payer: Self-pay | Admitting: Internal Medicine

## 2016-06-14 VITALS — BP 148/76 | HR 96 | Temp 98.5°F | Ht 67.0 in | Wt 193.6 lb

## 2016-06-14 DIAGNOSIS — E1165 Type 2 diabetes mellitus with hyperglycemia: Secondary | ICD-10-CM | POA: Diagnosis not present

## 2016-06-14 MED ORDER — METFORMIN HCL 1000 MG PO TABS: 1000.0000 mg | ORAL_TABLET | Freq: Two times a day (BID) | ORAL | 5 refills | Status: DC

## 2016-06-14 MED ORDER — DULAGLUTIDE 1.5 MG/0.5ML ~~LOC~~ SOAJ: 1.5000 mg | SUBCUTANEOUS | 6 refills | Status: DC

## 2016-06-14 NOTE — Patient Instructions (Signed)
Increase metformin to 1000 mg twice daily  Trulicity as directed  Decrease insulin to 10 units at bedtime;  Okay to taper and discontinue  Return in one month for follow-up  Consider dietary/nutritional consultation    It is important that you exercise regularly, at least 20 minutes 3 to 4 times per week.  If you develop chest pain or shortness of breath seek  medical attention.  You need to lose weight.  Consider a lower calorie diet and regular exercise.

## 2016-06-14 NOTE — Progress Notes (Signed)
Subjective:    Patient ID: Mark Ward, male    DOB: 1965/05/26, 51 y.o.   MRN: [id number]  HPI  51 year old patient who is seen for follow-up of his recently diagnosed diabetes He is on basal insulin and has uptitrated to 20 units.  Fasting blood sugars and prandial blood sugars are very well controlled with a low fasting blood sugar over the past week of 49 No hypoglycemic symptoms He is on metformin 500 mg twice daily and basal insulin only  He is eating better  Wt Readings from Last 3 Encounters:  06/14/16 193 lb 9.6 oz (87.8 kg)  05/31/16 196 lb 9.6 oz (89.2 kg)  05/25/16 191 lb (86.6 kg)    Past Medical History:  Diagnosis Date  . Bronchitis   . Chronic sinusitis      Social History   Social History  . Marital status: Single    Spouse name: N/A  . Number of children: N/A  . Years of education: N/A   Occupational History  . [employer]   Social History Main Topics  . Smoking status: Never Smoker  . Smokeless tobacco: Never Used  . Alcohol use No  . Drug use: No  . Sexual activity: Not on file   Other Topics Concern  . Not on file   Social History Narrative  . No narrative on file    Past Surgical History:  Procedure Laterality Date  . MOUTH SURGERY      No family history on file.  No Known Allergies  Current Outpatient Prescriptions on File Prior to Visit  Medication Sig Dispense Refill  . albuterol (PROAIR HFA) 108 (90 BASE) MCG/ACT inhaler 2 puffs every 4 hours if needed 1 Inhaler 1  . Aloe-Sodium Chloride (AYR SALINE NASAL GEL NA) As needed    . B Complex-Biotin-FA (VITAMIN B50 COMPLEX PO) Take 1 tablet by mouth daily.    . budesonide-formoterol (SYMBICORT) 160-4.5 MCG/ACT inhaler TAKE 2 PUFFS 1ST THING IN THE MORNING AND ANOTHER 2 PUFFS ABOUT 12 HOURS LATER 10.2 Inhaler 11  . fluticasone (FLONASE) 50 MCG/ACT nasal spray Place 1 spray into both nostrils 2 (two) times daily as needed for allergies or rhinitis.    [name] glucose blood  (ONETOUCH VERIO) test strip Use as instructed 100 each 12  . guaifenesin (ROBITUSSIN) 100 MG/5ML syrup Take 200 mg by mouth 3 (three) times daily as needed for cough.    . Ibuprofen (ADVIL) 200 MG CAPS Per bottle directions as needed    . Insulin Glargine (TOUJEO SOLOSTAR) 300 UNIT/ML SOPN Inject 16 Units into the skin at bedtime.    . Insulin Pen Needle 31G X 5 MM MISC Pt is to check blood sugar before breakfast and before evening meal 100 each 3  . L-GLUTAMINE PO Take 1,000 mg by mouth daily.    . Lancets (ONETOUCH ULTRASOFT) lancets Use as instructed 100 each 12  . montelukast (SINGULAIR) 10 MG tablet TAKE 1 TABLET (10 MG TOTAL) BY MOUTH AT BEDTIME. 30 tablet 11  . Multiple Vitamins-Minerals (CENTRUM) tablet Take 1 tablet by mouth daily.    [name] oxymetazoline (AFRIN) 0.05 % nasal spray 2 puffs twice daily x 5 days, off for 2 wks.    . Probiotic Product (PROBIOTIC PO) Take 1 capsule by mouth daily.    [name] pyridOXINE (VITAMIN B-6) 100 MG tablet Take 100 mg by mouth daily.    . Saline GEL as needed.    . sodium chloride (OCEAN) 0.65 % SOLN nasal  spray Place 1 spray into both nostrils as needed for congestion.    . vitamin C (ASCORBIC ACID) 500 MG tablet Take 500 mg by mouth daily.     No current facility-administered medications on file prior to visit.     BP (!) 148/76 (BP Location: Right Arm, Patient Position: Sitting, Cuff Size: Normal)   Pulse 96   Temp 98.5 F (36.9 C) (Oral)   Ht 5\' 7"  (1.702 m)   Wt 193 lb 9.6 oz (87.8 kg)   SpO2 98%   BMI 30.32 kg/m     Review of Systems  Constitutional: Negative for appetite change, chills, fatigue and fever.  HENT: Negative for congestion, dental problem, ear pain, hearing loss, sore throat, tinnitus, trouble swallowing and voice change.   Eyes: Negative for pain, discharge and visual disturbance.  Respiratory: Negative for cough, chest tightness, wheezing and stridor.   Cardiovascular: Negative for chest pain, palpitations and leg swelling.    Gastrointestinal: Negative for abdominal distention, abdominal pain, blood in stool, constipation, diarrhea, nausea and vomiting.  Genitourinary: Negative for difficulty urinating, discharge, flank pain, genital sores, hematuria and urgency.  Musculoskeletal: Negative for arthralgias, back pain, gait problem, joint swelling, myalgias and neck stiffness.  Skin: Negative for rash.  Neurological: Negative for dizziness, syncope, speech difficulty, weakness, numbness and headaches.  Hematological: Negative for adenopathy. Does not bruise/bleed easily.  Psychiatric/Behavioral: Negative for behavioral problems and dysphoric mood. The patient is not nervous/anxious.        Objective:   Physical Exam  Constitutional: He appears well-developed and well-nourished. No distress.  Blood pressure 130/70 Weight 193          Assessment & Plan:   Diabetes mellitus.  Nice clinical response to present therapy. We'll increase metformin to 2 g daily in divided dosages Add Trulicity [id number] milligrams weekly for 2 weeks and then uptitrate to 1.5 milligrams weekly.  Samples and prescriptions dispensed.  He will check with insurance about coverage and preferred agent in this GLP-1 agonist class Decrease insulin to 10 units at bedtime.  Patient will attempt to down titrated and discontinue insulin  Recheck one month  [name]

## 2016-06-14 NOTE — Progress Notes (Signed)
Pre visit review using our clinic review tool, if applicable. No additional management support is needed unless otherwise documented below in the visit note. 

## 2016-06-19 ENCOUNTER — Telehealth: Payer: Self-pay | Admitting: Internal Medicine

## 2016-06-19 NOTE — Telephone Encounter (Signed)
° °  Refill was sent to [facility] pt need the refill sent  [facility] [address]     Pt request refill of the following:   metFORMIN (GLUCOPHAGE) 1000 MG tablet  Phamacy:

## 2016-06-20 MED ORDER — METFORMIN HCL 1000 MG PO TABS: 1000.0000 mg | ORAL_TABLET | Freq: Two times a day (BID) | ORAL | 5 refills | Status: DC

## 2016-06-20 NOTE — Telephone Encounter (Signed)
Medication was refilled and sent over to pharmacy.

## 2016-06-28 ENCOUNTER — Telehealth: Payer: Self-pay | Admitting: Internal Medicine

## 2016-06-28 MED ORDER — DULAGLUTIDE 1.5 MG/0.5ML ~~LOC~~ SOAJ: 1.5000 mg | SUBCUTANEOUS | 6 refills | Status: DC

## 2016-06-28 NOTE — Telephone Encounter (Signed)
Pt need new Rx for TRULICITY  [id number]  Pharm: [facility]

## 2016-06-28 NOTE — Telephone Encounter (Signed)
Rx was refilled and sent over to pharmacy as requested

## 2016-06-29 NOTE — Telephone Encounter (Signed)
Medication Samples have been provided to the patient on Monday [id number]  Trulicity  1.5mg /0.87mL        Qty: 1 box of 2 Single-Dose Pens LOT: [id number]  Exp.Date: 09/26/17    The patient has been instructed regarding the correct time, dose, and frequency of taking this medication, including desired effects and most common side effects.   Francetta Found Jaimes 4:05 PM 06/29/2016

## 2016-06-29 NOTE — Telephone Encounter (Signed)
Pt med trulicity need PA and pt is out and would like sample

## 2016-07-02 NOTE — Telephone Encounter (Signed)
Per [employer], medication does not need a prior authorization, it is already a covered medication.

## 2016-07-03 ENCOUNTER — Ambulatory Visit: Payer: PRIVATE HEALTH INSURANCE

## 2016-07-03 ENCOUNTER — Telehealth: Payer: Self-pay | Admitting: Internal Medicine

## 2016-07-03 NOTE — Telephone Encounter (Signed)
°  Pt said RX was sent to [facility] and he no longer will use them. He asked if the RX can be sent to [facility] on [address]      Pt request refill of the following:  Dulaglutide (TRULICITY) 1.5 0000000 SOPN   Phamacy: [facility]

## 2016-07-04 MED ORDER — DULAGLUTIDE 1.5 MG/0.5ML ~~LOC~~ SOAJ: 1.5000 mg | SUBCUTANEOUS | 6 refills | Status: DC

## 2016-07-04 NOTE — Telephone Encounter (Signed)
Completed.

## 2016-07-06 ENCOUNTER — Ambulatory Visit (INDEPENDENT_AMBULATORY_CARE_PROVIDER_SITE_OTHER): Payer: PRIVATE HEALTH INSURANCE | Admitting: Internal Medicine

## 2016-07-06 ENCOUNTER — Encounter: Payer: Self-pay | Admitting: Internal Medicine

## 2016-07-06 VITALS — BP 142/84 | HR 106 | Temp 98.3°F | Ht 67.0 in | Wt 194.2 lb

## 2016-07-06 DIAGNOSIS — E1165 Type 2 diabetes mellitus with hyperglycemia: Secondary | ICD-10-CM | POA: Diagnosis not present

## 2016-07-06 NOTE — Progress Notes (Signed)
Subjective:    Patient ID: Mark Ward, male    DOB: 1966/01/06, 51 y.o.   MRN: [id number]  HPI  51 year old patient who is seen today for follow-up of diabetes which has recently been diagnosed and treated Presently he is on full dose trulicity and metformin therapy.  Insulin therapy was titrated and discontinued earlier this week Blood sugars remain in a normal range He generally feels well  Past Medical History:  Diagnosis Date  . Bronchitis   . Chronic sinusitis      Social History   Social History  . Marital status: Single    Spouse name: N/A  . Number of children: N/A  . Years of education: N/A   Occupational History  . [employer]   Social History Main Topics  . Smoking status: Never Smoker  . Smokeless tobacco: Never Used  . Alcohol use No  . Drug use: No  . Sexual activity: Not on file   Other Topics Concern  . Not on file   Social History Narrative  . No narrative on file    Past Surgical History:  Procedure Laterality Date  . MOUTH SURGERY      No family history on file.  No Known Allergies  Current Outpatient Prescriptions on File Prior to Visit  Medication Sig Dispense Refill  . albuterol (PROAIR HFA) 108 (90 BASE) MCG/ACT inhaler 2 puffs every 4 hours if needed 1 Inhaler 1  . Aloe-Sodium Chloride (AYR SALINE NASAL GEL NA) As needed    . B Complex-Biotin-FA (VITAMIN B50 COMPLEX PO) Take 1 tablet by mouth daily.    . budesonide-formoterol (SYMBICORT) 160-4.5 MCG/ACT inhaler TAKE 2 PUFFS 1ST THING IN THE MORNING AND ANOTHER 2 PUFFS ABOUT 12 HOURS LATER 10.2 Inhaler 11  . Dulaglutide (TRULICITY) 1.5 NW/2.9FA SOPN Inject 1.5 mg into the skin once a week. 4 pen 6  . fluticasone (FLONASE) 50 MCG/ACT nasal spray Place 1 spray into both nostrils 2 (two) times daily as needed for allergies or rhinitis.    [name] glucose blood (ONETOUCH VERIO) test strip Use as instructed 100 each 12  . guaifenesin (ROBITUSSIN) 100 MG/5ML syrup Take 200 mg by  mouth 3 (three) times daily as needed for cough.    . Ibuprofen (ADVIL) 200 MG CAPS Per bottle directions as needed    . Insulin Glargine (TOUJEO SOLOSTAR) 300 UNIT/ML SOPN Inject 16 Units into the skin at bedtime.    . Insulin Pen Needle 31G X 5 MM MISC Pt is to check blood sugar before breakfast and before evening meal 100 each 3  . L-GLUTAMINE PO Take 1,000 mg by mouth daily.    . Lancets (ONETOUCH ULTRASOFT) lancets Use as instructed 100 each 12  . metFORMIN (GLUCOPHAGE) 1000 MG tablet Take 1 tablet (1,000 mg total) by mouth 2 (two) times daily with a meal. 180 tablet 5  . montelukast (SINGULAIR) 10 MG tablet TAKE 1 TABLET (10 MG TOTAL) BY MOUTH AT BEDTIME. 30 tablet 11  . Multiple Vitamins-Minerals (CENTRUM) tablet Take 1 tablet by mouth daily.    [name] oxymetazoline (AFRIN) 0.05 % nasal spray 2 puffs twice daily x 5 days, off for 2 wks.    . Probiotic Product (PROBIOTIC PO) Take 1 capsule by mouth daily.    [name] pyridOXINE (VITAMIN B-6) 100 MG tablet Take 100 mg by mouth daily.    . Saline GEL as needed.    . sodium chloride (OCEAN) 0.65 % SOLN nasal spray Place 1 spray into both  nostrils as needed for congestion.    . vitamin C (ASCORBIC ACID) 500 MG tablet Take 500 mg by mouth daily.     No current facility-administered medications on file prior to visit.     BP (!) 142/84 (BP Location: Left Arm, Patient Position: Sitting, Cuff Size: Normal)   Pulse (!) 106   Temp 98.3 F (36.8 C) (Oral)   Ht 5\' 7"  (1.702 m)   Wt 194 lb 3.2 oz (88.1 kg)   SpO2 98%   BMI 30.42 kg/m     Review of Systems  Constitutional: Negative.        Objective:   Physical Exam  Constitutional: He appears well-developed and well-nourished. He appears distressed.  Weight 194 Blood pressure 130/80          Assessment & Plan:   Diabetes mellitus History of asthma  Follow-up 3 months with hemoglobin A1c Nonpharmacologic measures discussed  Mark Ward

## 2016-07-06 NOTE — Progress Notes (Signed)
Pre visit review using our clinic review tool, if applicable. No additional management support is needed unless otherwise documented below in the visit note. 

## 2016-07-06 NOTE — Patient Instructions (Signed)
Please check your hemoglobin A1c every 3 months    It is important that you exercise regularly, at least 20 minutes 3 to 4 times per week.  If you develop chest pain or shortness of breath seek  medical attention.  You need to lose weight.  Consider a lower calorie diet and regular exercise. 

## 2016-07-10 ENCOUNTER — Ambulatory Visit: Payer: PRIVATE HEALTH INSURANCE

## 2016-07-17 ENCOUNTER — Ambulatory Visit: Payer: PRIVATE HEALTH INSURANCE

## 2016-08-27 ENCOUNTER — Ambulatory Visit: Payer: 59 | Admitting: Internal Medicine

## 2016-09-13 ENCOUNTER — Encounter: Payer: Self-pay | Admitting: Internal Medicine

## 2016-09-13 ENCOUNTER — Ambulatory Visit (INDEPENDENT_AMBULATORY_CARE_PROVIDER_SITE_OTHER): Payer: PRIVATE HEALTH INSURANCE | Admitting: Internal Medicine

## 2016-09-13 VITALS — BP 132/84 | HR 100 | Ht 67.0 in | Wt 182.0 lb

## 2016-09-13 DIAGNOSIS — J45909 Unspecified asthma, uncomplicated: Secondary | ICD-10-CM | POA: Diagnosis not present

## 2016-09-13 LAB — NITRIC OXIDE: NITRIC OXIDE: 29

## 2016-09-13 MED ORDER — BUDESONIDE-FORMOTEROL FUMARATE 80-4.5 MCG/ACT IN AERO: 2.0000 | INHALATION_SPRAY | Freq: Two times a day (BID) | RESPIRATORY_TRACT | 0 refills | Status: DC

## 2016-09-13 MED ORDER — BUDESONIDE-FORMOTEROL FUMARATE 80-4.5 MCG/ACT IN AERO: 2.0000 | INHALATION_SPRAY | Freq: Two times a day (BID) | RESPIRATORY_TRACT | 11 refills | Status: DC

## 2016-09-13 NOTE — Progress Notes (Signed)
Subjective:     Patient ID: Mark Ward, male   DOB: 14-Jan-1966   MRN: [id number]  Brief patient profile:  61 yobm never smoker never significant allergies or asthma with new onset cough Feb 2014 variably improved then relapsed seen by ent x 3 by UC x3 2, x ER x 3 and finally admitted:  Admit date: 11/24/2012  Discharge date: 11/26/2012  Discharge Diagnoses:   Moderate persistent asthma with exacerbation     07/09/2013 NP  Folllow up and Med review  Returns for follow up and med review .  Reports breathing is doing well since last ov.  feels symptoms are more related to molds at work No SABA use.  Insurance will cover [employer].  Reviewed all his meds and organized them into a med calendar closely w/ pt education . Is following with ENT > sinus surgery 08/04/13 Mark Ward  rec Follow med calendar closely and bring to each visit. Continue on current regimen. Make sure to brush rinse and gargle after inhaler use. Continue followup with ENT    12/12/2015  [name]  Chief Complaint  Patient presents with  . Follow-up    pt states he is doing well, notes minor sinus congestion worse at work.    Very rare saba use / main issues are nasal and f/u Mark Ward  rec Plan A = Automatic = symbicort 160 Take 2 puffs first thing in am and then another 2 puffs about 12 hours later and singulair each am  Plan B = Backup Only use your albuterol as a rescue medication  Please schedule a follow up visit in 3 months but call sooner if needed  Use med calendar    03/15/2016  [name] Chief Complaint  Patient presents with  . Follow-up    Breathing is overall doing well. No new co's today.    main issues continue to be the nasal stuffiness  Does not understand action plan re saba but presently not needing at all    Not limited by breathing from desired activities  But not very active  rec No change in  meds/ bring med calendar to each ov    05/25/2016  [name]  Chief Complaint  Patient presents with  . Acute Visit    Pt c/o increased urination for the past several days.   has not seen PC in 4 years / no eyedrops  Says urine dribbles at work but not at home where has good flow  X 5 days s dysuria or fever but  assoc mouth dryness Plans to see Mark Ward for  Epistaxis   Rarely need proair now  Convince the air ducts at work are causing his nose to be too dry and bleed but no proair required at work rec Please remember to go to the lab  department  > Glucose over 500> admit    09/13/2016  [name]  / symbicort 160 2bid/ singulair  Chief Complaint  Patient presents with  . Follow-up    Breathing is doing well and no co's today.   no need for saba/ no med calendar  Not very active but Not limited by breathing from desired activities    No obvious day to day or daytime variability or assoc excess/ purulent sputum or mucus plugs or hemoptysis or cp or  chest tightness, subjective wheeze or overt sinus or hb symptoms. No unusual exp hx or h/o childhood pna/ asthma or knowledge of premature birth.  Sleeping ok without nocturnal  or early am exacerbation  of respiratory  c/o's or need for noct saba. Also denies any obvious fluctuation of symptoms with weather or environmental changes or other aggravating or alleviating factors except as outlined above   Current Medications, Allergies, Complete Past Medical History, Past Surgical History, Family History, and Social History were reviewed in [employer] record.  ROS  The following are not active complaints unless bolded sore throat, dysphagia, dental problems, itching, sneezing,  nasal congestion or excess/ purulent secretions, ear ache,   fever, chills, sweats, unintended wt loss, classically pleuritic or exertional cp,  orthopnea pnd or leg  swelling, presyncope, palpitations, abdominal pain, anorexia, nausea, vomiting, diarrhea  or change in bowel or bladder habits, change in stools or urine, dysuria,hematuria,  rash, arthralgias, visual complaints, headache, numbness, weakness or ataxia or problems with walking or coordination,  change in mood/affect or memory.               Objective:   Physical Exam    amb bm   Vital signs reviewed - Note on arrival 02 sats 100% RA     01/02/2013  212  > 02/03/2013  215 > 05/12/2013 211 > 06/24/2013  212>210 07/09/13 > 12/30/2013  213 > 01/21/2014  208 >208 02/05/2014 > 03/26/2014 211 > 05/25/2014 216>211 06/22/2014 > 212 03/18/2015 >  09/09/2015   215 >   212 12/12/2015 > 03/15/2016  212 > 05/25/2016   191 > 09/13/2016  182    HEENT: nl dentition, and orophanx  -  mild bilateral non-specific turbinate edema    NECK :  without JVD/Nodes/TM/ nl carotid upstrokes bilaterally   LUNGS: no acc muscle use,  completely clear to A and P bilaterally    CV:  RRR  no s3 or murmur or increase in P2, no edema   ABD:  soft and nontender with nl excursion in the supine position. No bruits or organomegaly, bowel sounds nl  MS:  warm without deformities, calf tenderness, cyanosis or clubbing  SKIN: warm and dry without lesions            Assessment:

## 2016-09-13 NOTE — Assessment & Plan Note (Signed)
-  PFT's 01/02/2013 wnl including fef 25-75 p am dulera 100 x 2 pffs - Med calendar 07/09/2013 > not using 12/30/2013 > not using action plans approp 01/21/2014 , 02/05/2014 , 06/22/2014  Or 05/25/2016   - 09/13/2016 p extensive coaching HFA effectiveness =    90% from baseline 75% - FENO 09/13/2016  =   29 on symb 1602 bid but has aodm so try symb 80 2bid    All goals of chronic asthma control met including optimal function and elimination of symptoms with minimal need for rescue therapy but since now DM would like to see if can get bay with lower dose = 80 2bid   Contingencies discussed in full including contacting this office immediately if not controlling the symptoms using the rule of two's.     I had an extended discussion with the patient reviewing all relevant studies completed to date and  lasting 15 to 20 minutes of a 25 minute visit    Each maintenance medication was reviewed in detail including most importantly the difference between maintenance and prns and under what circumstances the prns are to be triggered using an action plan format that is not reflected in the computer generated alphabetically organized AVS but trather by a customized med calendar that reflects the AVS meds with confirmed 100% correlation.   In addition, Please see AVS for unique instructions that I personally wrote and verbalized to the the pt in detail and then reviewed with pt  by my nurse highlighting any  changes in therapy recommended at today's visit to their plan of care.

## 2016-09-13 NOTE — Patient Instructions (Signed)
Plan A = Automatic = reduce symbicort to 80 Take 2 puffs first thing in am and then another 2 puffs about 12 hours later.    Work on maintaining perfect inhaler technique:  relax and gently blow all the way out then take a nice smooth deep breath back in, triggering the inhaler at same time you start breathing in.  Hold for up to 5 seconds if you can. Blow out thru nose. Rinse and gargle with water when done      Plan B = Backup Only use your albuterol Proair) as a rescue medication to be used if you can't catch your breath by resting or doing a relaxed purse lip breathing pattern.  - The less you use it, the better it will work when you need it. - Ok to use the inhaler up to 2 puffs  every 4 hours if you must but call for appointment if use goes up over your usual need - Don't leave home without it !!  (think of it like the spare tire for your car)   Keep up with med calendar and update it with changes or don't use it at all but an inaccurate version may result in poor care.   If you are satisfied with your treatment plan,  let your doctor know and he/she can either refill your medications or you can return here when your prescription runs out.     If in any way you are not 100% satisfied,  please tell us.  If 100% better, tell your friends!  Pulmonary follow up is as needed

## 2016-10-08 ENCOUNTER — Ambulatory Visit (INDEPENDENT_AMBULATORY_CARE_PROVIDER_SITE_OTHER): Payer: PRIVATE HEALTH INSURANCE | Admitting: Internal Medicine

## 2016-10-08 ENCOUNTER — Encounter: Payer: Self-pay | Admitting: Internal Medicine

## 2016-10-08 VITALS — BP 138/82 | HR 116 | Temp 98.8°F | Ht 67.0 in | Wt 182.2 lb

## 2016-10-08 DIAGNOSIS — J45909 Unspecified asthma, uncomplicated: Secondary | ICD-10-CM

## 2016-10-08 DIAGNOSIS — E1165 Type 2 diabetes mellitus with hyperglycemia: Secondary | ICD-10-CM

## 2016-10-08 LAB — POCT GLYCOSYLATED HEMOGLOBIN (HGB A1C): Hemoglobin A1C: 5.3

## 2016-10-08 MED ORDER — ATORVASTATIN CALCIUM 10 MG PO TABS: 10.0000 mg | ORAL_TABLET | Freq: Every day | ORAL | 3 refills | Status: DC

## 2016-10-08 NOTE — Progress Notes (Signed)
Subjective:    Patient ID: Mark Ward, male    DOB: Jan 14, 1966, 51 y.o.   MRN: [id number]  HPI  51 year old patient who is seen today for follow-up of type 2 diabetes. Glycemic control has been good.  Blood sugars are frequently less than 100 He has asthma, which has been stable.  He has been followed by pulmonary medicine, but now as needed only.  His father recently died of complications of PD and pneumonia  Past Medical History:  Diagnosis Date  . Bronchitis   . Chronic sinusitis      Social History   Social History  . Marital status: Single    Spouse name: N/A  . Number of children: N/A  . Years of education: N/A   Occupational History  . [employer] Service [employer]   Social History Main Topics  . Smoking status: Never Smoker  . Smokeless tobacco: Never Used  . Alcohol use No  . Drug use: No  . Sexual activity: Not on file   Other Topics Concern  . Not on file   Social History Narrative  . No narrative on file    Past Surgical History:  Procedure Laterality Date  . MOUTH SURGERY      No family history on file.  No Known Allergies  Current Outpatient Prescriptions on File Prior to Visit  Medication Sig Dispense Refill  . acetaminophen (TYLENOL) 500 MG tablet Take 500 mg by mouth every 6 (six) hours as needed.    [name] albuterol (PROAIR HFA) 108 (90 BASE) MCG/ACT inhaler 2 puffs every 4 hours if needed 1 Inhaler 1  . Aloe-Sodium Chloride (AYR SALINE NASAL GEL NA) As needed    . B Complex-Biotin-FA (VITAMIN B50 COMPLEX PO) Take 1 tablet by mouth daily.    . budesonide-formoterol (SYMBICORT) 80-4.5 MCG/ACT inhaler Inhale 2 puffs into the lungs 2 (two) times daily. 1 Inhaler 11  . cholecalciferol (VITAMIN D) 400 units TABS tablet Take 400 Units by mouth daily.    . Chromium Picolinate (CHROMIUM PICOLATE) 1000 MCG TABS Take 1,000 mg by mouth daily.    . Dulaglutide (TRULICITY) 1.5 BD/5.3GD SOPN Inject 1.5 mg into the skin once a week. 4 pen 6  . fluticasone  (FLONASE) 50 MCG/ACT nasal spray Place 1 spray into both nostrils 2 (two) times daily as needed for allergies or rhinitis.    [name] glucose blood (ONETOUCH VERIO) test strip Use as instructed 100 each 12  . guaifenesin (ROBITUSSIN) 100 MG/5ML syrup Take 200 mg by mouth 3 (three) times daily as needed for cough.    . Insulin Pen Needle 31G X 5 MM MISC Pt is to check blood sugar before breakfast and before evening meal 100 each 3  . L-GLUTAMINE PO Take 1,000 mg by mouth daily.    . Lancets (ONETOUCH ULTRASOFT) lancets Use as instructed 100 each 12  . metFORMIN (GLUCOPHAGE) 1000 MG tablet Take 1 tablet (1,000 mg total) by mouth 2 (two) times daily with a meal. 180 tablet 5  . montelukast (SINGULAIR) 10 MG tablet TAKE 1 TABLET (10 MG TOTAL) BY MOUTH AT BEDTIME. 30 tablet 11  . Multiple Vitamins-Minerals (CENTRUM) tablet Take 1 tablet by mouth daily.    [name] oxymetazoline (AFRIN) 0.05 % nasal spray 2 puffs twice daily x 5 days, off for 2 wks.    . Probiotic Product (PROBIOTIC PO) Take 1 capsule by mouth daily.    [name] pyridOXINE (VITAMIN B-6) 100 MG tablet Take 100 mg by mouth daily.    [name]  Saline GEL as needed.    . sodium chloride (OCEAN) 0.65 % SOLN nasal spray Place 1 spray into both nostrils as needed for congestion.    . vitamin C (ASCORBIC ACID) 500 MG tablet Take 500 mg by mouth daily.     No current facility-administered medications on file prior to visit.     BP 138/82 (BP Location: Left Arm, Patient Position: Sitting, Cuff Size: Normal)   Pulse (!) 116   Temp 98.8 F (37.1 C) (Oral)   Ht 5\' 7"  (1.702 m)   Wt 182 lb 3.2 oz (82.6 kg)   SpO2 98%   BMI 28.54 kg/m     Review of Systems  Constitutional: Negative for appetite change, chills, fatigue and fever.  HENT: Negative for congestion, dental problem, ear pain, hearing loss, sore throat, tinnitus, trouble swallowing and voice change.   Eyes: Negative for pain, discharge and visual disturbance.  Respiratory: Negative for cough, chest  tightness, wheezing and stridor.   Cardiovascular: Negative for chest pain, palpitations and leg swelling.  Gastrointestinal: Negative for abdominal distention, abdominal pain, blood in stool, constipation, diarrhea, nausea and vomiting.  Genitourinary: Negative for difficulty urinating, discharge, flank pain, genital sores, hematuria and urgency.  Musculoskeletal: Negative for arthralgias, back pain, gait problem, joint swelling, myalgias and neck stiffness.  Skin: Negative for rash.  Neurological: Negative for dizziness, syncope, speech difficulty, weakness, numbness and headaches.  Hematological: Negative for adenopathy. Does not bruise/bleed easily.  Psychiatric/Behavioral: Negative for behavioral problems and dysphoric mood. The patient is not nervous/anxious.        Objective:   Physical Exam  Constitutional: He is oriented to person, place, and time. He appears well-developed.  HENT:  Head: Normocephalic.  Right Ear: External ear normal.  Left Ear: External ear normal.  Eyes: Conjunctivae and EOM are normal.  Neck: Normal range of motion.  Cardiovascular: Normal rate and normal heart sounds.   Pulse 100  Pulmonary/Chest: Breath sounds normal.  Abdominal: Bowel sounds are normal.  Musculoskeletal: Normal range of motion. He exhibits no edema or tenderness.  Neurological: He is alert and oriented to person, place, and time.  Psychiatric: He has a normal mood and affect. His behavior is normal.          Assessment & Plan:   Diabetes mellitus.  Will review hemoglobin A1c.  Annual eye examination recommended.  Start statin therapy Asthma, stable  Hemoglobin A1c 5.3  Return in 6 months for follow-up  [name]

## 2016-10-08 NOTE — Patient Instructions (Addendum)
WE NOW OFFER   [facility] FAST TRACK!!!  SAME DAY Appointments for ACUTE CARE  Such as: Sprains, Injuries, cuts, abrasions, rashes, muscle pain, joint pain, back pain Colds, flu, sore throats, headache, allergies, cough, fever  Ear pain, sinus and eye infections Abdominal pain, nausea, vomiting, diarrhea, upset stomach Animal/insect bites  3 Easy Ways to Schedule: Walk-In Scheduling Call in scheduling Mychart Sign-up: [email or web address]     Please check your hemoglobin A1c every 3-6  Months    It is important that you exercise regularly, at least 20 minutes 3 to 4 times per week.  If you develop chest pain or shortness of breath seek  medical attention.  Please see your eye doctor yearly to check for diabetic eye damage

## 2017-01-17 ENCOUNTER — Encounter: Payer: Self-pay | Admitting: Internal Medicine

## 2017-03-30 ENCOUNTER — Other Ambulatory Visit: Payer: Self-pay | Admitting: Internal Medicine

## 2017-04-09 ENCOUNTER — Ambulatory Visit: Payer: PRIVATE HEALTH INSURANCE | Admitting: Internal Medicine

## 2017-04-16 ENCOUNTER — Encounter: Payer: Self-pay | Admitting: Internal Medicine

## 2017-04-16 ENCOUNTER — Ambulatory Visit (INDEPENDENT_AMBULATORY_CARE_PROVIDER_SITE_OTHER): Payer: PRIVATE HEALTH INSURANCE | Admitting: Internal Medicine

## 2017-04-16 VITALS — BP 144/82 | HR 108 | Temp 98.4°F | Ht 67.0 in | Wt 177.8 lb

## 2017-04-16 DIAGNOSIS — Z23 Encounter for immunization: Secondary | ICD-10-CM | POA: Diagnosis not present

## 2017-04-16 DIAGNOSIS — E119 Type 2 diabetes mellitus without complications: Secondary | ICD-10-CM | POA: Diagnosis not present

## 2017-04-16 DIAGNOSIS — J45909 Unspecified asthma, uncomplicated: Secondary | ICD-10-CM | POA: Diagnosis not present

## 2017-04-16 DIAGNOSIS — E1165 Type 2 diabetes mellitus with hyperglycemia: Secondary | ICD-10-CM

## 2017-04-16 LAB — POCT GLYCOSYLATED HEMOGLOBIN (HGB A1C): HEMOGLOBIN A1C: 5.3

## 2017-04-16 NOTE — Progress Notes (Signed)
Subjective:    Patient ID: Mark Ward, male    DOB: Jan 08, 1966, 51 y.o.   MRN: [id number]  HPI  Lab Results  Component Value Date   HGBA1C 5.3 10/08/2016    Wt Readings from Last 3 Encounters:  04/16/17 177 lb 12.8 oz (80.6 kg)  10/08/16 182 lb 3.2 oz (82.6 kg)  09/13/16 182 lb (82.63 kg)    51 year old patient who is seen today for follow-up of type 2 diabetes. This has been quite well controlled. Except for some situational stress he has done quite well.  He does have some mild asthma that is well controlled.\No concerns or complaints.  Weight is down modestly over the past 6 months  Repeat hemoglobin A1c today unchanged at 5.3.  Past Medical History:  Diagnosis Date  . Bronchitis   . Chronic sinusitis      Social History   Socioeconomic History  . Marital status: Single    Spouse name: Not on file  . Number of children: Not on file  . Years of education: Not on file  . Highest education level: Not on file  Social Needs  . Financial resource strain: Not on file  . Food insecurity - worry: Not on file  . Food insecurity - inability: Not on file  . Transportation needs - medical: Not on file  . Transportation needs - non-medical: Not on file  Occupational History  . Occupation: Research scientist (physical sciences): [employer]  Tobacco Use  . Smoking status: Never Smoker  . Smokeless tobacco: Never Used  Substance and Sexual Activity  . Alcohol use: No  . Drug use: No  . Sexual activity: Not on file  Other Topics Concern  . Not on file  Social History Narrative  . Not on file    Past Surgical History:  Procedure Laterality Date  . MOUTH SURGERY      History reviewed. No pertinent family history.  No Known Allergies  Current Outpatient Medications on File Prior to Visit  Medication Sig Dispense Refill  . acetaminophen (TYLENOL) 500 MG tablet Take 500 mg by mouth every 6 (six) hours as needed.    [name] albuterol (PROAIR HFA) 108 (90 BASE) MCG/ACT inhaler 2 puffs  every 4 hours if needed 1 Inhaler 1  . Aloe-Sodium Chloride (AYR SALINE NASAL GEL NA) As needed    . atorvastatin (LIPITOR) 10 MG tablet Take 1 tablet (10 mg total) by mouth daily. 90 tablet 3  . B Complex-Biotin-FA (VITAMIN B50 COMPLEX PO) Take 1 tablet by mouth daily.    . budesonide-formoterol (SYMBICORT) 80-4.5 MCG/ACT inhaler Inhale 2 puffs into the lungs 2 (two) times daily. 1 Inhaler 11  . cholecalciferol (VITAMIN D) 400 units TABS tablet Take 400 Units by mouth daily.    . Chromium Picolinate (CHROMIUM PICOLATE) 1000 MCG TABS Take 1,000 mg by mouth daily.    . Dulaglutide (TRULICITY) 1.5 NW/2.9FA SOPN Inject 1.5 mg into the skin once a week. 4 pen 6  . fluticasone (FLONASE) 50 MCG/ACT nasal spray Place 1 spray into both nostrils 2 (two) times daily as needed for allergies or rhinitis.    [name] glucose blood (ONETOUCH VERIO) test strip Use as instructed 100 each 12  . guaifenesin (ROBITUSSIN) 100 MG/5ML syrup Take 200 mg by mouth 3 (three) times daily as needed for cough.    . Insulin Pen Needle 31G X 5 MM MISC Pt is to check blood sugar before breakfast and before evening meal 100 each 3  .  L-GLUTAMINE PO Take 1,000 mg by mouth daily.    . Lancets (ONETOUCH ULTRASOFT) lancets Use as instructed 100 each 12  . metFORMIN (GLUCOPHAGE) 1000 MG tablet Take 1 tablet (1,000 mg total) by mouth 2 (two) times daily with a meal. 180 tablet 5  . montelukast (SINGULAIR) 10 MG tablet TAKE 1 TABLET (10 MG TOTAL) BY MOUTH AT BEDTIME. 30 tablet 0  . Multiple Vitamins-Minerals (CENTRUM) tablet Take 1 tablet by mouth daily.    [name] oxymetazoline (AFRIN) 0.05 % nasal spray 2 puffs twice daily x 5 days, off for 2 wks.    . Probiotic Product (PROBIOTIC PO) Take 1 capsule by mouth daily.    [name] pyridOXINE (VITAMIN B-6) 100 MG tablet Take 100 mg by mouth daily.    . Saline GEL as needed.    . Selenium 100 MCG CAPS Take 100 mcg by mouth daily.    . sodium chloride (OCEAN) 0.65 % SOLN nasal spray Place 1 spray into both  nostrils as needed for congestion.    . vitamin C (ASCORBIC ACID) 500 MG tablet Take 500 mg by mouth daily.     No current facility-administered medications on file prior to visit.     BP (!) 144/82 (BP Location: Left Arm, Patient Position: Sitting, Cuff Size: Normal)   Pulse (!) 108   Temp 98.4 F (36.9 C) (Oral)   Ht 5\' 7"  (1.702 m)   Wt 177 lb 12.8 oz (80.6 kg)   SpO2 97%   BMI 27.85 kg/m     Review of Systems  Constitutional: Negative for appetite change, chills, fatigue and fever.  HENT: Negative for congestion, dental problem, ear pain, hearing loss, sore throat, tinnitus, trouble swallowing and voice change.   Eyes: Negative for pain, discharge and visual disturbance.  Respiratory: Negative for cough, chest tightness, wheezing and stridor.   Cardiovascular: Negative for chest pain, palpitations and leg swelling.  Gastrointestinal: Negative for abdominal distention, abdominal pain, blood in stool, constipation, diarrhea, nausea and vomiting.  Genitourinary: Negative for difficulty urinating, discharge, flank pain, genital sores, hematuria and urgency.  Musculoskeletal: Negative for arthralgias, back pain, gait problem, joint swelling, myalgias and neck stiffness.  Skin: Negative for rash.  Neurological: Negative for dizziness, syncope, speech difficulty, weakness, numbness and headaches.  Hematological: Negative for adenopathy. Does not bruise/bleed easily.  Psychiatric/Behavioral: Negative for behavioral problems and dysphoric mood. The patient is nervous/anxious.        Objective:   Physical Exam  Constitutional: He is oriented to person, place, and time. He appears well-developed.  Blood pressure 130/90  HENT:  Head: Normocephalic.  Right Ear: External ear normal.  Left Ear: External ear normal.  Eyes: Conjunctivae and EOM are normal.  Neck: Normal range of motion.  Cardiovascular: Normal rate, regular rhythm and normal heart sounds.  Right 100  Pulmonary/Chest:  Breath sounds normal.  Abdominal: Bowel sounds are normal.  Musculoskeletal: Normal range of motion. He exhibits no edema or tenderness.  Neurological: He is alert and oriented to person, place, and time.  Psychiatric: He has a normal mood and affect. His behavior is normal.          Assessment & Plan:   Diabetes mellitus type 2 excellent control Situational stress.  Blood pressure pulse rate both slightly elevated exercise regimen discussed.  Will place on a DASH diet  CPX 6 months  Flu vaccine dispensed  [name]

## 2017-04-16 NOTE — Patient Instructions (Addendum)
Limit your sodium (Salt) intake    It is important that you exercise regularly, at least 20 minutes 3 to 4 times per week.  If you develop chest pain or shortness of breath seek  medical attention.   Please check your hemoglobin A1c every 6 months    DASH Eating Plan DASH stands for "Dietary Approaches to Stop Hypertension." The DASH eating plan is a healthy eating plan that has been shown to reduce high blood pressure (hypertension). It may also reduce your risk for type 2 diabetes, heart disease, and stroke. The DASH eating plan may also help with weight loss. What are tips for following this plan? General guidelines  Avoid eating more than 2,300 mg (milligrams) of salt (sodium) a day. If you have hypertension, you may need to reduce your sodium intake to 1,500 mg a day.  Limit alcohol intake to no more than 1 drink a day for nonpregnant women and 2 drinks a day for men. One drink equals 12 oz of beer, 5 oz of wine, or 1 oz of hard liquor.  Work with your health care provider to maintain a healthy body weight or to lose weight. Ask what an ideal weight is for you.  Get at least 30 minutes of exercise that causes your heart to beat faster (aerobic exercise) most days of the week. Activities may include walking, swimming, or biking.  Work with your health care provider or diet and nutrition specialist (dietitian) to adjust your eating plan to your individual calorie needs. Reading food labels  Check food labels for the amount of sodium per serving. Choose foods with less than 5 percent of the Daily Value of sodium. Generally, foods with less than 300 mg of sodium per serving fit into this eating plan.  To find whole grains, look for the word "whole" as the first word in the ingredient list. Shopping  Buy products labeled as "low-sodium" or "no salt added."  Buy fresh foods. Avoid canned foods and premade or frozen meals. Cooking  Avoid adding salt when cooking. Use salt-free  seasonings or herbs instead of table salt or sea salt. Check with your health care provider or pharmacist before using salt substitutes.  Do not fry foods. Cook foods using healthy methods such as baking, boiling, grilling, and broiling instead.  Cook with heart-healthy oils, such as olive, canola, soybean, or sunflower oil. Meal planning   Eat a balanced diet that includes: ? 5 or more servings of fruits and vegetables each day. At each meal, try to fill half of your plate with fruits and vegetables. ? Up to 6-8 servings of whole grains each day. ? Less than 6 oz of lean meat, poultry, or fish each day. A 3-oz serving of meat is about the same size as a deck of cards. One egg equals 1 oz. ? 2 servings of low-fat dairy each day. ? A serving of nuts, seeds, or beans 5 times each week. ? Heart-healthy fats. Healthy fats called Omega-3 fatty acids are found in foods such as flaxseeds and coldwater fish, like sardines, salmon, and mackerel.  Limit how much you eat of the following: ? Canned or prepackaged foods. ? Food that is high in trans fat, such as fried foods. ? Food that is high in saturated fat, such as fatty meat. ? Sweets, desserts, sugary drinks, and other foods with added sugar. ? Full-fat dairy products.  Do not salt foods before eating.  Try to eat at least 2 vegetarian meals each  week.  Eat more home-cooked food and less restaurant, buffet, and fast food.  When eating at a restaurant, ask that your food be prepared with less salt or no salt, if possible. What foods are recommended? The items listed may not be a complete list. Talk with your dietitian about what dietary choices are best for you. Grains Whole-grain or whole-wheat bread. Whole-grain or whole-wheat pasta. Brown rice. [place]. Bulgur. Whole-grain and low-sodium cereals. Pita bread. Low-fat, low-sodium crackers. Whole-wheat flour tortillas. Vegetables Fresh or frozen vegetables (raw, steamed, roasted,  or grilled). Low-sodium or reduced-sodium tomato and vegetable juice. Low-sodium or reduced-sodium tomato sauce and tomato paste. Low-sodium or reduced-sodium canned vegetables. Fruits All fresh, dried, or frozen fruit. Canned fruit in natural juice (without added sugar). Meat and other protein foods Skinless chicken or [place]. Ground chicken or [place]. Pork with fat trimmed off. Fish and seafood. Egg whites. Dried beans, peas, or lentils. Unsalted nuts, nut butters, and seeds. Unsalted canned beans. Lean cuts of beef with fat trimmed off. Low-sodium, lean deli meat. Dairy Low-fat (1%) or fat-free (skim) milk. Fat-free, low-fat, or reduced-fat cheeses. Nonfat, low-sodium ricotta or cottage cheese. Low-fat or nonfat yogurt. Low-fat, low-sodium cheese. Fats and oils Soft margarine without trans fats. Vegetable oil. Low-fat, reduced-fat, or light mayonnaise and salad dressings (reduced-sodium). Canola, safflower, olive, soybean, and sunflower oils. Avocado. Seasoning and other foods Herbs. Spices. Seasoning mixes without salt. Unsalted popcorn and pretzels. Fat-free sweets. What foods are not recommended? The items listed may not be a complete list. Talk with your dietitian about what dietary choices are best for you. Grains Baked goods made with fat, such as croissants, muffins, or some breads. Dry pasta or rice meal packs. Vegetables Creamed or fried vegetables. Vegetables in a cheese sauce. Regular canned vegetables (not low-sodium or reduced-sodium). Regular canned tomato sauce and paste (not low-sodium or reduced-sodium). Regular tomato and vegetable juice (not low-sodium or reduced-sodium). Angie Fava. Olives. Fruits Canned fruit in a light or heavy syrup. Fried fruit. Fruit in cream or butter sauce. Meat and other protein foods Fatty cuts of meat. Ribs. Fried meat. Berniece Salines. Sausage. Bologna and other processed lunch meats. Salami. Fatback. Hotdogs. Bratwurst. Salted nuts and seeds. Canned beans  with added salt. Canned or smoked fish. Whole eggs or egg yolks. Chicken or [place] with skin. Dairy Whole or 2% milk, cream, and half-and-half. Whole or full-fat cream cheese. Whole-fat or sweetened yogurt. Full-fat cheese. Nondairy creamers. Whipped toppings. Processed cheese and cheese spreads. Fats and oils Butter. Stick margarine. Lard. Shortening. Ghee. Bacon fat. Tropical oils, such as coconut, palm kernel, or palm oil. Seasoning and other foods Salted popcorn and pretzels. Onion salt, garlic salt, seasoned salt, table salt, and sea salt. Worcestershire sauce. Tartar sauce. Barbecue sauce. Teriyaki sauce. Soy sauce, including reduced-sodium. Steak sauce. Canned and packaged gravies. Fish sauce. Oyster sauce. Cocktail sauce. Horseradish that you find on the shelf. Ketchup. Mustard. Meat flavorings and tenderizers. Bouillon cubes. Hot sauce and Tabasco sauce. Premade or packaged marinades. Premade or packaged taco seasonings. Relishes. Regular salad dressings. Where to find more information:  National Heart, Lung, and [place]: https://wilson-eaton.com/  American Heart Association: www.heart.org Summary  The DASH eating plan is a healthy eating plan that has been shown to reduce high blood pressure (hypertension). It may also reduce your risk for type 2 diabetes, heart disease, and stroke.  With the DASH eating plan, you should limit salt (sodium) intake to 2,300 mg a day. If you have hypertension, you may need to reduce your  sodium intake to 1,500 mg a day.  When on the DASH eating plan, aim to eat more fresh fruits and vegetables, whole grains, lean proteins, low-fat dairy, and heart-healthy fats.  Work with your health care provider or diet and nutrition specialist (dietitian) to adjust your eating plan to your individual calorie needs. This information is not intended to replace advice given to you by your health care provider. Make sure you discuss any questions you have with your health  care provider. Document Released: 04/05/2011 Document Revised: 04/09/2016 Document Reviewed: 04/09/2016 Elsevier Interactive Patient Education  2017 [employer].

## 2017-04-26 ENCOUNTER — Telehealth: Payer: Self-pay | Admitting: Internal Medicine

## 2017-04-26 MED ORDER — BUDESONIDE-FORMOTEROL FUMARATE 80-4.5 MCG/ACT IN AERO: 2.0000 | INHALATION_SPRAY | Freq: Two times a day (BID) | RESPIRATORY_TRACT | 11 refills | Status: DC

## 2017-04-26 NOTE — Telephone Encounter (Signed)
Rx for Symbicort has been sent to preferred pharmacy. Pt is aware and voiced his understanding. Nothing further is needed.

## 2017-04-26 NOTE — Telephone Encounter (Signed)
lmtcb x1 for pt. 

## 2017-04-29 ENCOUNTER — Other Ambulatory Visit: Payer: Self-pay | Admitting: Internal Medicine

## 2017-04-29 MED ORDER — BUDESONIDE-FORMOTEROL FUMARATE 80-4.5 MCG/ACT IN AERO: 2.0000 | INHALATION_SPRAY | Freq: Two times a day (BID) | RESPIRATORY_TRACT | 11 refills | Status: DC

## 2017-05-03 ENCOUNTER — Other Ambulatory Visit: Payer: Self-pay | Admitting: Internal Medicine

## 2017-05-24 ENCOUNTER — Ambulatory Visit: Payer: Self-pay

## 2017-05-24 ENCOUNTER — Telehealth: Payer: Self-pay | Admitting: Internal Medicine

## 2017-05-24 NOTE — Telephone Encounter (Signed)
Attempted to call patient regarding his symptoms and what to take regarding his diabetes. No answer. Left message for patient to call back.

## 2017-05-24 NOTE — Telephone Encounter (Signed)
Patient returned call asking about taking advil sinus cold for his cold symtoms with all the other medications he takes, patient advised to take tylenol sinus instead due to the effects of NSAIDS with diabetic patients according to the literature, patient verbalized understanding.

## 2017-05-24 NOTE — Telephone Encounter (Signed)
Patient called at [phone], left VM to return call to office for question related to taking tylenol sinus.

## 2017-05-30 ENCOUNTER — Telehealth: Payer: Self-pay | Admitting: Internal Medicine

## 2017-05-30 NOTE — Telephone Encounter (Signed)
Spoke with pt, a discount card was placed up front for him to pick up. Pt understood and nothing further is needed.

## 2017-05-30 NOTE — Telephone Encounter (Signed)
Yes for commercial insurance there is one on the right corner of my work station

## 2017-05-30 NOTE — Telephone Encounter (Signed)
Dr. Melvyn Novas, please advise if you have a symbicort discount card we can give to pt to help out with payment on med.  Thanks!

## 2017-06-24 ENCOUNTER — Ambulatory Visit (INDEPENDENT_AMBULATORY_CARE_PROVIDER_SITE_OTHER): Payer: PRIVATE HEALTH INSURANCE | Admitting: Internal Medicine

## 2017-06-24 ENCOUNTER — Encounter: Payer: Self-pay | Admitting: Internal Medicine

## 2017-06-24 VITALS — BP 124/76 | HR 108 | Temp 98.3°F | Ht 67.0 in | Wt 173.8 lb

## 2017-06-24 DIAGNOSIS — J4531 Mild persistent asthma with (acute) exacerbation: Secondary | ICD-10-CM

## 2017-06-24 DIAGNOSIS — J310 Chronic rhinitis: Secondary | ICD-10-CM

## 2017-06-24 MED ORDER — PREDNISONE 10 MG PO TABS: ORAL_TABLET | ORAL | 0 refills | Status: DC

## 2017-06-24 MED ORDER — AMOXICILLIN-POT CLAVULANATE 875-125 MG PO TABS: 1.0000 | ORAL_TABLET | Freq: Two times a day (BID) | ORAL | 0 refills | Status: AC

## 2017-06-24 MED ORDER — ALBUTEROL SULFATE HFA 108 (90 BASE) MCG/ACT IN AERS: INHALATION_SPRAY | RESPIRATORY_TRACT | 1 refills | Status: DC

## 2017-06-24 NOTE — Patient Instructions (Addendum)
Prednisone 10 mg take  4 each am x 2 days,   2 each am x 2 days,  1 each am x 2 days and stop  Augmentin 875 mg take one pill twice daily  X 10 days - take at breakfast and supper with large glass of water.  It would help reduce the usual side effects (diarrhea and yeast infections) if you ate cultured yogurt at lunch.    Plan A = Automatic = symbicort 80 Take 2 puffs first thing in am and then another 2 puffs about 12 hours later.     Work on inhaler technique:  relax and gently blow all the way out then take a nice smooth deep breath back in, triggering the inhaler at same time you start breathing in.  Hold for up to 5 seconds if you can. Blow out thru nose. Rinse and gargle with water when done     Plan B = Backup Only use your albuterol as a rescue medication to be used if you can't catch your breath by resting or doing a relaxed purse lip breathing pattern.  - The less you use it, the better it will work when you need it. - Ok to use the inhaler up to 2 puffs  every 4 hours if you must but call for appointment if use goes up over your usual need - Don't leave home without it !!  (think of it like the spare tire for your car)    Please schedule a follow up visit in 3 months but call sooner if needed to see Mark Ward for new med calendar so bring all meds with you to this visit

## 2017-06-24 NOTE — Progress Notes (Signed)
Subjective:     Patient ID: Mark Ward, male   DOB: 04/26/1966   MRN: [id number]  Brief patient profile:  67 yobm never smoker never significant allergies or asthma with new onset cough Feb 2014 variably improved then relapsed seen by ent x 3 by UC x3 2, x ER x 3 and finally admitted:   Admit date: 11/24/2012  Discharge date: 11/26/2012  Discharge Diagnoses:   Moderate persistent asthma with exacerbation     07/09/2013 NP  Folllow up and Med review  Returns for follow up and med review .  Reports breathing is doing well since last ov.  feels symptoms are more related to molds at work No SABA use.  Insurance will cover [employer].  Reviewed all his meds and organized them into a med calendar closely w/ pt education . Is following with ENT > sinus surgery 08/04/13 Mark Ward  rec Follow med calendar closely and bring to each visit. Continue on current regimen. Make sure to brush rinse and gargle after inhaler use. Continue followup with ENT    12/12/2015  f/u ov/Belma Dyches re: chronic asthma/ maint rx symbicort 160 2bid/singulair  Chief Complaint  Patient presents with  . Follow-up    pt states he is doing well, notes minor sinus congestion worse at work.    Very rare saba use / main issues are nasal and f/u Mark Ward  rec Plan A = Automatic = symbicort 160 Take 2 puffs first thing in am and then another 2 puffs about 12 hours later and singulair each am  Plan B = Backup Only use your albuterol as a rescue medication  Please schedule a follow up visit in 3 months but call sooner if needed  Use med calendar    03/15/2016  [name] Chief Complaint  Patient presents with  . Follow-up    Breathing is overall doing well. No new co's today.    main issues continue to be the nasal stuffiness  Does not understand action plan re saba but presently not needing at all    Not limited by breathing from desired activities  But not very active  rec No change  in meds/ bring med calendar to each ov    05/25/2016  [name]  Chief Complaint  Patient presents with  . Acute Visit    Pt c/o increased urination for the past several days.   has not seen PC in 4 years / no eyedrops  Says urine dribbles at work but not at home where has good flow  X 5 days s dysuria or fever but  assoc mouth dryness Plans to see Mark Ward for  Epistaxis   Rarely need proair now  Convince the air ducts at work are causing his nose to be too dry and bleed but no proair required at work rec Please remember to go to the lab  department  > Glucose over 500> admit    09/13/2016  [name]  / symbicort 160 2bid/ singulair  Chief Complaint  Patient presents with  . Follow-up    Breathing is doing well and no co's today.   no need for saba/ no med calendar Not very active but Not limited by breathing from desired activities   rec Plan A = Automatic = reduce symbicort to 80 Take 2 puffs first thing in am and then another 2 puffs  about 12 hours later.  Work on maintaining perfect inhaler technique:   Plan B = Backup Only use your albuterol Proair) as a rescue medication  Keep up with med calendar and update it with changes or don't use it at all but an inaccurate version may result in poor care.    06/24/2017  f/u ov/Mark Ward re: acute astham exac  Chief Complaint  Patient presents with  . Acute Visit    productive cough,yellow sputum,tried tylenol cold,apple cider vinager,throat has some soreness,chest congestion   prior 1st week in Feb 2019 on rare  saba maint on symb 80 and doing fine with ex/ sleep Then gradually worse cough/ sob with onset  nasal congestion >no rx Dyspnea:  Now sometimes at rest, better p saba but ran out "I guess I used too much" about a week agao  Cough: worse x one week/  ? Worse p supper and better in bed and sleeping flat fine   No obvious day to day or daytime variability  or assoc excess/ purulent sputum or mucus plugs or hemoptysis or cp or chest tightness, subjective wheeze or overt sinus or hb symptoms. No unusual exposure hx or h/o childhood pna/ asthma or knowledge of premature birth.  Sleeping ok flat without nocturnal  or early am exacerbation  of respiratory  c/o's or need for noct saba. Also denies any obvious fluctuation of symptoms with weather or environmental changes or other aggravating or alleviating factors except as outlined above   Current Allergies, Complete Past Medical History, Past Surgical History, Family History, and Social History were reviewed in [employer] record.  ROS  The following are not active complaints unless bolded Hoarseness, sore throat, dysphagia, dental problems, itching, sneezing,  nasal congestion or discharge of excess mucus or purulent secretions, ear ache,   fever, chills, sweats, unintended wt loss or wt gain, classically pleuritic or exertional cp,  orthopnea pnd or leg swelling, presyncope, palpitations, abdominal pain, anorexia, nausea, vomiting, diarrhea  or change in bowel habits or change in bladder habits, change in stools or change in urine, dysuria, hematuria,  rash, arthralgias, visual complaints, headache, numbness, weakness or ataxia or problems with walking or coordination,  change in mood/affect or memory.        Current Meds  Medication Sig  . acetaminophen (TYLENOL) 500 MG tablet Take 500 mg by mouth every 6 (six) hours as needed.  . Aloe-Sodium Chloride (AYR SALINE NASAL GEL NA) As needed  . atorvastatin (LIPITOR) 10 MG tablet Take 1 tablet (10 mg total) by mouth daily.  . B Complex-Biotin-FA (VITAMIN B50 COMPLEX PO) Take 1 tablet by mouth daily.  . budesonide-formoterol (SYMBICORT) 80-4.5 MCG/ACT inhaler Inhale 2 puffs into the lungs 2 (two) times daily.  . cholecalciferol (VITAMIN D) 400 units TABS tablet Take 5,000 Units by mouth daily.   . Dulaglutide (TRULICITY) 1.5 QQ/5.9DG  SOPN Inject 1.5 mg into the skin once a week.  . fluticasone (FLONASE) 50 MCG/ACT nasal spray Place 1 spray into both nostrils 2 (two) times daily as needed for allergies or rhinitis.  [name] glucose blood (ONETOUCH VERIO) test strip Use as instructed  . guaifenesin (ROBITUSSIN) 100 MG/5ML syrup Take 200 mg by mouth 3 (three) times daily as needed for cough.  . Insulin Pen Needle 31G X 5 MM MISC Pt is to check blood sugar before breakfast and before evening meal  . L-GLUTAMINE PO Take 1,000 mg by mouth daily.  . Lancets (ONETOUCH ULTRASOFT) lancets Use as instructed  .  Magnesium Oxide (MAG-CAPS PO) Take by mouth daily.  . metFORMIN (GLUCOPHAGE) 1000 MG tablet Take 1 tablet (1,000 mg total) by mouth 2 (two) times daily with a meal.  . montelukast (SINGULAIR) 10 MG tablet TAKE 1 TABLET BY MOUTH EVERYDAY AT BEDTIME  . Multiple Vitamins-Minerals (CENTRUM) tablet Take 1 tablet by mouth daily.  [name] oxymetazoline (AFRIN) 0.05 % nasal spray 2 puffs twice daily x 5 days, off for 2 wks.  . Probiotic Product (PROBIOTIC PO) Take 1 capsule by mouth daily.  [name] pyridOXINE (VITAMIN B-6) 100 MG tablet Take 100 mg by mouth daily.  . Saline GEL as needed.  . Selenium 100 MCG CAPS Take 100 mcg by mouth daily.  . sodium chloride (OCEAN) 0.65 % SOLN nasal spray Place 1 spray into both nostrils as needed for congestion.  . vitamin C (ASCORBIC ACID) 500 MG tablet Take 500 mg by mouth daily.             Objective:   Physical Exam  Amb bm nasal tone to voice    Vital signs reviewed - Note on arrival 02 sats  96% on RA      01/02/2013  212  > 02/03/2013  215 > 05/12/2013 211 > 06/24/2013  212>210 07/09/13 > 12/30/2013  213 > 01/21/2014  208 >208 02/05/2014 > 03/26/2014 211 > 05/25/2014 216>211 06/22/2014 > 212 03/18/2015 >  09/09/2015   215 >   212 12/12/2015 > 03/15/2016  212 > 05/25/2016   191 > 09/13/2016  182 > 06/24/2017   173       HEENT: nl dentition, turbinates bilaterally, and oropharynx. Nl external ear canals without  cough reflex - moderate bilateral non-specific turbinate edema with R > L  mp secretions     NECK :  without JVD/Nodes/TM/ nl carotid upstrokes bilaterally   LUNGS: no acc muscle use,  Nl contour chest with bilaterally sym junky sounding insp and exp rhonchi and exp cough    CV:  RRR  no s3 or murmur or increase in P2, and no edema   ABD:  soft and nontender with nl inspiratory excursion in the supine position. No bruits or organomegaly appreciated, bowel sounds nl  MS:  Nl gait/ ext warm without deformities, calf tenderness, cyanosis or clubbing No obvious joint restrictions   SKIN: warm and dry without lesions    NEURO:  alert, approp, nl sensorium with  no motor or cerebellar deficits apparent.              Assessment:

## 2017-06-25 ENCOUNTER — Encounter: Payer: Self-pay | Admitting: Internal Medicine

## 2017-06-25 ENCOUNTER — Telehealth: Payer: Self-pay | Admitting: Internal Medicine

## 2017-06-25 NOTE — Telephone Encounter (Signed)
Patient called and I advised him that work note is up front.  He will be by the office this afternoon or in the am to pick up.  No call back is needed.

## 2017-06-25 NOTE — Assessment & Plan Note (Signed)
-   Augmentin x 20 days to be complete 01/12/13  -  Sinus CT 05/05/13 > Complete opacification of visualized portions of the frontal sinuses and ethmoid sinus air cells. No obvious intraorbital or intracranial extension. Moderate mucosal thickening/partial opacification sphenoid sinus air cells (left sphenoid sinus air cell is dominant). Polypoid moderate mucosal thickening maxillary sinuses. - rec ENT eval 05/06/2013 > surgery [name]) August 04 2013 > improved - Singulair trial 03/26/2014 >>>  - Allergy profile 03/26/2014 > IgE 77 with neg RAST - referred back to [facility] for epistaxis 05/25/2016   Continue flonase/ singulair/ f/u [name] prn

## 2017-06-25 NOTE — Assessment & Plan Note (Addendum)
- PFT's 01/02/2013 wnl including fef 25-75 p am dulera 100 x 2 pffs - Med calendar 07/09/2013 > not using 12/30/2013 > not using action plans approp 01/21/2014 , 02/05/2014 , 06/22/2014  Or 05/25/2016  - 09/13/2016 p extensive coaching HFA effectiveness =    90% from baseline 75% - FENO 09/13/2016  =   29 on symb 1602 bid but has aodm so try symb 80 2bid  - 06/24/2017  After extensive coaching inhaler device  effectiveness =    75%   DDX of  difficult airways management almost all start with A and  include Adherence, Ace Inhibitors, Acid Reflux, Active Sinus Disease, Alpha 1 Antitripsin deficiency, Anxiety masquerading as Airways dz,  ABPA,  Allergy(esp in young), Aspiration (esp in elderly), Adverse effects of meds,  Active smokers, A bunch of PE's (a small clot burden can't cause this syndrome unless there is already severe underlying pulm or vascular dz with poor reserve) plus two Bs  = Bronchiectasis and Beta blocker use..and one C= CHF    Most likely here: In this case Adherence is the biggest issue and starts with  inability to use HFA effectively and also  understand that SABA treats the symptoms but doesn't get to the underlying problem (inflammation).  I used  the analogy of putting steroid cream on a rash to help explain the meaning of topical therapy and the need to get the drug to the target tissue.   - see hfa teaching - return with all meds in hand using a trust but verify approach to confirm accurate Medication  Reconciliation The principal here is that until we are certain that the  patients are doing what we've asked, it makes no sense to ask them to do more.  -  To keep things simple, I have asked the patient to first separate medicines that are perceived as maintenance, that is to be taken daily "no matter what", from those medicines that are taken on only on an as-needed basis and I have given the patient examples of both, and then return to see our NP to generate a  New detailed  medication  calendar which should be followed until the next physician sees the patient and updates it.    ? Acute sinusitis > Augmentin 875 mg take one pill twice daily  X 10 days - take at breakfast and supper with large glass of water.  It would help reduce the usual side effects (diarrhea and yeast infections) if you ate cultured yogurt at lunch. / sinus ct if not better  ? Allergy > Prednisone 10 mg take  4 each am x 2 days,   2 each am x 2 days,  1 each am x 2 days and stop    ? Anxiety/ depression/ cognitive dysfunction > usually at the bottom of this list of usual suspects but should be much higher on this pt's based on H and P with unusual affect and almost a belle indifference in reporting symptoms and managing what could be a very serious chronic illness with better insight expected at this point   I had an extended discussion with the patient reviewing all relevant studies completed to date and  lasting 15 to 20 minutes of a 25 minute acute  visit    Each maintenance medication was reviewed in detail including most importantly the difference between maintenance and prns and under what circumstances the prns are to be triggered using an action plan format that is not reflected  in the computer generated alphabetically organized AVS.    Please see AVS for specific instructions unique to this visit that I personally wrote and verbalized to the the pt in detail and then reviewed with pt  by my nurse highlighting any  changes in therapy recommended at today's visit to their plan of care.  [name]

## 2017-06-25 NOTE — Telephone Encounter (Signed)
ATC pt, no answer. Left message for pt to call back.  Letter in printed and will place up front.

## 2017-07-10 ENCOUNTER — Other Ambulatory Visit: Payer: Self-pay | Admitting: Internal Medicine

## 2017-07-16 ENCOUNTER — Telehealth: Payer: Self-pay | Admitting: Family Medicine

## 2017-07-16 NOTE — Telephone Encounter (Signed)
Spoke to patient and informed him that we have a coupon that he can try. Patient will pick up tomorrow.

## 2017-07-16 NOTE — Telephone Encounter (Signed)
Copied from [place]. Topic: Quick Communication - See Telephone Encounter >> Jul 16, 2017  4:42 PM Arletha Grippe wrote: CRM for notification. See Telephone encounter for:   07/16/17. Pt needs to get a coupon card for TRULICITY 1.5 VB/1.6OM SOPN - it costs 500.00 for pt  Please call pt to let him know [phone]

## 2017-07-16 NOTE — Telephone Encounter (Signed)
Spoke with patient and informed him that I would see if we had any coupons will call patient back.

## 2017-07-20 ENCOUNTER — Other Ambulatory Visit: Payer: Self-pay | Admitting: Internal Medicine

## 2017-07-22 ENCOUNTER — Other Ambulatory Visit: Payer: Self-pay | Admitting: Internal Medicine

## 2017-07-30 ENCOUNTER — Other Ambulatory Visit: Payer: Self-pay | Admitting: Internal Medicine

## 2017-08-03 ENCOUNTER — Other Ambulatory Visit: Payer: Self-pay | Admitting: Internal Medicine

## 2017-08-26 ENCOUNTER — Other Ambulatory Visit: Payer: Self-pay | Admitting: Internal Medicine

## 2017-09-03 ENCOUNTER — Telehealth: Payer: Self-pay | Admitting: Internal Medicine

## 2017-09-03 NOTE — Telephone Encounter (Signed)
Copied from [place] [id number]. Topic: Quick Communication - Rx Refill/Question >> Sep 03, 2017 11:02 AM Scherrie Gerlach wrote: Medication: TRULICITY 1.5 PJ/0.9TO SOPN Pt states this med is over $450 a month and wants to know if something comparable that he could switch to?   [facility], [address] AT [address] [phone] (Phone) [phone] (Fax)

## 2017-09-05 NOTE — Telephone Encounter (Signed)
Left message to return call 

## 2017-09-06 NOTE — Telephone Encounter (Signed)
Left message to return call 

## 2017-09-09 NOTE — Telephone Encounter (Signed)
Asked patient to check with his insurance to  to determine coverage for medicines in this class such as Ozempic by Bydurion, Byetta and others

## 2017-09-10 NOTE — Telephone Encounter (Signed)
Left message to return call. Patient hasn't been answering the phone. I will send a letter out.

## 2017-09-10 NOTE — Telephone Encounter (Signed)
Mother calling back and checking status, [phone]

## 2017-09-10 NOTE — Telephone Encounter (Signed)
Spoke to patient's mother and informed her she should call the insurance company to see which one is covered and call us back with the rx so that it can be called in.

## 2017-09-13 ENCOUNTER — Telehealth: Payer: Self-pay | Admitting: Internal Medicine

## 2017-09-13 NOTE — Telephone Encounter (Signed)
Copied from [place] [phone]. Topic: Quick Communication - See Telephone Encounter >> Sep 13, 2017  4:02 PM Arletha Grippe wrote: CRM for notification. See Telephone encounter for: 09/13/17. Pt is looking for a cheaper alternative to trulicity [phone]

## 2017-09-17 NOTE — Telephone Encounter (Signed)
Spoke to pt mother regarding this matter. She was suppose to find the cheaper alternative. I informed her to call  her son's insurance company and call us back with the answer so that we could send a new Rx.

## 2017-09-18 NOTE — Telephone Encounter (Signed)
Left message to return call 

## 2017-09-25 ENCOUNTER — Ambulatory Visit (INDEPENDENT_AMBULATORY_CARE_PROVIDER_SITE_OTHER): Payer: PRIVATE HEALTH INSURANCE | Admitting: Adult Health

## 2017-09-25 ENCOUNTER — Encounter: Payer: Self-pay | Admitting: Adult Health

## 2017-09-25 DIAGNOSIS — J310 Chronic rhinitis: Secondary | ICD-10-CM

## 2017-09-25 DIAGNOSIS — J45909 Unspecified asthma, uncomplicated: Secondary | ICD-10-CM

## 2017-09-25 NOTE — Progress Notes (Signed)
@Patient  ID: Mark Ward, male    DOB: 1966-01-25, 52 y.o.   MRN: [id number]  Chief Complaint  Patient presents with  . Follow-up    Asthma     Referring provider: Marletta Lor, MD  HPI: 52 year old male never smoker followed for asthma  TEST  PFT's 01/02/2013 wnl including fef 25-75 p am dulera 100 x 2 pffs FENO 09/13/2016  =   29   09/25/2017 Follow up : Asthma  Patient presents for a 3-month follow-up.  Patient has underlying asthma.  Remains on Symbicort twice daily.  Says overall breathing has been doing well.  No flare of cough or wheezing.  No increased albuterol use.  Has some chronic rhinitis.  Uses Flonase and Singulair.  We reviewed all his medications organize them into a medication count with patient education.  Patient says that his diabetic medicines are expensive.  He was given a good Rx coupon card to see if he can find more affordable options.     No Known Allergies  Immunization History  Administered Date(s) Administered  . Influenza,inj,Quad PF,6+ Mos 01/02/2013, 12/30/2013, 03/18/2015, 03/15/2016, 04/16/2017  . Tdap 12/25/2012    Past Medical History:  Diagnosis Date  . Bronchitis   . Chronic sinusitis     Tobacco History: Social History   Tobacco Use  Smoking Status Never Smoker  Smokeless Tobacco Never Used   Counseling given: Not Answered   Outpatient Encounter Medications as of 09/25/2017  Medication Sig  . acetaminophen (TYLENOL) 500 MG tablet Take 500 mg by mouth every 6 (six) hours as needed.  [name] albuterol (PROAIR HFA) 108 (90 Base) MCG/ACT inhaler 2 puffs every 4 hours if needed  . Aloe-Sodium Chloride (AYR SALINE NASAL GEL NA) As needed  . atorvastatin (LIPITOR) 10 MG tablet Take 1 tablet (10 mg total) by mouth daily.  . B Complex-Biotin-FA (VITAMIN B50 COMPLEX PO) Take 1 tablet by mouth daily.  . budesonide-formoterol (SYMBICORT) 80-4.5 MCG/ACT inhaler Inhale 2 puffs into the lungs 2 (two) times daily.  .  cholecalciferol (VITAMIN D) 400 units TABS tablet Take 5,000 Units by mouth daily.   . Chromium Picolinate (CHROMIUM PICOLATE) 1000 MCG TABS Take 1,000 mg by mouth daily.  . fluticasone (FLONASE) 50 MCG/ACT nasal spray Place 1 spray into both nostrils 2 (two) times daily as needed for allergies or rhinitis.  [name] glucose blood (ONETOUCH VERIO) test strip Use as instructed  . guaifenesin (ROBITUSSIN) 100 MG/5ML syrup Take 200 mg by mouth 3 (three) times daily as needed for cough.  . Insulin Pen Needle 31G X 5 MM MISC Pt is to check blood sugar before breakfast and before evening meal  . L-GLUTAMINE PO Take 1,000 mg by mouth daily.  . Lancets (ONETOUCH ULTRASOFT) lancets Use as instructed  . Magnesium Oxide (MAG-CAPS PO) Take by mouth daily.  . metFORMIN (GLUCOPHAGE) 1000 MG tablet TAKE 1 TABLET (1,000 MG TOTAL) BY MOUTH 2 (TWO) TIMES DAILY WITH A MEAL.  . montelukast (SINGULAIR) 10 MG tablet TAKE 1 TABLET BY MOUTH EVERYDAY AT BEDTIME  . Multiple Vitamins-Minerals (CENTRUM) tablet Take 1 tablet by mouth daily.  [name] oxymetazoline (AFRIN) 0.05 % nasal spray 2 puffs twice daily x 5 days, off for 2 wks.  . predniSONE (DELTASONE) 10 MG tablet Take  4 each am x 2 days,   2 each am x 2 days,  1 each am x 2 days and stop  . Probiotic Product (PROBIOTIC PO) Take 1 capsule by mouth daily.  [name] pyridOXINE (  VITAMIN B-6) 100 MG tablet Take 100 mg by mouth daily.  . Saline GEL as needed.  . Selenium 100 MCG CAPS Take 100 mcg by mouth daily.  . sodium chloride (OCEAN) 0.65 % SOLN nasal spray Place 1 spray into both nostrils as needed for congestion.  . TRULICITY 1.5 ZO/1.0RU SOPN INJECT 1.5 MG( 0.5 ML) UNDER THE SKIN ONCE EVERY WEEK  . vitamin C (ASCORBIC ACID) 500 MG tablet Take 500 mg by mouth daily.   No facility-administered encounter medications on file as of 09/25/2017.      Review of Systems  Constitutional:   No  weight loss, night sweats,  Fevers, chills, fatigue, or  lassitude.  HEENT:   No headaches,   Difficulty swallowing,  Tooth/dental problems, or  Sore throat,                No sneezing, itching, ear ache,  +nasal congestion, post nasal drip,   CV:  No chest pain,  Orthopnea, PND, swelling in lower extremities, anasarca, dizziness, palpitations, syncope.   GI  No heartburn, indigestion, abdominal pain, nausea, vomiting, diarrhea, change in bowel habits, loss of appetite, bloody stools.   Resp: No shortness of breath with exertion or at rest.  No excess mucus, no productive cough,  No non-productive cough,  No coughing up of blood.  No change in color of mucus.  No wheezing.  No chest wall deformity  Skin: no rash or lesions.  GU: no dysuria, change in color of urine, no urgency or frequency.  No flank pain, no hematuria   MS:  No joint pain or swelling.  No decreased range of motion.  No back pain.    Physical Exam  BP 132/88 (BP Location: Left Arm, Cuff Size: Normal)   Pulse (!) 103   Ht 5\' 7"  (1.702 m)   Wt 170 lb 9.6 oz (77.4 kg)   SpO2 99%   BMI 26.72 kg/m   GEN: A/Ox3; pleasant , NAD, well nourished    HEENT:  [place]/AT,  EACs-clear, TMs-wnl, NOSE-clear drainage, tHROAT-clear, no lesions, no postnasal drip or exudate noted.   NECK:  Supple w/ fair ROM; no JVD; normal carotid impulses w/o bruits; no thyromegaly or nodules palpated; no lymphadenopathy.    RESP  Clear  P & A; w/o, wheezes/ rales/ or rhonchi. no accessory muscle use, no dullness to percussion  CARD:  RRR, no m/r/g, no peripheral edema, pulses intact, no cyanosis or clubbing.  GI:   Soft & nt; nml bowel sounds; no organomegaly or masses detected.   Musco: Warm bil, no deformities or joint swelling noted.   Neuro: alert, no focal deficits noted.    Skin: Warm, no lesions or rashes    Lab Results:  CBC  BNP No results found for: BNP  ProBNP No results found for: PROBNP  Imaging: No results found.   Assessment & Plan:   Intrinsic asthma Well-controlled on current regimen Patient's  medications were reviewed today and patient education was given. Computerized medication calendar was adjusted/completed    Plan . Patient Instructions  Continue on current regimen .  Follow medication calendar closely and bring to each visit .  Follow up with Dr. Melvyn Novas  In 3-4 months and As needed   Check [facility] for prescription discounts .      Chronic rhinitis Controlled on current regimen  Plan  Patient Instructions  Continue on current regimen .  Follow medication calendar closely and bring to each visit .  Follow  up with Dr. Melvyn Novas  In 3-4 months and As needed   Check [facility] for prescription discounts .         Rexene Edison, NP 09/25/2017

## 2017-09-25 NOTE — Assessment & Plan Note (Addendum)
Well-controlled on current regimen Patient's medications were reviewed today and patient education was given. Computerized medication calendar was adjusted/completed    Plan . Patient Instructions  Continue on current regimen .  Follow medication calendar closely and bring to each visit .  Follow up with Dr. Melvyn Novas  In 3-4 months and As needed   Check [facility] for prescription discounts .

## 2017-09-25 NOTE — Assessment & Plan Note (Signed)
Controlled on current regimen  Plan  Patient Instructions  Continue on current regimen .  Follow medication calendar closely and bring to each visit .  Follow up with Dr. Melvyn Novas  In 3-4 months and As needed   Check [facility] for prescription discounts .

## 2017-09-25 NOTE — Patient Instructions (Signed)
Continue on current regimen .  Follow medication calendar closely and bring to each visit .  Follow up with Dr. Melvyn Novas  In 3-4 months and As needed   Check [facility] for prescription discounts .

## 2017-09-26 NOTE — Progress Notes (Signed)
Chart and office note reviewed in detail  > agree with a/p as outlined    

## 2017-10-02 NOTE — Addendum Note (Signed)
Addended by: Della Goo C on: 10/02/2017 04:27 PM   Modules accepted: Orders

## 2017-10-15 ENCOUNTER — Encounter: Payer: Self-pay | Admitting: Internal Medicine

## 2017-10-15 ENCOUNTER — Ambulatory Visit (INDEPENDENT_AMBULATORY_CARE_PROVIDER_SITE_OTHER): Payer: PRIVATE HEALTH INSURANCE | Admitting: Internal Medicine

## 2017-10-15 VITALS — BP 110/70 | Temp 98.7°F | Ht 67.75 in | Wt 168.0 lb

## 2017-10-15 DIAGNOSIS — Z0000 Encounter for general adult medical examination without abnormal findings: Secondary | ICD-10-CM

## 2017-10-15 MED ORDER — METFORMIN HCL 1000 MG PO TABS: 1000.0000 mg | ORAL_TABLET | Freq: Two times a day (BID) | ORAL | 4 refills | Status: DC

## 2017-10-15 MED ORDER — ATORVASTATIN CALCIUM 10 MG PO TABS: 10.0000 mg | ORAL_TABLET | Freq: Every day | ORAL | 3 refills | Status: DC

## 2017-10-15 MED ORDER — DULAGLUTIDE 1.5 MG/0.5ML ~~LOC~~ SOAJ: SUBCUTANEOUS | 4 refills | Status: DC

## 2017-10-15 NOTE — Patient Instructions (Signed)
Schedule your colonoscopy to help detect colon cancer.  Please see your eye doctor yearly to check for diabetic eye damage   Please check your hemoglobin A1c every 3-6  Months    It is important that you exercise regularly, at least 20 minutes 3 to 4 times per week.  If you develop chest pain or shortness of breath seek  medical attention.

## 2017-10-15 NOTE — Progress Notes (Signed)
Subjective:    Patient ID: Mark Ward, male    DOB: 04-Nov-1965, 52 y.o.   MRN: [id number]  HPI Lab Results  Component Value Date   HGBA1C 5.3 04/16/2017   52 year old patient who is seen today for a preventive health examination  He does quite well.  He has a history of type 2 diabetes which has been well controlled.  He has chronic rhinitis and asthma.  He has been stable.  He has been followed by pulmonary medicine and ENT.  Family history mother age 70 in good health.  Father died about 1 year ago at age 63 with history of diabetes dementia and Parkinson's disease.  One brother is well   He is a lifelong nonsmoker. His past medical history is otherwise unremarkable no other prior hospital admissions except for 2 admissions for asthma exacerbations in 2014 in 2015. He has had wisdom tooth extractions but otherwise no surgery  Social history born in [place], graduated [employer] in 1987 a graduate of [employer] in 1994 \ Review of Systems  Constitutional: Negative for appetite change, chills, fatigue and fever.  HENT: Negative for congestion, dental problem, ear pain, hearing loss, sore throat, tinnitus, trouble swallowing and voice change.   Eyes: Negative for pain, discharge and visual disturbance.  Respiratory: Negative for cough, chest tightness, wheezing and stridor.   Cardiovascular: Negative for chest pain, palpitations and leg swelling.  Gastrointestinal: Negative for abdominal distention, abdominal pain, blood in stool, constipation, diarrhea, nausea and vomiting.  Genitourinary: Negative for difficulty urinating, discharge, flank pain, genital sores, hematuria and urgency.  Musculoskeletal: Negative for arthralgias, back pain, gait problem, joint swelling, myalgias and neck stiffness.  Skin: Negative for rash.  Neurological: Negative for dizziness, syncope, speech difficulty, weakness, numbness and headaches.  Hematological: Negative for adenopathy. Does not  bruise/bleed easily.  Psychiatric/Behavioral: Negative for behavioral problems and dysphoric mood. The patient is not nervous/anxious.        Objective:   Physical Exam  Constitutional: He appears well-developed and well-nourished.  HENT:  Head: Normocephalic and atraumatic.  Right Ear: External ear normal.  Left Ear: External ear normal.  Nose: Nose normal.  Mouth/Throat: Oropharynx is clear and moist.  Wax in both canals  Eyes: Pupils are equal, round, and reactive to light. Conjunctivae and EOM are normal. No scleral icterus.  Neck: Normal range of motion. Neck supple. No JVD present. No thyromegaly present.  Cardiovascular: Regular rhythm, normal heart sounds and intact distal pulses. Exam reveals no gallop and no friction rub.  No murmur heard. Pulmonary/Chest: Effort normal and breath sounds normal. He exhibits no tenderness.  Abdominal: Soft. Bowel sounds are normal. He exhibits no distension and no mass. There is no tenderness.  Genitourinary: Prostate normal and penis normal. Rectal exam shows guaiac negative stool.  Musculoskeletal: Normal range of motion. He exhibits no edema or tenderness.  Flatfeet  Lymphadenopathy:    He has no cervical adenopathy.  Neurological: He is alert. He has normal reflexes. No cranial nerve deficit. Coordination normal.  Skin: Skin is warm and dry. No rash noted.  Psychiatric: He has a normal mood and affect. His behavior is normal.          Assessment & Plan:   Preventive health examination Diabetes mellitus.  Will check hemoglobin A1c urine for microalbumin and lipid profile Asthma.  Stable no change in regimen  Annual eye examination encouraged We will set up screening colonoscopy Follow-up 6 months with new provider Medications updated  Marletta Lor

## 2017-10-21 ENCOUNTER — Encounter: Payer: Self-pay | Admitting: Gastroenterology

## 2017-10-21 ENCOUNTER — Telehealth: Payer: Self-pay | Admitting: *Deleted

## 2017-10-21 NOTE — Telephone Encounter (Signed)
I see where there was a GI referral placed 10/15/17 Nothing mentioned about Endo unless the patient is talking about an Endoscopy referral.   Was there to be a referral placed to Endo as well?  Copied from [place] [phone]. Topic: Referral - Status >> Oct 21, 2017 10:33 AM Scherrie Gerlach wrote: Reason for CRM: pt states Dr Raliegh Ip was going to refer him to Endo, but I do not see that request. Please advise

## 2017-10-22 ENCOUNTER — Other Ambulatory Visit: Payer: Self-pay | Admitting: Internal Medicine

## 2017-10-22 DIAGNOSIS — Z0000 Encounter for general adult medical examination without abnormal findings: Secondary | ICD-10-CM

## 2017-10-22 NOTE — Telephone Encounter (Signed)
Please advise Pt also never stopped by lab for blood work.

## 2017-10-22 NOTE — Telephone Encounter (Signed)
Order placed for initial screening colonoscopy

## 2017-10-24 NOTE — Telephone Encounter (Signed)
Patient notified of referral and schedule procedure date pt verbalized understanding.

## 2017-10-25 ENCOUNTER — Other Ambulatory Visit: Payer: Self-pay | Admitting: Internal Medicine

## 2017-10-30 ENCOUNTER — Telehealth: Payer: Self-pay | Admitting: *Deleted

## 2017-10-30 NOTE — Telephone Encounter (Signed)
Copied from [place] [phone]. Topic: Referral - Request >> Oct 30, 2017  3:53 PM Pricilla Handler wrote: Reason for CRM: Patient's mother Mark Ward states that Dr. Raliegh Ip was to send a referral for a endocrinologist. Please call Mark Ward with this update information?

## 2017-10-30 NOTE — Telephone Encounter (Signed)
Copied from [place] [id number]. Topic: Inquiry >> Oct 30, 2017  3:45 PM Pricilla Handler wrote: Reason for CRM: Patient's mother Romie Minus called regarding patient's Dulaglutide (TRULICITY) 1.5 ZO/8.9LL SOPN. Patient's mother states that the medication is too exspensive. Romie Minus wants to know if Dr. Raliegh Ip could prescribe an alternative. Patient next dose is due Saturday. Please advise.       Thank You!!!

## 2017-11-04 NOTE — Telephone Encounter (Signed)
Would you like for me to send in Haakon and schedule pt for OV for administration teaching?  Please advise

## 2017-11-04 NOTE — Telephone Encounter (Signed)
Office visit this week please

## 2017-11-05 NOTE — Telephone Encounter (Signed)
Spoke to pt mother Romie Minus and informed her that the pt needs to come in this week if possible for alteranative. Romie Minus stated that the pt works 7-4 most days unless they get out sooner.

## 2017-11-12 ENCOUNTER — Encounter: Payer: Self-pay | Admitting: Internal Medicine

## 2017-11-12 ENCOUNTER — Ambulatory Visit (INDEPENDENT_AMBULATORY_CARE_PROVIDER_SITE_OTHER): Payer: PRIVATE HEALTH INSURANCE | Admitting: Internal Medicine

## 2017-11-12 VITALS — BP 100/60 | HR 91 | Temp 98.5°F | Wt 168.8 lb

## 2017-11-12 DIAGNOSIS — E1165 Type 2 diabetes mellitus with hyperglycemia: Secondary | ICD-10-CM

## 2017-11-12 LAB — POCT GLYCOSYLATED HEMOGLOBIN (HGB A1C): Hemoglobin A1C: 5.1 % (ref 4.0–5.6)

## 2017-11-12 NOTE — Progress Notes (Signed)
Subjective:    Patient ID: Mark Ward, male    DOB: 12-09-1965, 52 y.o.   MRN: [id number]  HPI  52 year old patient who has an approximate year and a half history of diabetes. He has had excellent glycemic control on metformin as well as Trulicity.  He presently has poor insurance coverage for Trulicity and is here to discuss other options  Lab Results  Component Value Date   HGBA1C 5.1 11/12/2017  He feels quite well He has lost 40 pounds since the initial diagnosis of diabetes.  He does monitor home blood sugars with normal results  Past Medical History:  Diagnosis Date  . Bronchitis   . Chronic sinusitis      Social History   Socioeconomic History  . Marital status: Single    Spouse name: Not on file  . Number of children: Not on file  . Years of education: Not on file  . Highest education level: Not on file  Occupational History  . Occupation: Research scientist (physical sciences): [employer]  . Financial resource strain: Not on file  . Food insecurity:    Worry: Not on file    Inability: Not on file  . Transportation needs:    Medical: Not on file    Non-medical: Not on file  Tobacco Use  . Smoking status: Never Smoker  . Smokeless tobacco: Never Used  Substance and Sexual Activity  . Alcohol use: No  . Drug use: No  . Sexual activity: Not on file  Lifestyle  . Physical activity:    Days per week: Not on file    Minutes per session: Not on file  . Stress: Not on file  Relationships  . Social connections:    Talks on phone: Not on file    Gets together: Not on file    Attends religious service: Not on file    Active member of club or organization: Not on file    Attends meetings of clubs or organizations: Not on file    Relationship status: Not on file  . Intimate partner violence:    Fear of current or ex partner: Not on file    Emotionally abused: Not on file    Physically abused: Not on file    Forced sexual activity: Not on file  Other  Topics Concern  . Not on file  Social History Narrative  . Not on file    Past Surgical History:  Procedure Laterality Date  . MOUTH SURGERY      History reviewed. No pertinent family history.  No Known Allergies  Current Outpatient Medications on File Prior to Visit  Medication Sig Dispense Refill  . acetaminophen (TYLENOL) 500 MG tablet Take 500 mg by mouth every 6 (six) hours as needed.    [name] albuterol (PROAIR HFA) 108 (90 Base) MCG/ACT inhaler 2 puffs every 4 hours if needed 1 Inhaler 1  . Aloe-Sodium Chloride (AYR SALINE NASAL GEL NA) As needed    . atorvastatin (LIPITOR) 10 MG tablet Take 1 tablet (10 mg total) by mouth daily. 90 tablet 3  . budesonide-formoterol (SYMBICORT) 80-4.5 MCG/ACT inhaler Inhale 2 puffs into the lungs 2 (two) times daily. 1 Inhaler 11  . cholecalciferol (VITAMIN D) 400 units TABS tablet Take 2,000 Units by mouth daily.     . fluticasone (FLONASE) 50 MCG/ACT nasal spray Place 1 spray into both nostrils 2 (two) times daily as needed for allergies or rhinitis.    [name] glucose  blood (ONETOUCH VERIO) test strip Use as instructed 100 each 12  . guaifenesin (ROBITUSSIN) 100 MG/5ML syrup Take 200 mg by mouth 3 (three) times daily as needed for cough.    . Insulin Pen Needle 31G X 5 MM MISC Pt is to check blood sugar before breakfast and before evening meal 100 each 3  . Lancets (ONETOUCH ULTRASOFT) lancets Use as instructed 100 each 12  . Magnesium Oxide (MAG-CAPS PO) Take by mouth daily.    . metFORMIN (GLUCOPHAGE) 1000 MG tablet Take 1 tablet (1,000 mg total) by mouth 2 (two) times daily with a meal. 180 tablet 4  . montelukast (SINGULAIR) 10 MG tablet TAKE 1 TABLET BY MOUTH EVERYDAY AT BEDTIME 30 tablet 11  . Multiple Vitamins-Minerals (CENTRUM) tablet Take 1 tablet by mouth daily.    [name] oxymetazoline (AFRIN) 0.05 % nasal spray 2 puffs twice daily x 5 days, off for 2 wks.    . Probiotic Product (PROBIOTIC PO) Take 1 capsule by mouth daily.    [name] pyridOXINE  (VITAMIN B-6) 100 MG tablet Take 100 mg by mouth daily.    . Saline GEL as needed.    . sodium chloride (OCEAN) 0.65 % SOLN nasal spray Place 1 spray into both nostrils as needed for congestion.    . Turmeric Curcumin 500 MG CAPS Take 500 mg by mouth 3 (three) times a week.    . vitamin C (ASCORBIC ACID) 500 MG tablet Take 500 mg by mouth daily.     No current facility-administered medications on file prior to visit.     BP 100/60 (BP Location: Right Arm, Patient Position: Sitting, Cuff Size: Large)   Pulse 91   Temp 98.5 F (36.9 C) (Oral)   Wt 168 lb 12.8 oz (76.6 kg)   SpO2 98%   BMI 25.86 kg/m     Review of Systems  Constitutional: Negative for appetite change, chills, fatigue and fever.  HENT: Negative for congestion, dental problem, ear pain, hearing loss, sore throat, tinnitus, trouble swallowing and voice change.   Eyes: Negative for pain, discharge and visual disturbance.  Respiratory: Negative for cough, chest tightness, wheezing and stridor.   Cardiovascular: Negative for chest pain, palpitations and leg swelling.  Gastrointestinal: Negative for abdominal distention, abdominal pain, blood in stool, constipation, diarrhea, nausea and vomiting.  Genitourinary: Negative for difficulty urinating, discharge, flank pain, genital sores, hematuria and urgency.  Musculoskeletal: Negative for arthralgias, back pain, gait problem, joint swelling, myalgias and neck stiffness.  Skin: Negative for rash.  Neurological: Negative for dizziness, syncope, speech difficulty, weakness, numbness and headaches.  Hematological: Negative for adenopathy. Does not bruise/bleed easily.  Psychiatric/Behavioral: Negative for behavioral problems and dysphoric mood. The patient is not nervous/anxious.        Objective:   Physical Exam  Constitutional: He appears well-developed and well-nourished. No distress.  Blood pressure low normal  Pulmonary/Chest: Effort normal and breath sounds normal.           Assessment & Plan:  Diabetes mellitus.  Tight control on dual therapy Patient has lost over 40 pounds since his initial diagnosis of uncontrolled diabetes.  Hemoglobin A1c is 5.1.  Have elected to maintain metformin only and follow-up hemoglobin A1c in 3 months.  Patient will continue home blood sugar monitoring.  He will call the office if blood sugars are consistently above 150;  otherwise will establish with a new provider in 3 months  Marletta Lor

## 2017-11-12 NOTE — Patient Instructions (Addendum)
Please check your hemoglobin A1c every 3 months  Return in 3 months for follow-up  Continue to monitor home blood sugar readings.  If blood sugars are consistently greater than 150 please notify the office.  Otherwise follow-up in 3 months  Discontinue Trulicity  Continue metformin

## 2017-11-13 LAB — CBC WITH DIFFERENTIAL/PLATELET
BASOS ABS: 0 10*3/uL (ref 0.0–0.1)
Basophils Relative: 0.3 % (ref 0.0–3.0)
EOS PCT: 4 % (ref 0.0–5.0)
Eosinophils Absolute: 0.2 10*3/uL (ref 0.0–0.7)
HEMATOCRIT: 42.6 % (ref 39.0–52.0)
Hemoglobin: 14.7 g/dL (ref 13.0–17.0)
LYMPHS ABS: 2 10*3/uL (ref 0.7–4.0)
LYMPHS PCT: 43.6 % (ref 12.0–46.0)
MCHC: 34.4 g/dL (ref 30.0–36.0)
MCV: 88.1 fl (ref 78.0–100.0)
MONOS PCT: 11.1 % (ref 3.0–12.0)
Monocytes Absolute: 0.5 10*3/uL (ref 0.1–1.0)
NEUTROS ABS: 1.9 10*3/uL (ref 1.4–7.7)
NEUTROS PCT: 41 % — AB (ref 43.0–77.0)
PLATELETS: 218 10*3/uL (ref 150.0–400.0)
RBC: 4.84 Mil/uL (ref 4.22–5.81)
RDW: 13.3 % (ref 11.5–15.5)
WBC: 4.7 10*3/uL (ref 4.0–10.5)

## 2017-11-13 LAB — COMPREHENSIVE METABOLIC PANEL
ALBUMIN: 4.4 g/dL (ref 3.5–5.2)
ALT: 28 U/L (ref 0–53)
AST: 24 U/L (ref 0–37)
Alkaline Phosphatase: 52 U/L (ref 39–117)
BUN: 14 mg/dL (ref 6–23)
CHLORIDE: 100 meq/L (ref 96–112)
CO2: 31 mEq/L (ref 19–32)
CREATININE: 0.87 mg/dL (ref 0.40–1.50)
Calcium: 9.6 mg/dL (ref 8.4–10.5)
GFR: 119.37 mL/min (ref >60.00)
Glucose, Bld: 85 mg/dL (ref 70–99)
Potassium: 3.9 mEq/L (ref 3.5–5.1)
SODIUM: 139 meq/L (ref 135–145)
Total Bilirubin: 0.7 mg/dL (ref 0.2–1.2)
Total Protein: 7.2 g/dL (ref 6.0–8.3)

## 2017-11-13 LAB — LIPID PANEL
CHOLESTEROL: 111 mg/dL (ref 0–200)
HDL: 48.3 mg/dL (ref >39.00)
LDL CALC: 51 mg/dL (ref 0–99)
NonHDL: 62.57
Total CHOL/HDL Ratio: 2
Triglycerides: 56 mg/dL (ref 0.0–149.0)
VLDL: 11.2 mg/dL (ref 0.0–40.0)

## 2017-11-13 LAB — MICROALBUMIN / CREATININE URINE RATIO
CREATININE, U: 94.6 mg/dL
Microalb Creat Ratio: 0.9 mg/g (ref 0.0–30.0)
Microalb, Ur: 0.9 mg/dL (ref 0.0–1.9)

## 2017-11-13 LAB — TSH: TSH: 0.98 u[IU]/mL (ref 0.35–4.50)

## 2017-12-23 ENCOUNTER — Ambulatory Visit (AMBULATORY_SURGERY_CENTER): Payer: Self-pay

## 2017-12-23 VITALS — Ht 67.0 in | Wt 178.2 lb

## 2017-12-23 DIAGNOSIS — Z1211 Encounter for screening for malignant neoplasm of colon: Secondary | ICD-10-CM

## 2017-12-23 NOTE — Progress Notes (Signed)
Denies allergies to eggs or soy products. Denies complication of anesthesia or sedation. Denies use of weight loss medication. Denies use of O2.   Emmi instructions declined.  

## 2018-01-06 ENCOUNTER — Encounter: Payer: Self-pay | Admitting: Gastroenterology

## 2018-01-06 ENCOUNTER — Ambulatory Visit (AMBULATORY_SURGERY_CENTER): Payer: PRIVATE HEALTH INSURANCE | Admitting: Gastroenterology

## 2018-01-06 VITALS — BP 108/67 | HR 81 | Temp 97.8°F | Resp 16 | Ht 67.0 in | Wt 178.0 lb

## 2018-01-06 DIAGNOSIS — Z1211 Encounter for screening for malignant neoplasm of colon: Secondary | ICD-10-CM

## 2018-01-06 DIAGNOSIS — D124 Benign neoplasm of descending colon: Secondary | ICD-10-CM

## 2018-01-06 MED ORDER — SODIUM CHLORIDE 0.9 % IV SOLN: 500.0000 mL | Freq: Once | INTRAVENOUS | Status: DC

## 2018-01-06 NOTE — Progress Notes (Signed)
Pt's states no medical or surgical changes since previsit or office visit. 

## 2018-01-06 NOTE — Progress Notes (Signed)
Report given to PACU, vss 

## 2018-01-06 NOTE — Progress Notes (Signed)
Called to room to assist during endoscopic procedure.  Patient ID and intended procedure confirmed with present staff. Received instructions for my participation in the procedure from the performing physician.  

## 2018-01-06 NOTE — Op Note (Signed)
[place] Patient Name: Mark Ward Procedure Date: 01/06/2018 7:56 AM MRN: [id number] Endoscopist: Mauri Pole , MD Age: 52 Referring MD:  Date of Birth: [birth year] Gender: Male Account #: [id number] Procedure:                Colonoscopy Indications:              Screening for colorectal malignant neoplasm Medicines:                Monitored Anesthesia Care Procedure:                Pre-Anesthesia Assessment:                           - Prior to the procedure, a History and Physical                            was performed, and patient medications and                            allergies were reviewed. The patient's tolerance of                            previous anesthesia was also reviewed. The risks                            and benefits of the procedure and the sedation                            options and risks were discussed with the patient.                            All questions were answered, and informed consent                            was obtained. Prior Anticoagulants: The patient has                            taken no previous anticoagulant or antiplatelet                            agents. ASA Grade Assessment: II - A patient with                            mild systemic disease. After reviewing the risks                            and benefits, the patient was deemed in                            satisfactory condition to undergo the procedure.                           After obtaining informed consent, the colonoscope  was passed under direct vision. Throughout the                            procedure, the patient's blood pressure, pulse, and                            oxygen saturations were monitored continuously. The                            Colonoscope was introduced through the anus and                            advanced to the the terminal ileum, with                            identification of the  appendiceal orifice and IC                            valve. The colonoscopy was performed without                            difficulty. The patient tolerated the procedure                            well. The quality of the bowel preparation was                            adequate. The terminal ileum, ileocecal valve,                            appendiceal orifice, and rectum were photographed. Scope In: 8:02:30 AM Scope Out: 8:24:28 AM Scope Withdrawal Time: 0 hours 14 minutes 56 seconds  Total Procedure Duration: 0 hours 21 minutes 58 seconds  Findings:                 The perianal and digital rectal examinations were                            normal.                           A 2 mm polyp was found in the descending colon. The                            polyp was sessile. The polyp was removed with a                            cold biopsy forceps. Resection and retrieval were                            complete.                           Non-bleeding internal hemorrhoids were found during  retroflexion. The hemorrhoids were medium-sized. Complications:            No immediate complications. Estimated Blood Loss:     Estimated blood loss was minimal. Impression:               - One 2 mm polyp in the descending colon, removed                            with a cold biopsy forceps. Resected and retrieved.                           - Non-bleeding internal hemorrhoids. Recommendation:           - Patient has a contact number available for                            emergencies. The signs and symptoms of potential                            delayed complications were discussed with the                            patient. Return to normal activities tomorrow.                            Written discharge instructions were provided to the                            patient.                           - Resume previous diet.                           - Continue  present medications.                           - Await pathology results.                           - Repeat colonoscopy in 5-10 years for surveillance                            based on pathology results. Mauri Pole, MD 01/06/2018 8:28:21 AM This report has been signed electronically.

## 2018-01-06 NOTE — Patient Instructions (Signed)
1 polyp and non-bleeding hemorrhoids.   YOU HAD AN ENDOSCOPIC PROCEDURE TODAY AT [facility]:   Refer to the procedure report that was given to you for any specific questions about what was found during the examination.  If the procedure report does not answer your questions, please call your gastroenterologist to clarify.  If you requested that your care partner not be given the details of your procedure findings, then the procedure report has been included in a sealed envelope for you to review at your convenience later.  YOU SHOULD EXPECT: Some feelings of bloating in the abdomen. Passage of more gas than usual.  Walking can help get rid of the air that was put into your GI tract during the procedure and reduce the bloating. If you had a lower endoscopy (such as a colonoscopy or flexible sigmoidoscopy) you may notice spotting of blood in your stool or on the toilet paper. If you underwent a bowel prep for your procedure, you may not have a normal bowel movement for a few days.  Please Note:  You might notice some irritation and congestion in your nose or some drainage.  This is from the oxygen used during your procedure.  There is no need for concern and it should clear up in a day or so.  SYMPTOMS TO REPORT IMMEDIATELY:   Following lower endoscopy (colonoscopy or flexible sigmoidoscopy):  Excessive amounts of blood in the stool  Significant tenderness or worsening of abdominal pains  Swelling of the abdomen that is new, acute  Fever of 100F or higher   For urgent or emergent issues, a gastroenterologist can be reached at any hour by calling [phone].   DIET:  We do recommend a small meal at first, but then you may proceed to your regular diet.  Drink plenty of fluids but you should avoid alcoholic beverages for 24 hours.  ACTIVITY:  You should plan to take it easy for the rest of today and you should NOT DRIVE or use heavy machinery until tomorrow (because of the  sedation medicines used during the test).    FOLLOW UP: Our staff will call the number listed on your records the next business day following your procedure to check on you and address any questions or concerns that you may have regarding the information given to you following your procedure. If we do not reach you, we will leave a message.  However, if you are feeling well and you are not experiencing any problems, there is no need to return our call.  We will assume that you have returned to your regular daily activities without incident.  If any biopsies were taken you will be contacted by phone or by letter within the next 1-3 weeks.  Please call us at [phone] if you have not heard about the biopsies in 3 weeks.    SIGNATURES/CONFIDENTIALITY: You and/or your care partner have signed paperwork which will be entered into your electronic medical record.  These signatures attest to the fact that that the information above on your After Visit Summary has been reviewed and is understood.  Full responsibility of the confidentiality of this discharge information lies with you and/or your care-partner.

## 2018-01-07 ENCOUNTER — Telehealth: Payer: Self-pay | Admitting: *Deleted

## 2018-01-07 NOTE — Telephone Encounter (Signed)
  Follow up Call-  Call back number 01/06/2018  Post procedure Call Back phone  # [phone]  Permission to leave phone message Yes  Some recent data might be hidden     Patient questions:  Do you have a fever, pain , or abdominal swelling? No. Pain Score  0 *  Have you tolerated food without any problems? Yes.    Have you been able to return to your normal activities? Yes.    Do you have any questions about your discharge instructions: Diet   No. Medications  No. Follow up visit  No.  Do you have questions or concerns about your Care? No.  Actions: * If pain score is 4 or above: No action needed, pain <4.

## 2018-01-13 ENCOUNTER — Encounter: Payer: Self-pay | Admitting: Gastroenterology

## 2018-01-23 ENCOUNTER — Other Ambulatory Visit (INDEPENDENT_AMBULATORY_CARE_PROVIDER_SITE_OTHER): Payer: PRIVATE HEALTH INSURANCE

## 2018-01-23 ENCOUNTER — Ambulatory Visit (INDEPENDENT_AMBULATORY_CARE_PROVIDER_SITE_OTHER): Payer: PRIVATE HEALTH INSURANCE | Admitting: Internal Medicine

## 2018-01-23 ENCOUNTER — Encounter: Payer: Self-pay | Admitting: Internal Medicine

## 2018-01-23 VITALS — BP 130/80 | HR 110 | Ht 66.5 in | Wt 169.4 lb

## 2018-01-23 DIAGNOSIS — Z23 Encounter for immunization: Secondary | ICD-10-CM

## 2018-01-23 DIAGNOSIS — J45909 Unspecified asthma, uncomplicated: Secondary | ICD-10-CM

## 2018-01-23 LAB — CBC WITH DIFFERENTIAL/PLATELET
Basophils Absolute: 0 10*3/uL (ref 0.0–0.1)
Basophils Relative: 0.6 % (ref 0.0–3.0)
EOS PCT: 2.2 % (ref 0.0–5.0)
Eosinophils Absolute: 0.1 10*3/uL (ref 0.0–0.7)
HEMATOCRIT: 45.3 % (ref 39.0–52.0)
HEMOGLOBIN: 15.4 g/dL (ref 13.0–17.0)
Lymphocytes Relative: 36.4 % (ref 12.0–46.0)
Lymphs Abs: 1.6 10*3/uL (ref 0.7–4.0)
MCHC: 33.9 g/dL (ref 30.0–36.0)
MCV: 87 fl (ref 78.0–100.0)
MONOS PCT: 12.2 % — AB (ref 3.0–12.0)
Monocytes Absolute: 0.5 10*3/uL (ref 0.1–1.0)
Neutro Abs: 2.1 10*3/uL (ref 1.4–7.7)
Neutrophils Relative %: 48.6 % (ref 43.0–77.0)
Platelets: 234 10*3/uL (ref 150.0–400.0)
RBC: 5.21 Mil/uL (ref 4.22–5.81)
RDW: 13.2 % (ref 11.5–15.5)
WBC: 4.4 10*3/uL (ref 4.0–10.5)

## 2018-01-23 NOTE — Patient Instructions (Signed)
Remember to blow symbicort out thru your nose   Please remember to go to the lab department downstairs in the basement  for your tests - we will call you with the results when they are available.  See calendar for specific medication instructions and bring it back for each and every office visit for every healthcare provider you see.  Without it,  you may not receive the best quality medical care that we feel you deserve.  You will note that the calendar groups together  your maintenance  medications that are timed at particular times of the day.  Think of this as your checklist for what your doctor has instructed you to do until your next evaluation to see what benefit  there is  to staying on a consistent group of medications intended to keep you well.  The other group at the bottom is entirely up to you to use as you see fit  for specific symptoms that may arise between visits that require you to treat them on an as needed basis.  Think of this as your action plan or "what if" list.   .        Please schedule a follow up office visit in 6 months, call sooner if needed

## 2018-01-23 NOTE — Progress Notes (Signed)
Subjective:     Patient ID: Mark Ward, male   DOB: Dec 22, 1965   MRN: [id number]  Brief patient profile:  43 yobm never smoker never significant allergies or asthma with new onset cough Feb 2014 variably improved then relapsed seen by ent x 3 by UC x3 2, x ER x 3 and finally admitted:   Admit date: 11/24/2012  Discharge date: 11/26/2012  Discharge Diagnoses:   Moderate persistent asthma with exacerbation     07/09/2013 NP  Folllow up and Med review  Returns for follow up and med review .  Reports breathing is doing well since last ov.  feels symptoms are more related to molds at work No SABA use.  Insurance will cover [employer].  Reviewed all his meds and organized them into a med calendar closely w/ pt education . Is following with ENT > sinus surgery 08/04/13 Mark Ward  rec Follow med calendar closely and bring to each visit. Continue on current regimen. Make sure to brush rinse and gargle after inhaler use. Continue followup with ENT    12/12/2015  [name]  Chief Complaint  Patient presents with  . Follow-up    pt states he is doing well, notes minor sinus congestion worse at work.    Very rare saba use / main issues are nasal and f/u Mark Ward  rec Plan A = Automatic = symbicort 160 Take 2 puffs first thing in am and then another 2 puffs about 12 hours later and singulair each am  Plan B = Backup Only use your albuterol as a rescue medication  Please schedule a follow up visit in 3 months but call sooner if needed  Use med calendar    03/15/2016  [name] Chief Complaint  Patient presents with  . Follow-up    Breathing is overall doing well. No new co's today.    main issues continue to be the nasal stuffiness  Does not understand action plan re saba but presently not needing at all    Not limited by breathing from desired activities  But not very active  rec No change  in meds/ bring med calendar to each ov    05/25/2016  [name]  Chief Complaint  Patient presents with  . Acute Visit    Pt c/o increased urination for the past several days.   has not seen PC in 4 years / no eyedrops  Says urine dribbles at work but not at home where has good flow  X 5 days s dysuria or fever but  assoc mouth dryness Plans to see Mark Ward for  Epistaxis   Rarely need proair now  Convince the air ducts at work are causing his nose to be too dry and bleed but no proair required at work rec Please remember to go to the lab  department  > Glucose over 500> admit    09/13/2016  [name]  / symbicort 160 2bid/ singulair  Chief Complaint  Patient presents with  . Follow-up    Breathing is doing well and no co's today.   no need for saba/ no med calendar Not very active but Not limited by breathing from desired activities   rec Plan A = Automatic = reduce symbicort to 80 Take 2 puffs first thing in am and then another 2 puffs  about 12 hours later.  Work on maintaining perfect inhaler technique:   Plan B = Backup Only use your albuterol Proair) as a rescue medication  Keep up with med calendar and update it with changes or don't use it at all but an inaccurate version may result in poor care.    06/24/2017  f/u ov/Mark Ward re: acute astham exac  Chief Complaint  Patient presents with  . Acute Visit    productive cough,yellow sputum,tried tylenol cold,apple cider vinager,throat has some soreness,chest congestion   prior 1st week in Feb 2019 on rare  saba maint on symb 80 and doing fine with ex/ sleep Then gradually worse cough/ sob with onset  nasal congestion >no rx Dyspnea:  Now sometimes at rest, better p saba but ran out "I guess I used too much" about a week agao  Cough: worse x one week/  ? Worse p supper and better in bed and sleeping flat fine rec Prednisone 10 mg take  4 each am x 2 days,    2 each am x 2 days,  1 each am x 2 days and stop  Augmentin 875 mg take one pill twice daily  X 10 days -  Plan A = Automatic = symbicort 80 Take 2 puffs first thing in am and then another 2 puffs about 12 hours later.  Work on [employer], community B = Backup Only use your albuterol as a rescue medication Please schedule a follow up visit in 3 months but call sooner if needed to see Mark Ward for new med calendar so bring all meds with you to this visit      NP eval/ recs 09/25/17 Continue on current regimen .  Follow medication calendar closely and bring to each visit .     01/23/2018  f/u ov/Mark Ward re: asthma/ no med calendar  Chief Complaint  Patient presents with  . Follow-up    Breathing is overall doing well. He rarely uses his albuterol inhaler.    Dyspnea:  Not limited by breathing from desired activities   Cough: minimal with nasal congestion Sleeping: flat/ 2 pillows  SABA use: rarely  02: none    No obvious day to day or daytime variability or assoc excess/ purulent sputum or mucus plugs or hemoptysis or cp or chest tightness, subjective wheeze or overt sinus or hb symptoms.   Sleeping as above without nocturnal  or early am exacerbation  of respiratory  c/o's or need for noct saba. Also denies any obvious fluctuation of symptoms with weather or environmental changes or other aggravating or alleviating factors except as outlined above   No unusual exposure hx or h/o childhood pna/ asthma or knowledge of premature birth.  Current Allergies, Complete Past Medical History, Past Surgical History, Family History, and Social History were reviewed in [employer] record.  ROS  The following are not active complaints unless bolded Hoarseness, sore throat, dysphagia, dental problems, itching, sneezing,  nasal congestion or discharge of excess mucus or purulent secretions, ear ache,   fever, chills, sweats, unintended wt loss or wt gain, classically pleuritic or  exertional cp,  orthopnea pnd or arm/hand swelling  or leg swelling, presyncope, palpitations, abdominal pain, anorexia, nausea, vomiting, diarrhea  or change in bowel habits or change in bladder habits, change in stools or change in urine, dysuria, hematuria,  rash, arthralgias, visual complaints, headache, numbness, weakness or ataxia or problems with walking or coordination,  change in mood or  memory.  Current Meds  Medication Sig  . acetaminophen (TYLENOL) 500 MG tablet Take 500 mg by mouth every 6 (six) hours as needed.  [name] albuterol (PROAIR HFA) 108 (90 Base) MCG/ACT inhaler 2 puffs every 4 hours if needed  . Aloe-Sodium Chloride (AYR SALINE NASAL GEL NA) As needed  . atorvastatin (LIPITOR) 10 MG tablet Take 1 tablet (10 mg total) by mouth daily.  . budesonide-formoterol (SYMBICORT) 80-4.5 MCG/ACT inhaler Inhale 2 puffs into the lungs 2 (two) times daily.  . cholecalciferol (VITAMIN D) 400 units TABS tablet Take 2,000 Units by mouth daily.   . fluticasone (FLONASE) 50 MCG/ACT nasal spray Place 1 spray into both nostrils 2 (two) times daily as needed for allergies or rhinitis.  [name] glucose blood (ONETOUCH VERIO) test strip Use as instructed  . guaifenesin (ROBITUSSIN) 100 MG/5ML syrup Take 200 mg by mouth 3 (three) times daily as needed for cough.  . Insulin Pen Needle 31G X 5 MM MISC Pt is to check blood sugar before breakfast and before evening meal  . Lancets (ONETOUCH ULTRASOFT) lancets Use as instructed  . Magnesium Oxide (MAG-CAPS PO) Take by mouth daily.  . metFORMIN (GLUCOPHAGE) 1000 MG tablet Take 1 tablet (1,000 mg total) by mouth 2 (two) times daily with a meal.  . montelukast (SINGULAIR) 10 MG tablet TAKE 1 TABLET BY MOUTH EVERYDAY AT BEDTIME  . Multiple Vitamins-Minerals (CENTRUM) tablet Take 1 tablet by mouth daily.  [name] OVER THE COUNTER MEDICATION Kyolic, Garlic extract, Two capsules every morning.  [name] OVER THE COUNTER MEDICATION Alpha Lipoic Acid 600 mg, Two capsules daily.   [name] OVER THE COUNTER MEDICATION Vitamin E, One capsule daily.  [name] OVER THE COUNTER MEDICATION Vitamin B- 100  complex . One tablet daily.  [name] OVER THE COUNTER MEDICATION Vitamin  B12- 1000 mcg, one tablet daily.  [name] oxymetazoline (AFRIN) 0.05 % nasal spray 2 puffs twice daily x 5 days, off for 2 wks.  . Probiotic Product (PROBIOTIC PO) Take 1 capsule by mouth daily.  [name] pyridOXINE (VITAMIN B-6) 100 MG tablet Take 100 mg by mouth daily.  . Saline GEL as needed.  . sodium chloride (OCEAN) 0.65 % SOLN nasal spray Place 1 spray into both nostrils as needed for congestion.  . Turmeric Curcumin 500 MG CAPS Take 500 mg by mouth 3 (three) times a week.  [name] UNABLE TO FIND Med Name: NAC 600 mg daily  . vitamin C (ASCORBIC ACID) 500 MG tablet Take 500 mg by mouth daily.                Objective:   Physical Exam   amb bm nad       01/02/2013  212  > 02/03/2013  215 > 05/12/2013 211 > 06/24/2013  212>210 07/09/13 > 12/30/2013  213 > 01/21/2014  208 >208 02/05/2014 > 03/26/2014 211 > 05/25/2014 216>211 06/22/2014 > 212 03/18/2015 >  09/09/2015   215 >   212 12/12/2015 > 03/15/2016  212 > 05/25/2016   191 > 09/13/2016  182 > 06/24/2017   173 > 01/23/2018     Vital signs reviewed - Note on arrival 02 sats  96% on RA       HEENT: nl dentition,   and oropharynx. Nl external ear canals without cough reflex - moderate bilateral non-specific turbinate edema     NECK :  without JVD/Nodes/TM/ nl carotid upstrokes bilaterally   LUNGS: no acc muscle use,  Nl contour chest which is clear to A and P bilaterally  without cough on insp or exp maneuvers   CV:  RRR  no s3 or murmur or increase in P2, and no edema   ABD:  soft and nontender with nl inspiratory excursion in the supine position. No bruits or organomegaly appreciated, bowel sounds nl  MS:  Nl gait/ ext warm without deformities, calf tenderness, cyanosis or clubbing No obvious joint restrictions   SKIN: warm and dry without lesions    NEURO:  alert, approp, nl  sensorium with  no motor or cerebellar deficits apparent.     Labs ordered 01/23/2018  Allergy testing     Assessment:

## 2018-01-24 LAB — RESPIRATORY ALLERGY PROFILE REGION II ~~LOC~~
Allergen, A. alternata, m6: <0.1 kU/L
Allergen, Comm Silver Birch, t9: <0.1 kU/L
Allergen, D pternoyssinus,d7: <0.1 kU/L
Allergen, Mouse Urine Protein, e78: <0.1 kU/L
Allergen, Mulberry, t76: <0.1 kU/L
Allergen, Oak,t7: <0.1 kU/L
Aspergillus fumigatus, m3: <0.1 kU/L
Bermuda Grass: <0.1 kU/L
Box Elder IgE: <0.1 kU/L
CLADOSPORIUM HERBARUM (M2) IGE: <0.1 kU/L
CLASS: 0
CLASS: 0
CLASS: 0
CLASS: 0
CLASS: 0
CLASS: 0
CLASS: 0
CLASS: 0
CLASS: 0
CLASS: 0
CLASS: 0
CLASS: 0
CLASS: 0
COMMON RAGWEED (SHORT) (W1) IGE: <0.1 kU/L
Class: 0
Class: 0
Class: 0
Class: 0
Class: 0
Class: 0
Class: 0
Class: 0
Class: 0
Class: 0
Class: 0
Cockroach: <0.1 kU/L
Elm IgE: <0.1 kU/L
IgE (Immunoglobulin E), Serum: 188 kU/L — ABNORMAL HIGH (ref <=114)
Johnson Grass: <0.1 kU/L
Pecan/Hickory Tree IgE: <0.1 kU/L
Sheep Sorrel IgE: <0.1 kU/L
Timothy Grass: <0.1 kU/L

## 2018-01-24 LAB — INTERPRETATION:

## 2018-01-26 ENCOUNTER — Encounter: Payer: Self-pay | Admitting: Internal Medicine

## 2018-01-26 NOTE — Assessment & Plan Note (Addendum)
-  PFT's 01/02/2013 wnl including fef 25-75 p am dulera 100 x 2 pffs - Med calendar 07/09/2013 > not using 12/30/2013 > not using action plans approp 01/21/2014 , 02/05/2014 , 06/22/2014  Or 05/25/2016  - 09/13/2016 p extensive coaching HFA effectiveness =    90% from baseline 75% - FENO 09/13/2016  =   29 on symb 160 x 2 bid but has aodm so try symb 80 2bid   - 01/23/2018  After extensive coaching inhaler device,  effectiveness =    90%  - Allergy profile 01/23/2018 >  Eos 0.1 /  IgE  188 RAST neg   Despite some disorganization with meds >   All goals of chronic asthma control met including optimal function and elimination of symptoms with minimal need for rescue therapy.  Contingencies discussed in full including contacting this office immediately if not controlling the symptoms using the rule of two's.     No change in rx/ f/u at q 6 m reasonable   I had an extended discussion with the patient reviewing all relevant studies completed to date and  lasting 15 to 20 minutes of a 25 minute visit    See device teaching which extended face to face time for this visit.  Each maintenance medication was reviewed in detail including emphasizing most importantly the difference between maintenance and prns and under what circumstances the prns are to be triggered using an action plan format that is not reflected in the computer generated alphabetically organized AVS which I have not found useful in most complex patients, especially with respiratory illnesses  Please see AVS for specific instructions unique to this visit that I personally wrote and verbalized to the the pt in detail and then reviewed with pt  by my nurse highlighting any  changes in therapy recommended at today's visit to their plan of care.

## 2018-01-27 NOTE — Progress Notes (Signed)
LMTCB

## 2018-01-27 NOTE — Progress Notes (Signed)
Was able to talk to patient regarding patient's results for their labs.  They verbalized an understanding of what was discussed. No further questions at this time.

## 2018-05-30 ENCOUNTER — Ambulatory Visit (INDEPENDENT_AMBULATORY_CARE_PROVIDER_SITE_OTHER): Payer: Self-pay | Admitting: Internal Medicine

## 2018-05-30 ENCOUNTER — Encounter: Payer: Self-pay | Admitting: Internal Medicine

## 2018-05-30 VITALS — BP 118/80 | HR 101 | Temp 98.4°F | Ht 66.5 in | Wt 166.2 lb

## 2018-05-30 DIAGNOSIS — J45909 Unspecified asthma, uncomplicated: Secondary | ICD-10-CM

## 2018-05-30 DIAGNOSIS — J310 Chronic rhinitis: Secondary | ICD-10-CM

## 2018-05-30 MED ORDER — PREDNISONE 10 MG PO TABS: ORAL_TABLET | ORAL | 0 refills | Status: DC

## 2018-05-30 MED ORDER — BUDESONIDE-FORMOTEROL FUMARATE 80-4.5 MCG/ACT IN AERO: 2.0000 | INHALATION_SPRAY | Freq: Two times a day (BID) | RESPIRATORY_TRACT | 0 refills | Status: DC

## 2018-05-30 MED ORDER — AMOXICILLIN-POT CLAVULANATE 875-125 MG PO TABS: 1.0000 | ORAL_TABLET | Freq: Two times a day (BID) | ORAL | 0 refills | Status: AC

## 2018-05-30 NOTE — Patient Instructions (Signed)
Prednisone 10 mg take  4 each am x 2 days,   2 each am x 2 days,  1 each am x 2 days and stop  Augmentin 875 mg take one pill twice daily  X 10 days - take at breakfast and supper with large glass of water.  It would help reduce the usual side effects (diarrhea and yeast infections) if you ate cultured yogurt at lunch.   Continue symbicort 80 Take 2 puffs first thing in am and then another 2 puffs about 12 hours later.      Please schedule a follow up visit in 3 months but call sooner if needed

## 2018-05-30 NOTE — Progress Notes (Signed)
Subjective:     Patient ID: Mark Ward, male   DOB: 11-Sep-1965   MRN: [id number]  Brief patient profile:  48 yobm never smoker never significant allergies or asthma with new onset cough Feb 2014 variably improved then relapsed seen by ent x 3 by UC x3 2, x ER x 3 and finally admitted:   Admit date: 11/24/2012  Discharge date: 11/26/2012  Discharge Diagnoses:   Moderate persistent asthma with exacerbation     07/09/2013 NP  Folllow up and Med review  Returns for follow up and med review .  Reports breathing is doing well since last ov.  feels symptoms are more related to molds at work No SABA use.  Insurance will cover [employer].  Reviewed all his meds and organized them into a med calendar closely w/ pt education . Is following with ENT > sinus surgery 08/04/13 Mark Ward  rec Follow med calendar closely and bring to each visit. Continue on current regimen. Make sure to brush rinse and gargle after inhaler use. Continue followup with ENT    12/12/2015  f/u ov/[name] re: chronic asthma/ maint rx symbicort 160 2bid/singulair  Chief Complaint  Patient presents with  . Follow-up    pt states he is doing well, notes minor sinus congestion worse at work.    Very rare saba use / main issues are nasal and f/u Mark Ward  rec Plan A = Automatic = symbicort 160 Take 2 puffs first thing in am and then another 2 puffs about 12 hours later and singulair each am  Plan B = Backup Only use your albuterol as a rescue medication  Please schedule a follow up visit in 3 months but call sooner if needed  Use med calendar     06/24/2017  f/u ov/[name] re: acute asthma exac  Chief Complaint  Patient presents with  . Acute Visit    productive cough,yellow sputum,tried tylenol cold,apple cider vinager,throat has some soreness,chest congestion   prior 1st week in Feb 2019 on rare  saba maint on symb 80 and doing fine with ex/ sleep Then gradually worse cough/ sob with onset  nasal congestion >no rx Dyspnea:   Now sometimes at rest, better p saba but ran out "I guess I used too much" about a week agao  Cough: worse x one week/  ? Worse p supper and better in bed and sleeping flat fine rec Prednisone 10 mg take  4 each am x 2 days,   2 each am x 2 days,  1 each am x 2 days and stop  Augmentin 875 mg take one pill twice daily  X 10 days -  Plan A = Automatic = symbicort 80 Take 2 puffs first thing in am and then another 2 puffs about 12 hours later.  Work on Education officer, community B = Backup Only use your albuterol as a rescue medication Please schedule a follow up visit in 3 months but call sooner if needed to see Mark Ward for new med calendar so bring all meds with you to this visit            05/30/2018  Acute extended ov/[name] re: asthma  With flare of cough ? Sinusitis  Chief Complaint  Patient presents with  . Acute Visit    Pt c/o cough, sore throat and nasal d/c x 5 days. He is coughing up bright yellow sputum and has bright yellow nasal d/c. He is using his albuterol inhaler daily.   Dyspnea:  Not  limited by breathing from desired activities   Cough: x 5 days with nasal d/c green/ s/p dental surgery 05/12/18  Sleeping: since onset worse due to nasal congestioon  SABA use: rarely unless out of symbicort 02: none    No obvious day to day or daytime variability or assoc excess/ purulent sputum or mucus plugs or hemoptysis or cp or chest tightness, subjective wheeze or overt   hb symptoms.     Also denies any obvious fluctuation of symptoms with weather or environmental changes or other aggravating or alleviating factors except as outlined above   No unusual exposure hx or h/o childhood pna/ asthma or knowledge of premature birth.  Current Allergies, Complete Past Medical History, Past Surgical History, Family History, and Social History were reviewed in [employer] record.  ROS  The following are not active complaints unless bolded Hoarseness, sore throat,  dysphagia, dental problems, itching, sneezing,  nasal congestion or discharge of excess mucus or purulent secretions, ear ache,   fever, chills, sweats, unintended wt loss or wt gain, classically pleuritic or exertional cp,  orthopnea pnd or arm/hand swelling  or leg swelling, presyncope, palpitations, abdominal pain, anorexia, nausea, vomiting, diarrhea  or change in bowel habits or change in bladder habits, change in stools or change in urine, dysuria, hematuria,  rash, arthralgias, visual complaints, headache, numbness, weakness or ataxia or problems with walking or coordination,  change in mood or  memory.        Current Meds  Medication Sig  . acetaminophen (TYLENOL) 500 MG tablet Take 500 mg by mouth every 6 (six) hours as needed.  [name] albuterol (PROAIR HFA) 108 (90 Base) MCG/ACT inhaler 2 puffs every 4 hours if needed  . Aloe-Sodium Chloride (AYR SALINE NASAL GEL NA) As needed  . atorvastatin (LIPITOR) 10 MG tablet Take 1 tablet (10 mg total) by mouth daily.  . budesonide-formoterol (SYMBICORT) 80-4.5 MCG/ACT inhaler Inhale 2 puffs into the lungs 2 (two) times daily.  . cholecalciferol (VITAMIN D) 400 units TABS tablet Take 2,000 Units by mouth daily.   . fluticasone (FLONASE) 50 MCG/ACT nasal spray Place 1 spray into both nostrils 2 (two) times daily as needed for allergies or rhinitis.  [name] glucose blood (ONETOUCH VERIO) test strip Use as instructed  . guaifenesin (ROBITUSSIN) 100 MG/5ML syrup Take 200 mg by mouth 3 (three) times daily as needed for cough.  . Insulin Pen Needle 31G X 5 MM MISC Pt is to check blood sugar before breakfast and before evening meal  . Lancets (ONETOUCH ULTRASOFT) lancets Use as instructed  . LYSINE PO Take 1 capsule by mouth daily.  [name] MAGNESIUM PO Take 1 tablet by mouth daily.  . metFORMIN (GLUCOPHAGE) 1000 MG tablet Take 1 tablet (1,000 mg total) by mouth 2 (two) times daily with a meal.  . montelukast (SINGULAIR) 10 MG tablet TAKE 1 TABLET BY MOUTH EVERYDAY AT  BEDTIME  . Multiple Vitamins-Minerals (CENTRUM) tablet Take 1 tablet by mouth daily.  [name] OVER THE COUNTER MEDICATION Vitamin E, One capsule daily.  [name] oxymetazoline (AFRIN) 0.05 % nasal spray 2 puffs twice daily x 5 days, off for 2 wks.  . Probiotic Product (PROBIOTIC PO) Take 1 capsule by mouth daily.  . Saline GEL as needed.  . sodium chloride (OCEAN) 0.65 % SOLN nasal spray Place 1 spray into both nostrils as needed for congestion.  . Turmeric Curcumin 500 MG CAPS Take 500 mg by mouth 3 (three) times a week.  . vitamin C (  ASCORBIC ACID) 500 MG tablet Take 500 mg by mouth daily.              Objective:   Physical Exam  amb bm nad nasal tone to voice   01/02/2013  212  > 02/03/2013  215 > 05/12/2013 211 > 06/24/2013  212>210 07/09/13 > 12/30/2013  213 > 01/21/2014  208 >208 02/05/2014 > 03/26/2014 211 > 05/25/2014 216>211 06/22/2014 > 212 03/18/2015 >  09/09/2015   215 >   212 12/12/2015 > 03/15/2016  212 > 05/25/2016   191 > 09/13/2016  182 > 06/24/2017   173 >  05/30/2018    166     HEENT: nl dentition, turbinates bilaterally, and oropharynx. Nl external ear canals without cough reflex   NECK :  without JVD/Nodes/TM/ nl carotid upstrokes bilaterally   LUNGS: no acc muscle use,  Nl contour chest with mostly transmitted upper airway noise  without cough on insp or exp maneuvers   CV:  RRR  no s3 or murmur or increase in P2, and no edema   ABD:  soft and nontender with nl inspiratory excursion in the supine position. No bruits or organomegaly appreciated, bowel sounds nl  MS:  Nl gait/ ext warm without deformities, calf tenderness, cyanosis or clubbing No obvious joint restrictions   SKIN: warm and dry without lesions    NEURO:  alert, approp, nl sensorium with  no motor or cerebellar deficits apparent.            Assessment:

## 2018-05-31 ENCOUNTER — Encounter: Payer: Self-pay | Admitting: Internal Medicine

## 2018-05-31 NOTE — Assessment & Plan Note (Signed)
Onset 2014 with upper airway component  - PFT's 01/02/2013 wnl including fef 25-75 p am dulera 100 x 2 pffs  - FENO 09/13/2016  =   29 on symb 160 x 2 bid but has aodm so try symb 80 2bid  - Allergy profile 01/23/2018 >  Eos 0.1 /  IgE  188 RAST neg   - 05/30/2018  After extensive coaching inhaler device,  effectiveness =    90%  Now flaring by hx despite "flare" really little to suggest asthma component here so no change rx needed

## 2018-05-31 NOTE — Assessment & Plan Note (Addendum)
-    Sinus CT 05/05/13 > Complete opacification of visualized portions of the frontal sinuses and ethmoid sinus air cells.  - rec ENT eval 05/06/2013 > surgery [name]) August 04 2013 > improved - Singulair trial 03/26/2014 >>>  - Allergy profile 03/26/2014 > IgE 77 with neg RAST - referred back to [facility] for epistaxis 05/25/2016 >>>    Acute flare cough/pseudowheeze with nasal discharge p dental surgery  So rx with 10 days augmentin and either extend to 20 or refer to ENT depending on initial response and also pred x 6 for any inflammary component.    I had an extended discussion with the patient reviewing all relevant studies completed to date and  lasting 15 to 20 minutes of a 25 minute acute offic visit    See device teaching which extended face to face time for this visit.  Each maintenance medication was reviewed in detail including emphasizing most importantly the difference between maintenance and prns and under what circumstances the prns are to be triggered using an action plan format that is not reflected in the computer generated alphabetically organized AVS which I have not found useful in most complex patients, especially with respiratory illnesses  Please see AVS for specific instructions unique to this visit that I personally wrote and verbalized to the the pt in detail and then reviewed with pt  by my nurse highlighting any  changes in therapy recommended at today's visit to their plan of care.

## 2018-07-04 ENCOUNTER — Telehealth: Payer: Self-pay | Admitting: Internal Medicine

## 2018-07-04 MED ORDER — BUDESONIDE-FORMOTEROL FUMARATE 80-4.5 MCG/ACT IN AERO: 2.0000 | INHALATION_SPRAY | Freq: Two times a day (BID) | RESPIRATORY_TRACT | 2 refills | Status: DC

## 2018-07-04 NOTE — Telephone Encounter (Signed)
Patient calling for refill to symbicort 80. Dr. Melvyn Novas wants patient to continue medication per last office note. Patient next OV is 07/2018 gave patient Refill to get to next appt. Will be more at this appt.   Nothing further needed at this time.

## 2018-07-21 ENCOUNTER — Other Ambulatory Visit: Payer: Self-pay | Admitting: Internal Medicine

## 2018-07-24 ENCOUNTER — Ambulatory Visit: Payer: PRIVATE HEALTH INSURANCE | Admitting: Internal Medicine

## 2018-08-13 ENCOUNTER — Encounter: Payer: Self-pay | Admitting: Internal Medicine

## 2018-08-28 ENCOUNTER — Ambulatory Visit: Payer: Self-pay | Admitting: Internal Medicine

## 2018-08-28 ENCOUNTER — Ambulatory Visit (INDEPENDENT_AMBULATORY_CARE_PROVIDER_SITE_OTHER): Payer: Self-pay | Admitting: Internal Medicine

## 2018-08-28 ENCOUNTER — Other Ambulatory Visit: Payer: Self-pay

## 2018-08-28 VITALS — BP 130/80 | HR 102 | Wt 176.0 lb

## 2018-08-28 DIAGNOSIS — J310 Chronic rhinitis: Secondary | ICD-10-CM

## 2018-08-28 DIAGNOSIS — J45909 Unspecified asthma, uncomplicated: Secondary | ICD-10-CM

## 2018-08-28 MED ORDER — ALBUTEROL SULFATE HFA 108 (90 BASE) MCG/ACT IN AERS: INHALATION_SPRAY | RESPIRATORY_TRACT | 1 refills | Status: DC

## 2018-08-28 MED ORDER — PREDNISONE 10 MG PO TABS: ORAL_TABLET | ORAL | 0 refills | Status: DC

## 2018-08-28 MED ORDER — BUDESONIDE-FORMOTEROL FUMARATE 80-4.5 MCG/ACT IN AERO: 2.0000 | INHALATION_SPRAY | Freq: Two times a day (BID) | RESPIRATORY_TRACT | 11 refills | Status: DC

## 2018-08-28 MED ORDER — MONTELUKAST SODIUM 10 MG PO TABS: ORAL_TABLET | ORAL | 3 refills | Status: DC

## 2018-08-28 NOTE — Patient Instructions (Signed)
Plan A = Automatic = symbicort 80 Take 2 puffs first thing in am and then another 2 puffs about 12 hours later.   Work on inhaler technique:  relax and gently blow all the way out then take a nice smooth deep breath back in, triggering the inhaler at same time you start breathing in.  Hold for up to 5 seconds if you can. Blow out thru nose. Rinse and gargle with water when done     Plan B = Backup Only use your albuterol inhaler as a rescue medication to be used if you can't catch your breath by resting or doing a relaxed purse lip breathing pattern.  - The less you use it, the better it will work when you need it. - Ok to use the inhaler up to 2 puffs  every 4 hours if you must but call for appointment if use goes up over your usual need - Don't leave home without it !!  (think of it like the spare tire for your car)    If get worse >  Prednisone 10 mg take  4 each am x 2 days,   2 each am x 2 days,  1 each am x 2 days and stop    Please schedule a follow up visit in 6 months but call sooner if needed  with all medications /inhalers/ solutions in hand so we can verify exactly what you are taking. This includes all medications from all doctors and over the counters

## 2018-08-28 NOTE — Progress Notes (Signed)
Subjective:     Patient ID: Mark Ward, male   DOB: 11-Sep-1965   MRN: [id number]  Brief patient profile:  48 yobm never smoker never significant allergies or asthma with new onset cough Feb 2014 variably improved then relapsed seen by ent x 3 by UC x3 2, x ER x 3 and finally admitted:   Admit date: 11/24/2012  Discharge date: 11/26/2012  Discharge Diagnoses:   Moderate persistent asthma with exacerbation     07/09/2013 NP  Folllow up and Med review  Returns for follow up and med review .  Reports breathing is doing well since last ov.  feels symptoms are more related to molds at work No SABA use.  Insurance will cover [employer].  Reviewed all his meds and organized them into a med calendar closely w/ pt education . Is following with ENT > sinus surgery 08/04/13 Mark Ward  rec Follow med calendar closely and bring to each visit. Continue on current regimen. Make sure to brush rinse and gargle after inhaler use. Continue followup with ENT    12/12/2015  f/u ov/[name] re: chronic asthma/ maint rx symbicort 160 2bid/singulair  Chief Complaint  Patient presents with  . Follow-up    pt states he is doing well, notes minor sinus congestion worse at work.    Very rare saba use / main issues are nasal and f/u Mark Ward  rec Plan A = Automatic = symbicort 160 Take 2 puffs first thing in am and then another 2 puffs about 12 hours later and singulair each am  Plan B = Backup Only use your albuterol as a rescue medication  Please schedule a follow up visit in 3 months but call sooner if needed  Use med calendar     06/24/2017  f/u ov/Mark Ward re: acute asthma exac  Chief Complaint  Patient presents with  . Acute Visit    productive cough,yellow sputum,tried tylenol cold,apple cider vinager,throat has some soreness,chest congestion   prior 1st week in Feb 2019 on rare  saba maint on symb 80 and doing fine with ex/ sleep Then gradually worse cough/ sob with onset  nasal congestion >no rx Dyspnea:   Now sometimes at rest, better p saba but ran out "I guess I used too much" about a week agao  Cough: worse x one week/  ? Worse p supper and better in bed and sleeping flat fine rec Prednisone 10 mg take  4 each am x 2 days,   2 each am x 2 days,  1 each am x 2 days and stop  Augmentin 875 mg take one pill twice daily  X 10 days -  Plan A = Automatic = symbicort 80 Take 2 puffs first thing in am and then another 2 puffs about 12 hours later.  Work on Education officer, community B = Backup Only use your albuterol as a rescue medication Please schedule a follow up visit in 3 months but call sooner if needed to see [name] for new med calendar so bring all meds with you to this visit            05/30/2018  Acute extended ov/Mark Ward re: asthma  With flare of cough ? Sinusitis  Chief Complaint  Patient presents with  . Acute Visit    Pt c/o cough, sore throat and nasal d/c x 5 days. He is coughing up bright yellow sputum and has bright yellow nasal d/c. He is using his albuterol inhaler daily.   Dyspnea:  Not  limited by breathing from desired activities   Cough: x 5 days with nasal d/c green/ s/p dental surgery 05/12/18  Sleeping: since onset worse due to nasal congestioon  SABA use: rarely unless out of symbicort rec Prednisone 10 mg take  4 each am x 2 days,   2 each am x 2 days,  1 each am x 2 days and stop Augmentin 875 mg take one pill twice daily  X 10 days Continue symbicort 80 Take 2 puffs first thing in am and then another 2 puffs about 12 hours later   08/28/2018  f/u ov/Mark Ward re: asthma / does fine as long as maint on symbicort 80/ singulair  Chief Complaint  Patient presents with  . Follow-up    3 mth f/u, doing well, needs refill on albuterol inhaler   Dyspnea:  Not limited by breathing from desired activities / pushes mower x 1.5 h  Cough: daytime time / min productive not noct Sleeping: 2 pillows / bed is flat SABA use: ran out early  because used it all up p symbicort ran out  first 02: none    No obvious day to day or daytime variability or assoc excess/ purulent sputum or mucus plugs or hemoptysis or cp or chest tightness, subjective wheeze or overt sinus or hb symptoms.   Sleeping fine  without nocturnal  or early am exacerbation  of respiratory  c/o's or need for noct saba. Also denies any obvious fluctuation of symptoms with weather or environmental changes or other aggravating or alleviating factors except as outlined above   No unusual exposure hx or h/o childhood pna/ asthma or knowledge of premature birth.  Current Allergies, Complete Past Medical History, Past Surgical History, Family History, and Social History were reviewed in [employer] record.  ROS  The following are not active complaints unless bolded Hoarseness, sore throat, dysphagia, dental problems, itching, sneezing,  nasal congestion or discharge of excess mucus or purulent secretions, ear ache,   fever, chills, sweats, unintended wt loss or wt gain, classically pleuritic or exertional cp,  orthopnea pnd or arm/hand swelling  or leg swelling, presyncope, palpitations, abdominal pain, anorexia, nausea, vomiting, diarrhea  or change in bowel habits or change in bladder habits, change in stools or change in urine, dysuria, hematuria,  rash, arthralgias, visual complaints, headache, numbness, weakness or ataxia or problems with walking or coordination,  change in mood or  memory.        Current Meds  Medication Sig  . acetaminophen (TYLENOL) 500 MG tablet Take 500 mg by mouth every 6 (six) hours as needed.  [name] albuterol (PROAIR HFA) 108 (90 Base) MCG/ACT inhaler 2 puffs every 4 hours if needed  . Aloe-Sodium Chloride (AYR SALINE NASAL GEL NA) As needed  . atorvastatin (LIPITOR) 10 MG tablet Take 1 tablet (10 mg total) by mouth daily.  . budesonide-formoterol (SYMBICORT) 80-4.5 MCG/ACT inhaler Inhale 2 puffs into the lungs 2 (two) times daily.  . cholecalciferol (VITAMIN D) 400  units TABS tablet Take 2,000 Units by mouth daily.   . fluticasone (FLONASE) 50 MCG/ACT nasal spray Place 1 spray into both nostrils 2 (two) times daily as needed for allergies or rhinitis.  [name] glucose blood (ONETOUCH VERIO) test strip Use as instructed  . guaifenesin (ROBITUSSIN) 100 MG/5ML syrup Take 200 mg by mouth 3 (three) times daily as needed for cough.  . Insulin Pen Needle 31G X 5 MM MISC Pt is to check blood sugar before breakfast and before  evening meal  . Lancets (ONETOUCH ULTRASOFT) lancets Use as instructed  . LYSINE PO Take 1 capsule by mouth daily.  [name] MAGNESIUM PO Take 1 tablet by mouth daily.  . metFORMIN (GLUCOPHAGE) 1000 MG tablet Take 1 tablet (1,000 mg total) by mouth 2 (two) times daily with a meal.  . montelukast (SINGULAIR) 10 MG tablet TAKE 1 TABLET BY MOUTH EVERYDAY AT BEDTIME  . Multiple Vitamins-Minerals (CENTRUM) tablet Take 1 tablet by mouth daily.  [name] OVER THE COUNTER MEDICATION Vitamin E, One capsule daily.  [name] oxymetazoline (AFRIN) 0.05 % nasal spray 2 puffs twice daily x 5 days, off for 2 wks.  . predniSONE (DELTASONE) 10 MG tablet Take  4 each am x 2 days,   2 each am x 2 days,  1 each am x 2 days and stop  . Probiotic Product (PROBIOTIC PO) Take 1 capsule by mouth daily.  . Saline GEL as needed.  . sodium chloride (OCEAN) 0.65 % SOLN nasal spray Place 1 spray into both nostrils as needed for congestion.  . Turmeric Curcumin 500 MG CAPS Take 500 mg by mouth 3 (three) times a week.  . vitamin C (ASCORBIC ACID) 500 MG tablet Take 500 mg by mouth daily.           Objective:   Physical Exam     01/02/2013  212  > 02/03/2013  215 > 05/12/2013 211 > 06/24/2013  212>210 07/09/13 > 12/30/2013  213 > 01/21/2014  208 >208 02/05/2014 > 03/26/2014 211 > 05/25/2014 216>211 06/22/2014 > 212 03/18/2015 >  09/09/2015   215 >   212 12/12/2015 > 03/15/2016  212 > 05/25/2016   191 > 09/13/2016  182 > 06/24/2017   173 >  05/30/2018    166 > 08/28/2018  176   amb bm nad   HEENT: nl  dentition, turbinates bilaterally, and oropharynx. Nl external ear canals without cough reflex   NECK :  without JVD/Nodes/TM/ nl carotid upstrokes bilaterally   LUNGS: no acc muscle use,  Nl contour chest which is clear to A and P bilaterally without cough on insp or exp maneuvers   CV:  RRR  no s3 or murmur or increase in P2, and no edema   ABD:  soft and nontender with nl inspiratory excursion in the supine position. No bruits or organomegaly appreciated, bowel sounds nl  MS:  Nl gait/ ext warm without deformities, calf tenderness, cyanosis or clubbing No obvious joint restrictions   SKIN: warm and dry without lesions    NEURO:  alert, approp, nl sensorium with  no motor or cerebellar deficits apparent.             Assessment:

## 2018-08-29 ENCOUNTER — Encounter: Payer: Self-pay | Admitting: Internal Medicine

## 2018-08-29 NOTE — Assessment & Plan Note (Addendum)
Onset 2014 with upper airway component  - PFT's 01/02/2013 wnl including fef 25-75 p am dulera 100 x 2 pffs  - FENO 09/13/2016  =   31 on symb 160 x 2 bid but has aodm so try symb 80 2bid  - Allergy profile 01/23/2018 >  Eos 0.1 /  IgE  188 RAST neg  08/28/2018  After extensive coaching inhaler device,  effectiveness =    75%   As long as on symb80/singulair >>> All goals of chronic asthma control met including optimal function and elimination of symptoms with minimal need for rescue therapy.  Contingencies discussed in full including contacting this office immediately if not controlling the symptoms using the rule of two's.     If flares this spring given rx for pred x 6 day as contingency   Each maintenance medication was reviewed in detail including most importantly the difference between maintenance and as needed and under what circumstances the prns are to be used.  Please see AVS for specific  Instructions which are unique to this visit and I personally typed out  which were reviewed in detail in writing with the patient and a copy provided.

## 2018-08-29 NOTE — Assessment & Plan Note (Signed)
-    Sinus CT 05/05/13 > Complete opacification of visualized portions of the frontal sinuses and ethmoid sinus air cells.  - rec ENT eval 05/06/2013 > surgery [name]) August 04 2013 > improved - Singulair trial 03/26/2014 >>>  - Allergy profile 03/26/2014 > IgE 77 with neg RAST - referred back to [facility] for epistaxis 05/25/2016 >>>   Reminded re use of nasal steroids/ afrin:  I emphasized that nasal steroids have no immediate benefit in terms of improving symptoms.  To help them reached the target tissue, the patient should use Afrin two puffs every 12 hours applied one min before using the nasal steroids.  Afrin should be stopped after no more than 5 days.  If the symptoms worsen, Afrin can be restarted after 5 days off of therapy to prevent rebound congestion from overuse of Afrin.  I also emphasized that in no way are nasal steroids a concern in terms of "addiction".

## 2018-10-16 ENCOUNTER — Other Ambulatory Visit: Payer: Self-pay | Admitting: Internal Medicine

## 2018-10-16 MED ORDER — BUDESONIDE-FORMOTEROL FUMARATE 80-4.5 MCG/ACT IN AERO: 2.0000 | INHALATION_SPRAY | Freq: Two times a day (BID) | RESPIRATORY_TRACT | 11 refills | Status: DC

## 2018-10-17 ENCOUNTER — Other Ambulatory Visit: Payer: Self-pay | Admitting: Internal Medicine

## 2018-10-27 ENCOUNTER — Telehealth: Payer: Self-pay | Admitting: Internal Medicine

## 2018-10-27 MED ORDER — AZITHROMYCIN 250 MG PO TABS: ORAL_TABLET | ORAL | 0 refills | Status: DC

## 2018-10-27 NOTE — Telephone Encounter (Signed)
Called and spoke with pt letting him know that MW said to send in zpak to see if this would help and after med if still not better to call and schedule an appt. Pt verbalized understanding. Verified pt's preferred pharmacy and sent Rx in. Nothing further needed.

## 2018-10-27 NOTE — Telephone Encounter (Signed)
zpak  If not better in one week > ov with all meds in hand

## 2018-10-27 NOTE — Telephone Encounter (Signed)
Spoke to pt.  Pt states he has a deep cough with yellow mucus x2-3 weeks.  No SOB noted.  Has been taking mucinex and robitussin.  Dr Melvyn Novas, please advise.

## 2018-11-03 ENCOUNTER — Other Ambulatory Visit: Payer: Self-pay

## 2018-11-03 ENCOUNTER — Ambulatory Visit (INDEPENDENT_AMBULATORY_CARE_PROVIDER_SITE_OTHER): Payer: Self-pay | Admitting: Family Medicine

## 2018-11-03 ENCOUNTER — Encounter: Payer: Self-pay | Admitting: Family Medicine

## 2018-11-03 VITALS — BP 130/82 | HR 102 | Temp 98.2°F | Ht 66.5 in | Wt 190.1 lb

## 2018-11-03 DIAGNOSIS — Z0000 Encounter for general adult medical examination without abnormal findings: Secondary | ICD-10-CM

## 2018-11-03 DIAGNOSIS — Z13220 Encounter for screening for lipoid disorders: Secondary | ICD-10-CM

## 2018-11-03 DIAGNOSIS — Z23 Encounter for immunization: Secondary | ICD-10-CM

## 2018-11-03 DIAGNOSIS — E1165 Type 2 diabetes mellitus with hyperglycemia: Secondary | ICD-10-CM

## 2018-11-03 DIAGNOSIS — Z125 Encounter for screening for malignant neoplasm of prostate: Secondary | ICD-10-CM

## 2018-11-03 LAB — MICROALBUMIN / CREATININE URINE RATIO
Creatinine,U: 51.1 mg/dL
Microalb Creat Ratio: 1.9 mg/g (ref 0.0–30.0)
Microalb, Ur: 1 mg/dL (ref 0.0–1.9)

## 2018-11-03 LAB — COMPREHENSIVE METABOLIC PANEL
ALT: 31 U/L (ref 0–53)
AST: 24 U/L (ref 0–37)
Albumin: 4.7 g/dL (ref 3.5–5.2)
Alkaline Phosphatase: 63 U/L (ref 39–117)
BUN: 12 mg/dL (ref 6–23)
CO2: 30 mEq/L (ref 19–32)
Calcium: 9.5 mg/dL (ref 8.4–10.5)
Chloride: 102 mEq/L (ref 96–112)
Creatinine, Ser: 0.79 mg/dL (ref 0.40–1.50)
GFR: 125.05 mL/min (ref >60.00)
Glucose, Bld: 103 mg/dL — ABNORMAL HIGH (ref 70–99)
Potassium: 3.9 mEq/L (ref 3.5–5.1)
Sodium: 140 mEq/L (ref 135–145)
Total Bilirubin: 0.6 mg/dL (ref 0.2–1.2)
Total Protein: 7.3 g/dL (ref 6.0–8.3)

## 2018-11-03 LAB — LIPID PANEL
Cholesterol: 141 mg/dL (ref 0–200)
HDL: 46.9 mg/dL (ref >39.00)
LDL Cholesterol: 66 mg/dL (ref 0–99)
NonHDL: 93.89
Total CHOL/HDL Ratio: 3
Triglycerides: 139 mg/dL (ref 0.0–149.0)
VLDL: 27.8 mg/dL (ref 0.0–40.0)

## 2018-11-03 LAB — PSA: PSA: 0.29 ng/mL (ref 0.10–4.00)

## 2018-11-03 LAB — HEMOGLOBIN A1C: Hgb A1c MFr Bld: 5.7 % (ref 4.6–6.5)

## 2018-11-03 NOTE — Patient Instructions (Signed)
A few things to remember from today's visit:   Routine general medical examination at a health care facility  Type 2 diabetes mellitus with hyperglycemia, without long-term current use of insulin ([place]) - Plan: Microalbumin / creatinine urine ratio, Comprehensive metabolic panel, Hemoglobin A1c  Prostate cancer screening - Plan: PSA(Must document that pt has been informed of limitations of PSA testing.)  Screening for lipoid disorders - Plan: Lipid panel  If hemoglobin A1 C is < 6.0 we could decrease dose of Metformin. You need an eye exam.  At least 150 minutes of moderate exercise per week, daily brisk walking for 15-30 min is a good exercise option. Healthy diet low in saturated (animal) fats and sweets and consisting of fresh fruits and vegetables, lean meats such as fish and white chicken and whole grains.  - Vaccines:  Tdap vaccine every 10 years.  Shingles vaccine recommended at age 91, could be given after 51 years of age but not sure about insurance coverage.  Pneumonia vaccines:  Prevnar 13 at 65 and Pneumovax at 42. Because diabetes Pneumovax given today.   -Screening recommendations for low/normal risk males:   Lipid screening Annually because diabetes.   Colon cancer screening at age 75 and until age 25.  Prostate cancer screening: some controversy, starts usually at 72: Rectal exam and PSA.  Aortic Abdominal Aneurism once between 26 and 66 years old if ever smoker.  Also recommended:  1. Dental visit- Brush and floss your teeth twice daily; visit your dentist twice a year. 2. Eye doctor- Get an eye exam at least every 2 years. 3. Helmet use- Always wear a helmet when riding a bicycle, motorcycle, rollerblading or skateboarding. 4. Safe sex- If you may be exposed to sexually transmitted infections, use a condom. 5. Seat belts- Seat belts can save your live; always wear one. 6. Smoke/Carbon Monoxide detectors- These detectors need to be installed on the  appropriate level of your home. Replace batteries at least once a year. 7. Skin cancer- When out in the sun please cover up and use sunscreen 15 SPF or higher. 8. Violence- If anyone is threatening or hurting you, please tell your healthcare provider.  9. Drink alcohol in moderation- Limit alcohol intake to one drink or less per day. Never drink and drive.  Please be sure medication list is accurate. If a new problem present, please set up appointment sooner than planned today.

## 2018-11-03 NOTE — Progress Notes (Signed)
HPI:   [name] is a 53 y.o. male, who is here today to establish care.  Former PCP: Dr Lianne Cure Last preventive routine visit: 09/2017.  Chronic medical problems: Asthma,allergic rhinitis,DM II,migrane/headaches,and OA.  He follows with pulmonologist, Dr Melvyn Novas.  Concerns today: He would like to have a CPE today.  He lives with his mother. He does not exercise regularly and has not been consistent with a healthful diet.  Colonoscopy 12/2017. Prostate cancer screening has not been done before. Denies increased urinary frequency,nocturia,or urine dribbling.  DM II, Dx on 05/25/16, when he presented to the ER because polyuria and polydipsia, glucose was 413.He has been on Metformin since Dx.  BS's q 3 days, yesterday 111. Currently he is on Metformin 1000 mg bid.  Tolerating medication well, no side effects reported.  Lab Results  Component Value Date   HGBA1C 5.1 11/12/2017   Denies abdominal pain, nausea,vomiting, polydipsia,polyuria, or polyphagia.   Review of Systems  Constitutional: Negative for activity change, appetite change, fatigue, fever and unexpected weight change.  HENT: Negative for dental problem, nosebleeds, sore throat, trouble swallowing and voice change.   Eyes: Negative for redness and visual disturbance.  Respiratory: Negative for cough, shortness of breath and wheezing.   Cardiovascular: Negative for chest pain, palpitations and leg swelling.  Gastrointestinal: Negative for abdominal pain, blood in stool, nausea and vomiting.  Endocrine: Negative for cold intolerance and heat intolerance.  Genitourinary: Negative for decreased urine volume, dysuria, genital sores, hematuria and testicular pain.  Musculoskeletal: Negative for arthralgias, gait problem and joint swelling.  Skin: Negative for color change and rash.  Neurological: Negative for syncope, weakness, numbness and headaches.  Hematological: Negative for adenopathy. Does not  bruise/bleed easily.  Psychiatric/Behavioral: Negative for confusion and sleep disturbance. The patient is not nervous/anxious.   All other systems reviewed and are negative.   Current Outpatient Medications on File Prior to Visit  Medication Sig Dispense Refill  . acetaminophen (TYLENOL) 500 MG tablet Take 500 mg by mouth every 6 (six) hours as needed.    [name] albuterol (PROAIR HFA) 108 (90 Base) MCG/ACT inhaler 2 puffs every 4 hours if needed 1 Inhaler 1  . Aloe-Sodium Chloride (AYR SALINE NASAL GEL NA) As needed    . atorvastatin (LIPITOR) 10 MG tablet Take 1 tablet (10 mg total) by mouth daily. 90 tablet 3  . budesonide-formoterol (SYMBICORT) 80-4.5 MCG/ACT inhaler Inhale 2 puffs into the lungs 2 (two) times daily. 1 Inhaler 11  . cholecalciferol (VITAMIN D) 400 units TABS tablet Take 2,000 Units by mouth daily.     . fluticasone (FLONASE) 50 MCG/ACT nasal spray Place 1 spray into both nostrils 2 (two) times daily as needed for allergies or rhinitis.    [name] glucose blood (ONETOUCH VERIO) test strip Use as instructed 100 each 12  . guaifenesin (ROBITUSSIN) 100 MG/5ML syrup Take 200 mg by mouth 3 (three) times daily as needed for cough.    . Insulin Pen Needle 31G X 5 MM MISC Pt is to check blood sugar before breakfast and before evening meal 100 each 3  . Lancets (ONETOUCH ULTRASOFT) lancets Use as instructed 100 each 12  . LYSINE PO Take 1 capsule by mouth daily.    [name] MAGNESIUM PO Take 1 tablet by mouth daily.    . montelukast (SINGULAIR) 10 MG tablet One daily 90 tablet 3  . Multiple Vitamins-Minerals (CENTRUM) tablet Take 1 tablet by mouth daily.    [name] OVER THE COUNTER MEDICATION  Vitamin E, One capsule daily.    [name] oxymetazoline (AFRIN) 0.05 % nasal spray 2 puffs twice daily x 5 days, off for 2 wks.    . Probiotic Product (PROBIOTIC PO) Take 1 capsule by mouth daily.    . Saline GEL as needed.    . sodium chloride (OCEAN) 0.65 % SOLN nasal spray Place 1 spray into both nostrils as needed for  congestion.    . Turmeric Curcumin 500 MG CAPS Take 500 mg by mouth 3 (three) times a week.    . vitamin C (ASCORBIC ACID) 500 MG tablet Take 500 mg by mouth daily.     No current facility-administered medications on file prior to visit.      Past Medical History:  Diagnosis Date  . Allergy   . Bronchitis   . Chronic sinusitis   . Diabetes mellitus without complication ([place])   . Migraines    No Known Allergies  Family History  Problem Relation Age of Onset  . Diabetes Father   . Hyperlipidemia Mother   . Hypertension Mother   . Hypertension Brother   . Heart disease Maternal Grandmother   . Hyperlipidemia Maternal Grandfather   . Hypertension Maternal Grandfather   . Diabetes Paternal Grandmother   . Heart disease Paternal Grandfather   . Colon cancer Neg Hx   . Esophageal cancer Neg Hx   . Prostate cancer Neg Hx   . Rectal cancer Neg Hx     Social History   Socioeconomic History  . Marital status: Single    Spouse name: Not on file  . Number of children: Not on file  . Years of education: Not on file  . Highest education level: Not on file  Occupational History  . Occupation: Research scientist (physical sciences): [employer]  . Financial resource strain: Not on file  . Food insecurity    Worry: Not on file    Inability: Not on file  . Transportation needs    Medical: Not on file    Non-medical: Not on file  Tobacco Use  . Smoking status: Never Smoker  . Smokeless tobacco: Never Used  Substance and Sexual Activity  . Alcohol use: No  . Drug use: No  . Sexual activity: Not Currently  Lifestyle  . Physical activity    Days per week: Not on file    Minutes per session: Not on file  . Stress: Not on file  Relationships  . Social Herbalist on phone: Not on file    Gets together: Not on file    Attends religious service: Not on file    Active member of club or organization: Not on file    Attends meetings of clubs or organizations: Not on  file    Relationship status: Not on file  Other Topics Concern  . Not on file  Social History Narrative  . Not on file    Vitals:   11/03/18 1030  BP: 130/82  Pulse: (!) 102  Temp: 98.2 F (36.8 C)  SpO2: 97%    Body mass index is 30.23 kg/m.   Physical Exam  Nursing note and vitals reviewed. Constitutional: He is oriented to person, place, and time. He appears well-developed. No distress.  HENT:  Head: Atraumatic.  Right Ear: External ear normal.  Left Ear: Tympanic membrane and external ear normal.  Mouth/Throat: Oropharynx is clear and moist and mucous membranes are normal.  Right ear cerumen impaction. Left  TM seen partially due to excess cerumen  Eyes: Pupils are equal, round, and reactive to light. Conjunctivae and EOM are normal.  Neck: Normal range of motion. No tracheal deviation present. No thyromegaly present.  Cardiovascular: Regular rhythm. Tachycardia present.  No murmur heard. Pulses:      Dorsalis pedis pulses are 2+ on the right side and 2+ on the left side.  Respiratory: Effort normal and breath sounds normal. No respiratory distress.  GI: Soft. He exhibits no mass. There is no hepatomegaly. There is no abdominal tenderness.  Genitourinary:    Genitourinary Comments: Refused,no concerns.   Musculoskeletal:        General: No tenderness or edema.     Comments: No major deformities appreciated and no signs of synovitis.  Lymphadenopathy:    He has no cervical adenopathy.       Right: No supraclavicular adenopathy present.       Left: No supraclavicular adenopathy present.  Neurological: He is alert and oriented to person, place, and time. He has normal strength. No cranial nerve deficit or sensory deficit. Coordination and gait normal.  Reflex Scores:      Bicep reflexes are 2+ on the right side and 2+ on the left side.      Patellar reflexes are 2+ on the right side and 2+ on the left side. Skin: Skin is warm. No erythema.  Psychiatric: He has a  normal mood and affect. Cognition and memory are normal.  Well groomed, good eye contact.     ASSESSMENT AND PLAN:  Mr. Daelen was seen today for establish care and annual exam.  Diagnoses and all orders for this visit:  Orders Placed This Encounter  Procedures  . Pneumococcal polysaccharide vaccine 23-valent greater than or equal to 2yo subcutaneous/IM  . Microalbumin / creatinine urine ratio  . Comprehensive metabolic panel  . Lipid panel  . Hemoglobin A1c  . PSA(Must document that pt has been informed of limitations of PSA testing.)   Lab Results  Component Value Date   CHOL 141 11/03/2018   HDL 46.90 11/03/2018   LDLCALC 66 11/03/2018   TRIG 139.0 11/03/2018   CHOLHDL 3 11/03/2018   Lab Results  Component Value Date   HGBA1C 5.7 11/03/2018   Lab Results  Component Value Date   CREATININE 0.79 11/03/2018   BUN 12 11/03/2018   NA 140 11/03/2018   K 3.9 11/03/2018   CL 102 11/03/2018   CO2 30 11/03/2018   Lab Results  Component Value Date   MICROALBUR 1.0 11/03/2018   Lab Results  Component Value Date   PSA 0.29 11/03/2018   Lab Results  Component Value Date   ALT 31 11/03/2018   AST 24 11/03/2018   ALKPHOS 63 11/03/2018   BILITOT 0.6 11/03/2018    Routine general medical examination at a health care facility We discussed the importance of regular physical activity and healthy diet for prevention of chronic illness and/or complications. Preventive guidelines reviewed. Vaccination updated.  Next CPE in a year.  Type 2 diabetes mellitus with hyperglycemia, without long-term current use of insulin ([facility]) HgA1C has been at goal. No changes in current management, if HgA1C still < 6.0, we will consider decreasing Metformin dose. Regular exercise and healthy diet with avoidance of added sugar food intake is an important part of treatment and recommended. Annual eye exam (overdue), periodic dental and foot care recommended. F/U in 5-6 months  -      Microalbumin / creatinine urine ratio -  Comprehensive metabolic panel -     Hemoglobin A1c  Prostate cancer screening -     PSA(Must document that pt has been informed of limitations of PSA testing.)  Screening for lipoid disorders -     Lipid panel  Need for pneumococcal vaccination -     Pneumococcal polysaccharide vaccine 23-valent greater than or equal to 2yo subcutaneous/IM    Return in 6 months (on 05/06/2019) for DM II.     Saida Lonon G. [place], MD  [facility]. [place] office.

## 2018-11-05 ENCOUNTER — Other Ambulatory Visit: Payer: Self-pay | Admitting: Family Medicine

## 2018-11-05 DIAGNOSIS — E1165 Type 2 diabetes mellitus with hyperglycemia: Secondary | ICD-10-CM

## 2018-11-05 MED ORDER — METFORMIN HCL 1000 MG PO TABS: 1000.0000 mg | ORAL_TABLET | Freq: Two times a day (BID) | ORAL | 4 refills | Status: DC

## 2018-11-05 NOTE — Telephone Encounter (Signed)
REFILL metFORMIN (GLUCOPHAGE) 1000 MG tablet [Pharmacy Med Name: METFORMIN HCL 1,000 MG TABLET]  PHARM[facility]Y [id number] - [place], [place] - [address] [phone] (Phone) [phone] (Fax)   Patient states he has not had any meds since Sunday 7/5.

## 2018-11-05 NOTE — Telephone Encounter (Signed)
Refill sent.

## 2018-11-08 ENCOUNTER — Encounter: Payer: Self-pay | Admitting: Family Medicine

## 2018-11-08 MED ORDER — METFORMIN HCL 500 MG PO TABS: 500.0000 mg | ORAL_TABLET | Freq: Two times a day (BID) | ORAL | 3 refills | Status: DC

## 2019-02-27 ENCOUNTER — Ambulatory Visit: Payer: Self-pay | Admitting: Internal Medicine

## 2019-02-27 ENCOUNTER — Ambulatory Visit (INDEPENDENT_AMBULATORY_CARE_PROVIDER_SITE_OTHER): Payer: Self-pay | Admitting: Internal Medicine

## 2019-02-27 ENCOUNTER — Other Ambulatory Visit: Payer: Self-pay

## 2019-02-27 ENCOUNTER — Encounter: Payer: Self-pay | Admitting: Internal Medicine

## 2019-02-27 DIAGNOSIS — J45909 Unspecified asthma, uncomplicated: Secondary | ICD-10-CM

## 2019-02-27 DIAGNOSIS — Z23 Encounter for immunization: Secondary | ICD-10-CM

## 2019-02-27 DIAGNOSIS — H6123 Impacted cerumen, bilateral: Secondary | ICD-10-CM

## 2019-02-27 NOTE — Assessment & Plan Note (Addendum)
Onset 2014 with upper airway component  - PFT's 01/02/2013 wnl including fef 25-75 p am dulera 100 x 2 pffs  - FENO 09/13/2016  =   31 on symb 160 x 2 bid but has aodm so try symb 80 2bid  - Allergy profile 01/23/2018 >  Eos 0.1 /  IgE  188 RAST neg  08/28/2018  After extensive coaching inhaler device,  effectiveness =    75%   As long as has access to singulari/symb 80 >> All goals of chronic asthma control met including optimal function and elimination of symptoms with minimal need for rescue therapy.  Contingencies discussed in full including contacting this office immediately if not controlling the symptoms using the rule of two's.     Will refer for AZ&Me samples    >>> f/u q 6 weeks, sooner if needed

## 2019-02-27 NOTE — Patient Instructions (Signed)
No change in medications  We can start the paperwork for [place] for symbicort samples   Use your ear wax kit and if not better see Dr Benjamine Mola since Dr Ernesto Rutherford has retired

## 2019-02-27 NOTE — Assessment & Plan Note (Signed)
Noted 03/26/2014 > try otc's first - recurrent 02/27/2019 > otc's then f/u with Benjamine Mola as Ernesto Rutherford has retired

## 2019-02-27 NOTE — Progress Notes (Signed)
Subjective:     Patient ID: Mark Ward, male   DOB: 11/26/1965   MRN: [id number]  Brief patient profile:  17 yobm never smoker never significant allergies or asthma with new onset cough Feb 2014 variably improved then relapsed seen by ent x 3 by UC x3 2, x ER x 3 and finally admitted:   Admit date: 11/24/2012  Discharge date: 11/26/2012  Discharge Diagnoses:   Moderate persistent asthma with exacerbation     07/09/2013 NP  Folllow up and Med review  Returns for follow up and med review .  Reports breathing is doing well since last ov.  feels symptoms are more related to molds at work No SABA use.  Insurance will cover [employer].  Reviewed all his meds and organized them into a med calendar closely w/ pt education . Is following with ENT > sinus surgery 08/04/13 [name]  rec Follow med calendar closely and bring to each visit. Continue on current regimen. Make sure to brush rinse and gargle after inhaler use. Continue followup with ENT    12/12/2015  [name]  Chief Complaint  Patient presents with  . Follow-up    pt states he is doing well, notes minor sinus congestion worse at work.    Very rare saba use / main issues are nasal and f/u [name]  rec Plan A = Automatic = symbicort 160 Take 2 puffs first thing in am and then another 2 puffs about 12 hours later and singulair each am  Plan B = Backup Only use your albuterol as a rescue medication  Please schedule a follow up visit in 3 months but call sooner if needed  Use med calendar     06/24/2017  f/u ov/Mark Ward re: acute asthma exac  Chief Complaint  Patient presents with  . Acute Visit    productive cough,yellow sputum,tried tylenol cold,apple cider vinager,throat has some soreness,chest congestion   prior 1st week in Feb 2019 on rare  saba maint on symb 80 and doing fine with ex/ sleep Then gradually worse cough/ sob with onset  nasal congestion >no rx Dyspnea:   Now sometimes at rest, better p saba but ran out "I guess I used too much" about a week agao  Cough: worse x one week/  ? Worse p supper and better in bed and sleeping flat fine rec Prednisone 10 mg take  4 each am x 2 days,   2 each am x 2 days,  1 each am x 2 days and stop  Augmentin 875 mg take one pill twice daily  X 10 days -  Plan A = Automatic = symbicort 80 Take 2 puffs first thing in am and then another 2 puffs about 12 hours later.  Work on Education officer, community B = Backup Only use your albuterol as a rescue medication Please schedule a follow up visit in 3 months but call sooner if needed to see [name] for new med calendar so bring all meds with you to this visit            05/30/2018  Acute extended ov/Mark Ward re: asthma  With flare of cough ? Sinusitis  Chief Complaint  Patient presents with  . Acute Visit    Pt c/o cough, sore throat and nasal d/c x 5 days. He is coughing up bright yellow sputum and has bright yellow nasal d/c. He is using his albuterol inhaler daily.   Dyspnea:  Not  limited by breathing from desired activities   Cough: x 5 days with nasal d/c green/ s/p dental surgery 05/12/18  Sleeping: since onset worse due to nasal congestioon  SABA use: rarely unless out of symbicort rec Prednisone 10 mg take  4 each am x 2 days,   2 each am x 2 days,  1 each am x 2 days and stop Augmentin 875 mg take one pill twice daily  X 10 days Continue symbicort 80 Take 2 puffs first thing in am and then another 2 puffs about 12 hours later   08/28/2018  f/u ov/Mark Ward re: asthma / does fine as long as maint on symbicort 80/ singulair  Chief Complaint  Patient presents with  . Follow-up    3 mth f/u, doing well, needs refill on albuterol inhaler   Dyspnea:  Not limited by breathing from desired activities / pushes mower x 1.5 h  Cough: daytime time / min productive not noct Sleeping: 2 pillows / bed is flat SABA use: ran out early  because used it all up p symbicort ran out  first 02: none  Plan A = Automatic = symbicort 80 Take 2 puffs first thing in am and then another 2 puffs about 12 hours later.  Work on inhaler technique:  Plan B = Backup Only use your albuterol inhaler as a rescue medication If get worse >  Prednisone 10 mg take  4 each am x 2 days,   2 each am x 2 days,  1 each am x 2 days and stop  Please schedule a follow up visit in 6 months but call sooner if needed  with all medications /inhalers/ solutions in hand so we can verify exactly what you are taking. 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 Follow-up    Cough has improved some. He is c/o right ear being stopped up. He is using his albuterol about 2 x per wk on average.   Dyspnea: Not limited by breathing from desired activities  Including push mower x 40 min  Cough: minimal sporadic day > noct, minimal mucoid production  Sleeping: ok  SABA use: as above  02: none    No obvious day to day or daytime variability or assoc excess/ purulent sputum or mucus plugs or hemoptysis or cp or chest tightness, subjective wheeze or overt sinus or hb symptoms.   Sleeping  without nocturnal  or early am exacerbation  of respiratory  c/o's or need for noct saba. Also denies any obvious fluctuation of symptoms with weather or environmental changes or other aggravating or alleviating factors except as outlined above   No unusual exposure hx or h/o childhood pna/ asthma or knowledge of premature birth.  Current Allergies, Complete Past Medical History, Past Surgical History, Family History, and Social History were reviewed in [employer] record.  ROS  The following are not active complaints unless bolded Hoarseness, sore throat, dysphagia, dental problems, itching, sneezing,  nasal congestion or discharge of excess mucus or purulent  secretions, ear ache,   fever, chills, sweats, unintended wt loss or wt gain, classically pleuritic or exertional cp,  orthopnea pnd or arm/hand swelling  or leg swelling, presyncope, palpitations, abdominal pain, anorexia, nausea, vomiting, diarrhea  or change in bowel habits or change in bladder habits, change in stools  or change in urine, dysuria, hematuria,  rash, arthralgias, visual complaints, headache, numbness, weakness or ataxia or problems with walking or coordination,  change in mood or  memory.        Current Meds  Medication Sig  . acetaminophen (TYLENOL) 500 MG tablet Take 500 mg by mouth every 6 (six) hours as needed.  [name] albuterol (PROAIR HFA) 108 (90 Base) MCG/ACT inhaler 2 puffs every 4 hours if needed  . Aloe-Sodium Chloride (AYR SALINE NASAL GEL NA) As needed  . atorvastatin (LIPITOR) 10 MG tablet Take 1 tablet (10 mg total) by mouth daily.  . Bilberry, Vaccinium myrtillus, (BILBERRY PO) Take 1 tablet by mouth daily.  . budesonide-formoterol (SYMBICORT) 80-4.5 MCG/ACT inhaler Inhale 2 puffs into the lungs 2 (two) times daily.  . cholecalciferol (VITAMIN D) 400 units TABS tablet Take 2,000 Units by mouth daily.   . fluticasone (FLONASE) 50 MCG/ACT nasal spray Place 1 spray into both nostrils 2 (two) times daily as needed for allergies or rhinitis.  [name] glucose blood (ONETOUCH VERIO) test strip Use as instructed  . guaifenesin (HUMIBID E) 400 MG TABS tablet Take 400 mg by mouth every 6 (six) hours as needed.  [name] guaifenesin (ROBITUSSIN) 100 MG/5ML syrup Take 200 mg by mouth 3 (three) times daily as needed for cough.  . Insulin Pen Needle 31G X 5 MM MISC Pt is to check blood sugar before breakfast and before evening meal  . Iodine, Kelp, (KELP PO) Take 1 tablet by mouth daily.  . Lancets (ONETOUCH ULTRASOFT) lancets Use as instructed  . LYSINE PO Take 1 capsule by mouth daily.  [name] MAGNESIUM PO Take 1 tablet by mouth daily.  . metFORMIN (GLUCOPHAGE) 500 MG tablet Take 1 tablet (500 mg  total) by mouth 2 (two) times daily with a meal.  . montelukast (SINGULAIR) 10 MG tablet One daily  . Multiple Vitamins-Minerals (CENTRUM) tablet Take 1 tablet by mouth daily.  . Multiple Vitamins-Minerals (ZINC PO) Take 1 tablet by mouth daily.  [name] OVER THE COUNTER MEDICATION Vitamin E, One capsule daily.  [name] oxymetazoline (AFRIN) 0.05 % nasal spray 2 puffs twice daily x 5 days, off for 2 wks.  . Probiotic Product (PROBIOTIC PO) Take 1 capsule by mouth daily.  . Saline GEL as needed.  . sodium chloride (OCEAN) 0.65 % SOLN nasal spray Place 1 spray into both nostrils as needed for congestion.  . Turmeric Curcumin 500 MG CAPS Take 500 mg by mouth 3 (three) times a week.  [name] UNABLE TO FIND Med Name: Loviral 4-5 x per day  . vitamin C (ASCORBIC ACID) 500 MG tablet Take 500 mg by mouth daily.            Objective:   Physical Exam    02/27/2019   197 01/02/2013  212  > 02/03/2013  215 > 05/12/2013 211 > 06/24/2013  212>210 07/09/13 > 12/30/2013  213 > 01/21/2014  208 >208 02/05/2014 > 03/26/2014 211 > 05/25/2014 216>211 06/22/2014 > 212 03/18/2015 >  09/09/2015   215 >   212 12/12/2015 > 03/15/2016  212 > 05/25/2016   191 > 09/13/2016  182 > 06/24/2017   173 >  05/30/2018    166 > 08/28/2018  176  Vital signs reviewed - Note on arrival 02 sats  100% on RA    HEENT : pt wearing mask not removed for exam due to covid -19 concerns.  Both ears full blocked by copious wax    NECK :  without  JVD/Nodes/TM/ nl carotid upstrokes bilaterally   LUNGS: no acc muscle use,  Nl contour chest which is clear to A and P bilaterally without cough on insp or exp maneuvers   CV:  RRR  no s3 or murmur or increase in P2, and no edema   ABD:  Obese soft and nontender with nl inspiratory excursion in the supine position. No bruits or organomegaly appreciated, bowel sounds nl  MS:  Nl gait/ ext warm without deformities, calf tenderness, cyanosis or clubbing No obvious joint restrictions   SKIN: warm and dry without lesions     NEURO:  alert, approp, nl sensorium with  no motor or cerebellar deficits apparent.                   Assessment:

## 2019-03-23 ENCOUNTER — Other Ambulatory Visit: Payer: Self-pay | Admitting: Internal Medicine

## 2019-03-23 MED ORDER — BUDESONIDE-FORMOTEROL FUMARATE 80-4.5 MCG/ACT IN AERO: 2.0000 | INHALATION_SPRAY | Freq: Two times a day (BID) | RESPIRATORY_TRACT | 3 refills | Status: DC

## 2019-03-27 ENCOUNTER — Other Ambulatory Visit: Payer: Self-pay | Admitting: Family Medicine

## 2019-05-06 ENCOUNTER — Encounter: Payer: Self-pay | Admitting: Family Medicine

## 2019-05-06 ENCOUNTER — Other Ambulatory Visit: Payer: Self-pay

## 2019-05-06 ENCOUNTER — Ambulatory Visit (INDEPENDENT_AMBULATORY_CARE_PROVIDER_SITE_OTHER): Payer: Self-pay | Admitting: Family Medicine

## 2019-05-06 VITALS — BP 126/70 | HR 96 | Temp 97.0°F | Resp 16 | Ht 66.5 in | Wt 203.0 lb

## 2019-05-06 DIAGNOSIS — E6609 Other obesity due to excess calories: Secondary | ICD-10-CM

## 2019-05-06 DIAGNOSIS — E1165 Type 2 diabetes mellitus with hyperglycemia: Secondary | ICD-10-CM

## 2019-05-06 DIAGNOSIS — Z6832 Body mass index (BMI) 32.0-32.9, adult: Secondary | ICD-10-CM

## 2019-05-06 LAB — POCT GLYCOSYLATED HEMOGLOBIN (HGB A1C): Hemoglobin A1C: 5.9 % — AB (ref 4.0–5.6)

## 2019-05-06 NOTE — Progress Notes (Signed)
HPI:   Mark Ward is a 54 y.o. male, who is here today for 6 months follow up.   He was last seen on 11/03/18. Last HgA1C was 5.7 on 11/03/18. Dose of Metformin was decreased from 1000 mg bid to 500 mg bid.  FG low 100's, , max 115. No hypoglycemic events.   He is still taking Metformin at 1000 mg twice daily, pharmacy kept filling same dose.  Denies abdominal pain, nausea,vomiting, polydipsia,polyuria, or polyphagia.   Review of Systems  Constitutional: Negative for activity change, appetite change, fatigue and fever.  HENT: Negative for mouth sores, nosebleeds and sore throat.   Eyes: Negative for redness and visual disturbance.  Respiratory: Negative for cough, shortness of breath and wheezing.   Cardiovascular: Negative for chest pain, palpitations and leg swelling.  Genitourinary: Negative for decreased urine volume, dysuria and hematuria.  Neurological: Negative for dizziness, weakness, numbness and headaches.  Rest of ROS, see pertinent positives sand negatives in HPI   Current Outpatient Medications on File Prior to Visit  Medication Sig Dispense Refill  . acetaminophen (TYLENOL) 500 MG tablet Take 500 mg by mouth every 6 (six) hours as needed.    [name] albuterol (PROAIR HFA) 108 (90 Base) MCG/ACT inhaler 2 puffs every 4 hours if needed 1 Inhaler 1  . Aloe-Sodium Chloride (AYR SALINE NASAL GEL NA) As needed    . atorvastatin (LIPITOR) 10 MG tablet TAKE 1 TABLET BY MOUTH EVERY DAY 90 tablet 3  . Bilberry, Vaccinium myrtillus, (BILBERRY PO) Take 1 tablet by mouth daily.    . budesonide-formoterol (SYMBICORT) 80-4.5 MCG/ACT inhaler Inhale 2 puffs into the lungs 2 (two) times daily. 3 Inhaler 3  . cholecalciferol (VITAMIN D) 400 units TABS tablet Take 2,000 Units by mouth daily.     . fluticasone (FLONASE) 50 MCG/ACT nasal spray Place 1 spray into both nostrils 2 (two) times daily as needed for allergies or rhinitis.    [name] glucose blood (ONETOUCH VERIO) test  strip Use as instructed 100 each 12  . guaifenesin (HUMIBID E) 400 MG TABS tablet Take 400 mg by mouth every 6 (six) hours as needed.    [name] guaifenesin (ROBITUSSIN) 100 MG/5ML syrup Take 200 mg by mouth 3 (three) times daily as needed for cough.    . Insulin Pen Needle 31G X 5 MM MISC Pt is to check blood sugar before breakfast and before evening meal 100 each 3  . Iodine, Kelp, (KELP PO) Take 1 tablet by mouth daily.    . Lancets (ONETOUCH ULTRASOFT) lancets Use as instructed 100 each 12  . LYSINE PO Take 1 capsule by mouth daily.    [name] MAGNESIUM PO Take 1 tablet by mouth daily.    . metFORMIN (GLUCOPHAGE) 1000 MG tablet Take 1,000 mg by mouth 2 (two) times daily with a meal.    . montelukast (SINGULAIR) 10 MG tablet One daily 90 tablet 3  . Multiple Vitamins-Minerals (CENTRUM) tablet Take 1 tablet by mouth daily.    . Multiple Vitamins-Minerals (ZINC PO) Take 1 tablet by mouth daily.    [name] OVER THE COUNTER MEDICATION Vitamin E, One capsule daily.    [name] oxymetazoline (AFRIN) 0.05 % nasal spray 2 puffs twice daily x 5 days, off for 2 wks.    . Probiotic Product (PROBIOTIC PO) Take 1 capsule by mouth daily.    . Saline GEL as needed.    . sodium chloride (OCEAN) 0.65 % SOLN nasal spray Place 1 spray  into both nostrils as needed for congestion.    . Turmeric Curcumin 500 MG CAPS Take 500 mg by mouth 3 (three) times a week.    [name] UNABLE TO FIND Med Name: Loviral 4-5 x per day    . vitamin C (ASCORBIC ACID) 500 MG tablet Take 500 mg by mouth daily.     No current facility-administered medications on file prior to visit.     Past Medical History:  Diagnosis Date  . Allergy   . Bronchitis   . Chronic sinusitis   . Diabetes mellitus without complication ([place])   . Migraines    No Known Allergies  Social History   Socioeconomic History  . Marital status: Single    Spouse name: Not on file  . Number of children: Not on file  . Years of education: Not on file  . Highest education level: Not  on file  Occupational History  . Occupation: Research scientist (physical sciences): [employer]  Tobacco Use  . Smoking status: Never Smoker  . Smokeless tobacco: Never Used  Substance and Sexual Activity  . Alcohol use: No  . Drug use: No  . Sexual activity: Not Currently  Other Topics Concern  . Not on file  Social History Narrative  . Not on file   Social Determinants of Health   Financial Resource Strain:   . Difficulty of Paying Living Expenses: Not on file  Food Insecurity:   . Worried About Charity fundraiser in the Last Year: Not on file  . Ran Out of Food in the Last Year: Not on file  Transportation Needs:   . Lack of Transportation (Medical): Not on file  . Lack of Transportation (Non-Medical): Not on file  Physical Activity:   . Days of Exercise per Week: Not on file  . Minutes of Exercise per Session: Not on file  Stress:   . Feeling of Stress : Not on file  Social Connections:   . Frequency of Communication with Friends and Family: Not on file  . Frequency of Social Gatherings with Friends and Family: Not on file  . Attends Religious Services: Not on file  . Active Member of Clubs or Organizations: Not on file  . Attends Archivist Meetings: Not on file  . Marital Status: Not on file    Vitals:   05/06/19 0953  BP: 126/70  Pulse: 96  Resp: 16  Temp: (!) 97 F (36.1 C)  SpO2: 96%   Wt Readings from Last 3 Encounters:  05/06/19 203 lb (92.1 kg)  02/27/19 197 lb (89.4 kg)  11/03/18 190 lb 2 oz (86.2 kg)    Body mass index is 32.27 kg/m.   Physical Exam  Nursing note and vitals reviewed. Constitutional: He is oriented to person, place, and time. He appears well-developed. No distress.  HENT:  Head: Normocephalic and atraumatic.  Mouth/Throat: Oropharynx is clear and moist and mucous membranes are normal.  Eyes: Pupils are equal, round, and reactive to light. Conjunctivae are normal.  Cardiovascular: Normal rate and regular rhythm.  No murmur  heard. Pulses:      Dorsalis pedis pulses are 2+ on the right side and 2+ on the left side.  Respiratory: Effort normal and breath sounds normal. No respiratory distress.  GI: Soft. He exhibits no mass. There is no hepatomegaly. There is no abdominal tenderness.  Musculoskeletal:        General: No edema.  Lymphadenopathy:    He has no cervical  adenopathy.  Neurological: He is alert and oriented to person, place, and time. He has normal strength. No cranial nerve deficit. Gait normal.  Skin: Skin is warm. No rash noted. No erythema.  Psychiatric: He has a normal mood and affect.  Well groomed, good eye contact.     ASSESSMENT AND PLAN:   Mark Ward was seen today for 6 months follow-up.  Orders Placed This Encounter  Procedures  . POC HgB A1c   Lab Results  Component Value Date   HGBA1C 5.9 (A) 05/06/2019    1. Type 2 diabetes mellitus with hyperglycemia, without long-term current use of insulin ([place]) HgA1C still at goal. No changes in current management. Regular exercise and healthy diet with avoidance of added sugar food intake is an important part of treatment and recommended. Annual eye exam, periodic dental and foot care recommended. F/U in 5-6 months  2. Class 1 obesity due to excess calories with serious comorbidity and body mass index (BMI) of 32.0 to 32.9 in adult Gained about 13 Lb since his last visit here, 11/03/18. We discussed benefits of wt loss as well as adverse effects of obesity. Consistency with healthy diet and physical activity recommended.    Return in about 7 months (around 11/26/2019) for cpe.   Justice Aguirre G. Martin[place], MD  [facility]. [place] office.

## 2019-05-06 NOTE — Patient Instructions (Addendum)
A few things to remember from today's visit:   Type 2 diabetes mellitus with hyperglycemia, without long-term current use of insulin ([place]) - Plan: POC HgB A1c  Diabetes is still well controlled. Continue the dose of Metformin you are currently taking. Remember to schedule your eye exam.  Please be sure medication list is accurate. If a new problem present, please set up appointment sooner than planned today.

## 2019-07-16 ENCOUNTER — Ambulatory Visit: Payer: Self-pay | Attending: Internal Medicine

## 2019-07-16 DIAGNOSIS — Z23 Encounter for immunization: Secondary | ICD-10-CM

## 2019-07-16 NOTE — Progress Notes (Signed)
   Covid-19 Vaccination Clinic  Name:  Mark Ward    MRN: [id number] DOB: [birth year]  07/16/2019  [name] was observed post Covid-19 immunization for 15 minutes without incident. He was provided with Vaccine Information Sheet and instruction to access the V-Safe system.   [name] was instructed to call 911 with any severe reactions post vaccine: [name] Difficulty breathing  . Swelling of face and throat  . A fast heartbeat  . A bad rash all over body  . Dizziness and weakness   Immunizations Administered    Name Date Dose VIS Date Route   Pfizer COVID-19 Vaccine 07/16/2019 10:38 AM 0.3 mL 04/10/2019 Intramuscular   Manufacturer: [place]   Lot: [id number]   [place]: [id number]

## 2019-07-22 ENCOUNTER — Encounter: Payer: Self-pay | Admitting: Acute Care

## 2019-07-22 ENCOUNTER — Telehealth: Payer: Self-pay | Admitting: Internal Medicine

## 2019-07-22 ENCOUNTER — Ambulatory Visit (INDEPENDENT_AMBULATORY_CARE_PROVIDER_SITE_OTHER): Payer: Self-pay | Admitting: Acute Care

## 2019-07-22 DIAGNOSIS — J329 Chronic sinusitis, unspecified: Secondary | ICD-10-CM

## 2019-07-22 MED ORDER — AMOXICILLIN-POT CLAVULANATE 875-125 MG PO TABS: 1.0000 | ORAL_TABLET | Freq: Two times a day (BID) | ORAL | 0 refills | Status: DC

## 2019-07-22 NOTE — Telephone Encounter (Signed)
Left message for patient to call back  

## 2019-07-22 NOTE — Progress Notes (Signed)
Virtual Visit via Telephone Note  I connected with Mark Ward on 07/22/19 at  4:00 PM EDT by telephone and verified that I am speaking with the correct person using two identifiers.  Location: Patient: At home Provider:Working remotely from home ( Practice driven)   I discussed the limitations, risks, security and privacy concerns of performing an evaluation and management service by telephone and the availability of in person appointments. I also discussed with the patient that there may be a patient responsible charge related to this service. The patient expressed understanding and agreed to proceed.  54 year old never smoker , without significant allergies with intrinsic asthma  Followed  By Dr. Melvyn Novas   History of Present Illness: Pt presents for an acute visit. He has had this for at least one week. He has some discomfort under his eyes.  His secretions are bright yellow. There is no bloody discharge. He also has a persistent cough. He has used his rescue inhaler once or twice this week. He does not have wheezing. He feels his cough is due to post nasal gtt from his sinuses.   Observations/Objective:  PFT's 01/02/2013 wnl including fef 25-75 p am dulera 100 x 2 pffs  - FENO 09/13/2016  =   29 on symb 160 x 2 bid but has aodm so try symb 80 2bid  - Allergy profile 01/23/2018 >  Eos 0.1 /  IgE  188 RAST neg  08/28/2018  After extensive coaching inhaler device,  effectiveness =    75%  Assessment and Plan: Sinus infection Plan We will send in a prescription for Augmentin 875 mg take one pill twice daily  X 10 days. Take until gone Take with your probiotics Call the office if you get worse not better Continue symbicort 80 Take 2 puffs first thing in am and then another 2 puffs about 12 hours later Rinse mouth after use. Continue Singulair daily Continue Allegra daily Follow up with Dr. Melvyn Novas 08/28/2019 as is scheduled   Follow Up Instructions: Follow up with Dr. Melvyn Novas 08/28/2019  at 9 am as is scheduled Second Covid Vaccine appointment 08/10/2019   I discussed the assessment and treatment plan with the patient. The patient was provided an opportunity to ask questions and all were answered. The patient agreed with the plan and demonstrated an understanding of the instructions.   The patient was advised to call back or seek an in-person evaluation if the symptoms worsen or if the condition fails to improve as anticipated.  I provided 23 minutes of non-face-to-face time during this encounter.   [name], NP  07/22/2019

## 2019-07-22 NOTE — Telephone Encounter (Signed)
Pt called back--will reschedule televisit for today

## 2019-07-22 NOTE — Patient Instructions (Signed)
We will send in a prescription for Augmentin 875 mg take one pill twice daily  X 10 days. Take until gone Take with your probiotics Call the office if you get worse not better Continue symbicort 80 Take 2 puffs first thing in am and then another 2 puffs about 12 hours later Rinse mouth after use. Continue Singulair daily Continue Allegra daily Follow up with Dr. Melvyn Novas 08/28/2019 as is scheduled Second Covid Vaccine appointment 08/10/2019 >> congrats on getting your first Covid vaccine Please contact office for sooner follow up if symptoms do not improve or worsen or seek emergency care  Please let us know if there is anything else you need.

## 2019-07-23 ENCOUNTER — Ambulatory Visit: Payer: Self-pay | Admitting: Adult Health

## 2019-08-10 ENCOUNTER — Ambulatory Visit: Payer: Self-pay | Attending: Internal Medicine

## 2019-08-10 DIAGNOSIS — Z23 Encounter for immunization: Secondary | ICD-10-CM

## 2019-08-10 NOTE — Progress Notes (Signed)
   Covid-19 Vaccination Clinic  Name:  Mark Ward    MRN: [id number] DOB: May 06, 1965  08/10/2019  [name] was observed post Covid-19 immunization for 15 minutes without incident. He was provided with Vaccine Information Sheet and instruction to access the V-Safe system.   [name] was instructed to call 911 with any severe reactions post vaccine: [name] Difficulty breathing  . Swelling of face and throat  . A fast heartbeat  . A bad rash all over body  . Dizziness and weakness   Immunizations Administered    Name Date Dose VIS Date Route   Pfizer COVID-19 Vaccine 08/10/2019  9:56 AM 0.3 mL 04/10/2019 Intramuscular   Manufacturer: [place]   Lot: [id number]   [place]: [id number]

## 2019-08-28 ENCOUNTER — Other Ambulatory Visit: Payer: Self-pay

## 2019-08-28 ENCOUNTER — Encounter: Payer: Self-pay | Admitting: Internal Medicine

## 2019-08-28 ENCOUNTER — Ambulatory Visit (INDEPENDENT_AMBULATORY_CARE_PROVIDER_SITE_OTHER): Payer: Self-pay | Admitting: Internal Medicine

## 2019-08-28 DIAGNOSIS — J310 Chronic rhinitis: Secondary | ICD-10-CM

## 2019-08-28 DIAGNOSIS — J45909 Unspecified asthma, uncomplicated: Secondary | ICD-10-CM

## 2019-08-28 MED ORDER — PREDNISONE 10 MG PO TABS: ORAL_TABLET | ORAL | 0 refills | Status: DC

## 2019-08-28 NOTE — Progress Notes (Signed)
Subjective:     Patient ID: Mark Ward, male   DOB: 11/26/1965   MRN: [id number]  Brief patient profile:  17 yobm never smoker never significant allergies or asthma with new onset cough Feb 2014 variably improved then relapsed seen by ent x 3 by UC x3 2, x ER x 3 and finally admitted:   Admit date: 11/24/2012  Discharge date: 11/26/2012  Discharge Diagnoses:   Moderate persistent asthma with exacerbation     07/09/2013 NP  Folllow up and Med review  Returns for follow up and med review .  Reports breathing is doing well since last ov.  feels symptoms are more related to molds at work No SABA use.  Insurance will cover [employer].  Reviewed all his meds and organized them into a med calendar closely w/ pt education . Is following with ENT > sinus surgery 08/04/13 Mark Ward  rec Follow med calendar closely and bring to each visit. Continue on current regimen. Make sure to brush rinse and gargle after inhaler use. Continue followup with ENT    12/12/2015  [name]  Chief Complaint  Patient presents with  . Follow-up    pt states he is doing well, notes minor sinus congestion worse at work.    Very rare saba use / main issues are nasal and f/u Mark Ward  rec Plan A = Automatic = symbicort 160 Take 2 puffs first thing in am and then another 2 puffs about 12 hours later and singulair each am  Plan B = Backup Only use your albuterol as a rescue medication  Please schedule a follow up visit in 3 months but call sooner if needed  Use med calendar     06/24/2017  f/u ov/Mark Ward re: acute asthma exac  Chief Complaint  Patient presents with  . Acute Visit    productive cough,yellow sputum,tried tylenol cold,apple cider vinager,throat has some soreness,chest congestion   prior 1st week in Feb 2019 on rare  saba maint on symb 80 and doing fine with ex/ sleep Then gradually worse cough/ sob with onset  nasal congestion >no rx Dyspnea:   Now sometimes at rest, better p saba but ran out "I guess I used too much" about a week agao  Cough: worse x one week/  ? Worse p supper and better in bed and sleeping flat fine rec Prednisone 10 mg take  4 each am x 2 days,   2 each am x 2 days,  1 each am x 2 days and stop  Augmentin 875 mg take one pill twice daily  X 10 days -  Plan A = Automatic = symbicort 80 Take 2 puffs first thing in am and then another 2 puffs about 12 hours later.  Work on Education officer, community B = Backup Only use your albuterol as a rescue medication Please schedule a follow up visit in 3 months but call sooner if needed to see Mark Ward for new med calendar so bring all meds with you to this visit            05/30/2018  Acute extended ov/Mark Ward re: asthma  With flare of cough ? Sinusitis  Chief Complaint  Patient presents with  . Acute Visit    Pt c/o cough, sore throat and nasal d/c x 5 days. He is coughing up bright yellow sputum and has bright yellow nasal d/c. He is using his albuterol inhaler daily.   Dyspnea:  Not  limited by breathing from desired activities   Cough: x 5 days with nasal d/c green/ s/p dental surgery 05/12/18  Sleeping: since onset worse due to nasal congestioon  SABA use: rarely unless out of symbicort rec Prednisone 10 mg take  4 each am x 2 days,   2 each am x 2 days,  1 each am x 2 days and stop Augmentin 875 mg take one pill twice daily  X 10 days Continue symbicort 80 Take 2 puffs first thing in am and then another 2 puffs about 12 hours later   08/28/2018  f/u ov/Mark Ward re: asthma / does fine as long as maint on symbicort 80/ singulair  Chief Complaint  Patient presents with  . Follow-up    3 mth f/u, doing well, needs refill on albuterol inhaler   Dyspnea:  Not limited by breathing from desired activities / pushes mower x 1.5 h  Cough: daytime time / min productive not noct Sleeping: 2 pillows / bed is flat SABA use: ran out early  because used it all up p symbicort ran out  first 02: none  Plan A = Automatic = symbicort 80 Take 2 puffs first thing in am and then another 2 puffs about 12 hours later.  Work on inhaler technique:  Plan B = Backup Only use your albuterol inhaler as a rescue medication If get worse >  Prednisone 10 mg take  4 each am x 2 days,   2 each am x 2 days,  1 each am x 2 days and stop  Please schedule a follow up visit in 6 months but call sooner if needed  with all medications /inhalers/ solutions in hand so we can verify exactly what you are taking. 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 Follow-up    Cough has improved some. He is c/o right ear being stopped up. He is using his albuterol about 2 x per wk on average.   Dyspnea: Not limited by breathing from desired activities  Including push mower x 40 min  Cough: minimal sporadic day > noct, minimal mucoid production  Sleeping: ok  SABA use: as above  rec No change in medications We can start the paperwork for [place] for symbicort samples  Use your ear wax kit and if not better see Dr Benjamine Ward since Dr Mark Ward has retired    07/22/19 televisit NP flare since mid march cough We will send in a prescription for Augmentin 875 mg take one pill twice daily  X 10 days. Take until gone Take with your probiotics Call the office if you get worse not better Continue symbicort 80 Take 2 puffs first thing in am and then another 2 puffs about 12 hours later Rinse mouth after use. Continue Singulair daily Continue Allegra daily    08/28/2019  f/u ov/Mark Ward re: asthma on symb 80 2bid - worse cough  mid march 2021  Second covid shot April 12th Chief Complaint  Patient presents with  . Follow-up   Dyspnea:  Not limited  Cough: minimal when head hits pillow  Sleeping: fine flat  SABA use: not now  02: not  Not using allegra as rec    No obvious day to  day or daytime variability or assoc excess/ purulent sputum or mucus plugs or hemoptysis or cp or  chest tightness, subjective wheeze or overt sinus or hb symptoms.   Sleeping without nocturnal  or early am exacerbation  of respiratory  c/o's or need for noct saba. Also denies any obvious fluctuation of symptoms with weather or environmental changes or other aggravating or alleviating factors except as outlined above   No unusual exposure hx or h/o childhood pna/ asthma or knowledge of premature birth.  Current Allergies, Complete Past Medical History, Past Surgical History, Family History, and Social History were reviewed in [employer] record.  ROS  The following are not active complaints unless bolded Hoarseness, sore throat, dysphagia, dental problems, itching, sneezing,  nasal congestion or discharge of excess mucus or purulent secretions, ear ache,   fever, chills, sweats, unintended wt loss or wt gain, classically pleuritic or exertional cp,  orthopnea pnd or arm/hand swelling  or leg swelling, presyncope, palpitations, abdominal pain, anorexia, nausea, vomiting, diarrhea  or change in bowel habits or change in bladder habits, change in stools or change in urine, dysuria, hematuria,  rash, arthralgias, visual complaints, headache, numbness, weakness or ataxia or problems with walking or coordination,  change in mood or  memory.        Current Meds  Medication Sig  . acetaminophen (TYLENOL) 500 MG tablet Take 500 mg by mouth every 6 (six) hours as needed.  [name] albuterol (PROAIR HFA) 108 (90 Base) MCG/ACT inhaler 2 puffs every 4 hours if needed  . Aloe-Sodium Chloride (AYR SALINE NASAL GEL NA) As needed  . atorvastatin (LIPITOR) 10 MG tablet TAKE 1 TABLET BY MOUTH EVERY DAY  . budesonide-formoterol (SYMBICORT) 80-4.5 MCG/ACT inhaler Inhale 2 puffs into the lungs 2 (two) times daily.  . cholecalciferol (VITAMIN D) 400 units TABS tablet Take 2,000 Units by mouth daily.   [name]  glucose blood (ONETOUCH VERIO) test strip Use as instructed  . guaifenesin (HUMIBID E) 400 MG TABS tablet Take 400 mg by mouth every 6 (six) hours as needed.  [name] guaifenesin (ROBITUSSIN) 100 MG/5ML syrup Take 200 mg by mouth 3 (three) times daily as needed for cough.  . Insulin Pen Needle 31G X 5 MM MISC Pt is to check blood sugar before breakfast and before evening meal  . Iodine, Kelp, (KELP PO) Take 1 tablet by mouth daily.  . Lancets (ONETOUCH ULTRASOFT) lancets Use as instructed  . LYSINE PO Take 1 capsule by mouth daily.  . metFORMIN (GLUCOPHAGE) 1000 MG tablet Take 1,000 mg by mouth 2 (two) times daily with a meal.  . montelukast (SINGULAIR) 10 MG tablet One daily  . Multiple Vitamins-Minerals (ZINC PO) Take 1 tablet by mouth daily.  [name] oxymetazoline (AFRIN) 0.05 % nasal spray 2 puffs twice daily x 5 days, off for 2 wks.  . Probiotic Product (PROBIOTIC PO) Take 1 capsule by mouth daily.  . Saline GEL as needed.  . sodium chloride (OCEAN) 0.65 % SOLN nasal spray Place 1 spray into both nostrils as needed for congestion.  [name] UNABLE TO FIND Med Name: Loviral 4-5 x per day  . [DISCONTINUED] amoxicillin-clavulanate (AUGMENTIN) 875-125 MG tablet Take 1 tablet by mouth 2 (two) times daily.  . [DISCONTINUED] Bilberry, Vaccinium myrtillus, (BILBERRY PO) Take 1 tablet by mouth daily.  . [DISCONTINUED] fluticasone (FLONASE) 50 MCG/ACT nasal spray Place 1 spray into both nostrils 2 (two) times daily as needed for allergies or rhinitis.  . [DISCONTINUED] MAGNESIUM PO Take 1 tablet by mouth daily.  . [DISCONTINUED] Multiple Vitamins-Minerals (CENTRUM) tablet Take 1 tablet by mouth daily.  . [  DISCONTINUED] OVER THE COUNTER MEDICATION Vitamin E, One capsule daily.  . [DISCONTINUED] Turmeric Curcumin 500 MG CAPS Take 500 mg by mouth 3 (three) times a week.  . [DISCONTINUED] vitamin C (ASCORBIC ACID) 500 MG tablet Take 500 mg by mouth daily.             Objective:   Physical Exam  amb bm with  prominent pseudowheeze    08/28/2019     192  02/27/2019   197 01/02/2013  212  > 02/03/2013  215 > 05/12/2013 211 > 06/24/2013  212>210 07/09/13 > 12/30/2013  213 > 01/21/2014  208 >208 02/05/2014 > 03/26/2014 211 > 05/25/2014 216>211 06/22/2014 > 212 03/18/2015 >  09/09/2015   215 >   212 12/12/2015 > 03/15/2016  212 > 05/25/2016   191 > 09/13/2016  182 > 06/24/2017   173 >  05/30/2018    166 > 08/28/2018  176  Vital signs reviewed - Note on arrival 02 sats  100% on RA    HEENT : pt wearing mask not removed for exam due to covid -19 concerns.    NECK :  without JVD/Nodes/TM/ nl carotid upstrokes bilaterally   LUNGS: no acc muscle use,  Nl contour chest which is clear to A and P bilaterally without cough on insp or exp maneuvers   CV:  RRR  no s3 or murmur or increase in P2, and no edema   ABD:  soft and nontender with nl inspiratory excursion in the supine position. No bruits or organomegaly appreciated, bowel sounds nl  MS:  Nl gait/ ext warm without deformities, calf tenderness, cyanosis or clubbing No obvious joint restrictions   SKIN: warm and dry without lesions    NEURO:  alert, approp, nl sensorium with  no motor or cerebellar deficits apparent.          Assessment:

## 2019-08-28 NOTE — Patient Instructions (Signed)
Prednisone 10 mg take  4 each am x 2 days,   2 each am x 2 days,  1 each am x 2 days and stop   If continue with the night time cough see ent   Please schedule a follow up visit in 6 months but call sooner if needed

## 2019-08-29 ENCOUNTER — Encounter: Payer: Self-pay | Admitting: Internal Medicine

## 2019-08-29 NOTE — Assessment & Plan Note (Signed)
Onset 2014 with upper airway component  - PFT's 01/02/2013 wnl including fef 25-75 p am dulera 100 x 2 pffs  - FENO 09/13/2016  =   29 on symb 160 x 2 bid but has aodm so try symb 80 2bid  - Allergy profile 01/23/2018 >  Eos 0.1 /  IgE  188 RAST neg  08/28/2018  After extensive coaching inhaler device,  effectiveness =    75%   All goals of chronic asthma control met including optimal function and elimination of symptoms with minimal need for rescue therapy.  Contingencies discussed in full including contacting this office immediately if not controlling the symptoms using the rule of two's.    

## 2019-08-29 NOTE — Assessment & Plan Note (Signed)
-    Sinus CT 05/05/13 > Complete opacification of visualized portions of the frontal sinuses and ethmoid sinus air cells.  - rec ENT eval 05/06/2013 > surgery [name]) August 04 2013 > improved - Singulair trial 03/26/2014 >>>  - Allergy profile 03/26/2014 > IgE 77 with neg RAST - referred back to [facility] for epistaxis 05/25/2016   Flare in settin of recent uri ? Sinusitis already rx with augmentin but still cough/"wheeze"   rec Prednisone 10 mg take  4 each am x 2 days,   2 each am x 2 days,  1 each am x 2 days and stop  F/u ent if not resolved   Each maintenance medication was reviewed in detail including emphasizing most importantly the difference between maintenance and prns and under what circumstances the prns are to be triggered using an action plan format where appropriate.  Total time for H and P, chart review, counseling, reviewing device use  and generating customized AVS unique to this office visit / charting = 20 min

## 2019-11-02 ENCOUNTER — Other Ambulatory Visit: Payer: Self-pay | Admitting: Internal Medicine

## 2019-11-03 ENCOUNTER — Other Ambulatory Visit: Payer: Self-pay

## 2019-11-03 ENCOUNTER — Ambulatory Visit (INDEPENDENT_AMBULATORY_CARE_PROVIDER_SITE_OTHER): Payer: PRIVATE HEALTH INSURANCE | Admitting: Family Medicine

## 2019-11-03 ENCOUNTER — Encounter: Payer: Self-pay | Admitting: Family Medicine

## 2019-11-03 VITALS — BP 148/90 | HR 100 | Temp 98.2°F | Resp 16 | Ht 67.0 in | Wt 196.4 lb

## 2019-11-03 DIAGNOSIS — E1165 Type 2 diabetes mellitus with hyperglycemia: Secondary | ICD-10-CM | POA: Diagnosis not present

## 2019-11-03 DIAGNOSIS — R030 Elevated blood-pressure reading, without diagnosis of hypertension: Secondary | ICD-10-CM | POA: Diagnosis not present

## 2019-11-03 LAB — POCT GLYCOSYLATED HEMOGLOBIN (HGB A1C): Hemoglobin A1C: 6.2 % — AB (ref 4.0–5.6)

## 2019-11-03 MED ORDER — METFORMIN HCL 500 MG PO TABS: ORAL_TABLET | ORAL | 2 refills | Status: DC

## 2019-11-03 NOTE — Progress Notes (Signed)
HPI: Mark Ward is a 54 y.o. male, who is here today for 6 months follow up.   Mark Ward was last seen on 05/06/19. Since his last visit Mark Ward has follow-up for asthma with his pulmonologist, Dr. Leonides Schanz.  DM2:Dx'ed on 05/25/16. Currently Mark Ward is on Metformin 500 mg in the morning and 1000 mg at night with supper. Mark Ward is tolerating medication well. Fasting BS's: 120-130's. 200's x 1 in 06/3019. Negative for abdominal pain, nausea,vomiting, polydipsia,polyuria, or polyphagia.  Lab Results  Component Value Date   HGBA1C 5.9 (A) 05/06/2019   Mark Ward is also on atorvastatin 10 mg daily.  Lab Results  Component Value Date   CHOL 141 11/03/2018   HDL 46.90 11/03/2018   LDLCALC 66 11/03/2018   TRIG 139.0 11/03/2018   CHOLHDL 3 11/03/2018   Lab Results  Component Value Date   MICROALBUR 1.0 11/03/2018   MICROALBUR 0.9 11/12/2017   BP is elevated today. No history of hypertension. Mark Ward recently started a OTC medicine supplement to help with testosterone. Mark Ward has not noted unusual/frequent headache, CP, dyspnea, palpitations, or edema.  Review of Systems  Constitutional: Negative for activity change, appetite change, fatigue and fever.  HENT: Negative for mouth sores, nosebleeds and sore throat.   Eyes: Negative for redness and visual disturbance.  Respiratory: Negative for cough and wheezing.   Gastrointestinal:       No changes in bowel habits.  Genitourinary: Negative for decreased urine volume and hematuria.  Musculoskeletal: Negative for gait problem and myalgias.  Skin: Negative for rash and wound.  Neurological: Negative for syncope, facial asymmetry, weakness and numbness.  Rest of ROS, see pertinent positives sand negatives in HPI  Current Outpatient Medications on File Prior to Visit  Medication Sig Dispense Refill  . acetaminophen (TYLENOL) 500 MG tablet Take 500 mg by mouth every 6 (six) hours as needed.    [name] albuterol (PROAIR HFA) 108 (90 Base) MCG/ACT inhaler 2 puffs  every 4 hours if needed 1 Inhaler 1  . Aloe-Sodium Chloride (AYR SALINE NASAL GEL NA) As needed    . Arginine 500 MG CAPS Take 500 capsules by mouth daily.    [name] atorvastatin (LIPITOR) 10 MG tablet TAKE 1 TABLET BY MOUTH EVERY DAY 90 tablet 3  . budesonide-formoterol (SYMBICORT) 80-4.5 MCG/ACT inhaler Inhale 2 puffs into the lungs 2 (two) times daily. 3 Inhaler 3  . Cholecalciferol (VITAMIN D) 50 MCG (2000 UT) CAPS Take 1 capsule by mouth daily.    [name] glucose blood (ONETOUCH VERIO) test strip Use as instructed 100 each 12  . guaifenesin (HUMIBID E) 400 MG TABS tablet Take 400 mg by mouth every 6 (six) hours as needed.    . Insulin Pen Needle 31G X 5 MM MISC Pt is to check blood sugar before breakfast and before evening meal 100 each 3  . Iodine, Kelp, (KELP PO) Take 1 tablet by mouth daily.    . Lancets (ONETOUCH ULTRASOFT) lancets Use as instructed 100 each 12  . LYSINE PO Take 1 capsule by mouth daily.    . montelukast (SINGULAIR) 10 MG tablet TAKE 1 TABLET BY MOUTH EVERY DAY 90 tablet 3  . Multiple Vitamins-Minerals (ZINC PO) Take 1 tablet by mouth daily.    [name] oxymetazoline (AFRIN) 0.05 % nasal spray 2 puffs twice daily x 5 days, off for 2 wks.    . Probiotic Product (PROBIOTIC PO) Take 1 capsule by mouth daily.    . Saline GEL as needed.    [name]  sodium chloride (OCEAN) 0.65 % SOLN nasal spray Place 1 spray into both nostrils as needed for congestion.    [name] UNABLE TO FIND Med Name: Loviral 4-5 x per day     No current facility-administered medications on file prior to visit.     Past Medical History:  Diagnosis Date  . Allergy   . Bronchitis   . Chronic sinusitis   . Diabetes mellitus without complication ([place])   . Migraines    No Known Allergies  Social History   Socioeconomic History  . Marital status: Single    Spouse name: Not on file  . Number of children: Not on file  . Years of education: Not on file  . Highest education level: Not on file  Occupational History  .  Occupation: Research scientist (physical sciences): [employer]  Tobacco Use  . Smoking status: Never Smoker  . Smokeless tobacco: Never Used  Vaping Use  . Vaping Use: Never used  Substance and Sexual Activity  . Alcohol use: No  . Drug use: No  . Sexual activity: Not Currently  Other Topics Concern  . Not on file  Social History Narrative  . Not on file   Social Determinants of Health   Financial Resource Strain:   . Difficulty of Paying Living Expenses:   Food Insecurity:   . Worried About Charity fundraiser in the Last Year:   . Arboriculturist in the Last Year:   Transportation Needs:   . Film/video editor (Medical):   [name] Lack of Transportation (Non-Medical):   Physical Activity:   . Days of Exercise per Week:   . Minutes of Exercise per Session:   Stress:   . Feeling of Stress :   Social Connections:   . Frequency of Communication with Friends and Family:   . Frequency of Social Gatherings with Friends and Family:   . Attends Religious Services:   . Active Member of Clubs or Organizations:   . Attends Archivist Meetings:   [name] Marital Status:     Vitals:   11/03/19 0955  BP: (!) 148/90  Pulse: 100  Resp: 16  Temp: 98.2 F (36.8 C)  SpO2: 93%   Body mass index is 30.76 kg/m.  Physical Exam Nursing note reviewed.  Constitutional:      General: Mark Ward is not in acute distress.    Appearance: Mark Ward is well-developed.  HENT:     Head: Normocephalic and atraumatic.     Mouth/Throat:     Mouth: Mucous membranes are moist.     Pharynx: Oropharynx is clear.  Eyes:     Conjunctiva/sclera: Conjunctivae normal.     Pupils: Pupils are equal, round, and reactive to light.  Cardiovascular:     Rate and Rhythm: Normal rate and regular rhythm.     Pulses:          Dorsalis pedis pulses are 2+ on the right side and 2+ on the left side.     Heart sounds: No murmur heard.   Pulmonary:     Effort: Pulmonary effort is normal. No respiratory distress.     Breath sounds:  Normal breath sounds.  Abdominal:     Palpations: Abdomen is soft. There is no hepatomegaly or mass.     Tenderness: There is no abdominal tenderness.  Lymphadenopathy:     Cervical: No cervical adenopathy.  Skin:    General: Skin is warm.     Findings: No erythema  or rash.  Neurological:     General: No focal deficit present.     Mental Status: Mark Ward is alert and oriented to person, place, and time.     Cranial Nerves: No cranial nerve deficit.     Gait: Gait normal.  Psychiatric:     Comments: Well groomed, good eye contact.   ASSESSMENT AND PLAN:  Mark Ward was seen today for 6 months follow-up.  Orders Placed This Encounter  Procedures  . Comprehensive metabolic panel  . Microalbumin / creatinine urine ratio  . POC HgB A1c   Lab Results  Component Value Date   HGBA1C 6.2 (A) 11/03/2019   Type 2 diabetes mellitus with hyperglycemia, without long-term current use of insulin (HCC) HgA1C at goal. No changes in current management. Regular exercise and healthy diet with avoidance of added sugar food intake is an important part of treatment and recommended. Annual eye exam, periodic dental and foot care recommended. F/U in 5-6 months  -     metFORMIN (GLUCOPHAGE) 500 MG tablet; 500 mg with breakfast and 1000 mg with dinner.  Elevated blood pressure reading Re-checked 142/90. Instructed to monitor BP at home. Low-salt diet recommended. Advise caution with OTC, which could be contributing to elevated BP. Mark Ward will let me know about BP readings in about 3 to 4 weeks, before if needed.  Return in about 5 months (around 04/04/2020).  Athalia Setterlund G. [place], MD  [facility]. [place] office.  A few things to remember from today's visit:  Type 2 diabetes mellitus with hyperglycemia, without long-term current use of insulin ([place]) - Plan: POC HgB A1c, Comprehensive metabolic panel, Microalbumin / creatinine urine ratio, metFORMIN (GLUCOPHAGE) 500 MG  tablet  Elevated blood pressure reading  Re-checked 142/90. Monitor blood pressure at home and let me know about readings in 3-4 weeks. I would like blood pressure 130/85 or less. Monitor blood pressure at home. Caution with over the counter supplements. Low salt diet.  If you need refills please call your pharmacy. Do not use My Chart to request refills or for acute issues that need immediate attention.    Please be sure medication list is accurate. If a new problem present, please set up appointment sooner than planned today.

## 2019-11-03 NOTE — Patient Instructions (Addendum)
A few things to remember from today's visit:  Type 2 diabetes mellitus with hyperglycemia, without long-term current use of insulin ([place]) - Plan: POC HgB A1c, Comprehensive metabolic panel, Microalbumin / creatinine urine ratio, metFORMIN (GLUCOPHAGE) 500 MG tablet  Elevated blood pressure reading  Re-checked 142/90. Monitor blood pressure at home and let me know about readings in 3-4 weeks. I would like blood pressure 130/85 or less. Monitor blood pressure at home. Caution with over the counter supplements. Low salt diet.  If you need refills please call your pharmacy. Do not use My Chart to request refills or for acute issues that need immediate attention.    Please be sure medication list is accurate. If a new problem present, please set up appointment sooner than planned today.

## 2019-11-14 ENCOUNTER — Other Ambulatory Visit: Payer: Self-pay | Admitting: Family Medicine

## 2019-11-14 DIAGNOSIS — E1165 Type 2 diabetes mellitus with hyperglycemia: Secondary | ICD-10-CM

## 2020-04-05 ENCOUNTER — Encounter: Payer: Self-pay | Admitting: Internal Medicine

## 2020-04-05 ENCOUNTER — Ambulatory Visit (INDEPENDENT_AMBULATORY_CARE_PROVIDER_SITE_OTHER): Payer: PRIVATE HEALTH INSURANCE | Admitting: Internal Medicine

## 2020-04-05 ENCOUNTER — Other Ambulatory Visit: Payer: Self-pay

## 2020-04-05 VITALS — BP 150/78 | HR 107 | Temp 98.3°F | Ht 67.0 in | Wt 194.0 lb

## 2020-04-05 DIAGNOSIS — J45909 Unspecified asthma, uncomplicated: Secondary | ICD-10-CM

## 2020-04-05 DIAGNOSIS — Z23 Encounter for immunization: Secondary | ICD-10-CM

## 2020-04-05 DIAGNOSIS — J310 Chronic rhinitis: Secondary | ICD-10-CM

## 2020-04-05 NOTE — Progress Notes (Signed)
Subjective:     Patient ID: Mark Ward, male   DOB: 12/27/1965   MRN: [id number]  Brief patient profile:  15 yobm never smoker never significant allergies or asthma with new onset cough Feb 2014 variably improved then relapsed seen by ent x 3 by UC x3 2, x ER x 3 and finally admitted:   Admit date: 11/24/2012  Discharge date: 11/26/2012  Discharge Diagnoses:   Moderate persistent asthma with exacerbation     07/09/2013 NP  Folllow up and Med review  Returns for follow up and med review .  Reports breathing is doing well since last ov.  feels symptoms are more related to molds at work No SABA use.  Insurance will cover [employer].  Reviewed all his meds and organized them into a med calendar closely w/ pt education . Is following with ENT > sinus surgery 08/04/13 Mark Ward  rec Follow med calendar closely and bring to each visit. Continue on current regimen. Make sure to brush rinse and gargle after inhaler use. Continue followup with ENT    12/12/2015  [name]  Chief Complaint  Patient presents with  . Follow-up    pt states he is doing well, notes minor sinus congestion worse at work.    Very rare saba use / main issues are nasal and f/u Mark Ward  rec Plan A = Automatic = symbicort 160 Take 2 puffs first thing in am and then another 2 puffs about 12 hours later and singulair each am  Plan B = Backup Only use your albuterol as a rescue medication  Please schedule a follow up visit in 3 months but call sooner if needed  Use med calendar     06/24/2017  f/u ov/Mark Ward re: acute asthma exac  Chief Complaint  Patient presents with  . Acute Visit    productive cough,yellow sputum,tried tylenol cold,apple cider vinager,throat has some soreness,chest congestion   prior 1st week in Feb 2019 on rare  saba maint on symb 80 and doing fine with ex/ sleep Then gradually worse cough/ sob with onset  nasal congestion >no rx Dyspnea:   Now sometimes at rest, better p saba but ran out "I guess I used too much" about a week agao  Cough: worse x one week/  ? Worse p supper and better in bed and sleeping flat fine rec Prednisone 10 mg take  4 each am x 2 days,   2 each am x 2 days,  1 each am x 2 days and stop  Augmentin 875 mg take one pill twice daily  X 10 days -  Plan A = Automatic = symbicort 80 Take 2 puffs first thing in am and then another 2 puffs about 12 hours later.  Work on Education officer, community B = Backup Only use your albuterol as a rescue medication Please schedule a follow up visit in 3 months but call sooner if needed to see [name] for new med calendar so bring all meds with you to this visit            05/30/2018  Acute extended ov/Mark Ward re: asthma  With flare of cough ? Sinusitis  Chief Complaint  Patient presents with  . Acute Visit    Pt c/o cough, sore throat and nasal d/c x 5 days. He is coughing up bright yellow sputum and has bright yellow nasal d/c. He is using his albuterol inhaler daily.   Dyspnea:  Not  limited by breathing from desired activities   Cough: x 5 days with nasal d/c green/ s/p dental surgery 05/12/18  Sleeping: since onset worse due to nasal congestioon  SABA use: rarely unless out of symbicort rec Prednisone 10 mg take  4 each am x 2 days,   2 each am x 2 days,  1 each am x 2 days and stop Augmentin 875 mg take one pill twice daily  X 10 days Continue symbicort 80 Take 2 puffs first thing in am and then another 2 puffs about 12 hours later   08/28/2018  f/u ov/Mark Ward re: asthma / does fine as long as maint on symbicort 80/ singulair  Chief Complaint  Patient presents with  . Follow-up    3 mth f/u, doing well, needs refill on albuterol inhaler   Dyspnea:  Not limited by breathing from desired activities / pushes mower x 1.5 h  Cough: daytime time / min productive not noct Sleeping: 2 pillows / bed is flat SABA use: ran out early  because used it all up p symbicort ran out  first 02: none  Plan A = Automatic = symbicort 80 Take 2 puffs first thing in am and then another 2 puffs about 12 hours later.  Work on inhaler technique:  Plan B = Backup Only use your albuterol inhaler as a rescue medication If get worse >  Prednisone 10 mg take  4 each am x 2 days,   2 each am x 2 days,  1 each am x 2 days and stop  Please schedule a follow up visit in 6 months but call sooner if needed  with all medications /inhalers/ solutions in hand so we can verify exactly what you are taking. 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 Follow-up    Cough has improved some. He is c/o right ear being stopped up. He is using his albuterol about 2 x per wk on average.   Dyspnea: Not limited by breathing from desired activities  Including push mower x 40 min  Cough: minimal sporadic day > noct, minimal mucoid production  Sleeping: ok  SABA use: as above  rec No change in medications We can start the paperwork for [place] for symbicort samples  Use your ear wax kit and if not better see Dr Benjamine Mola since Dr Ernesto Rutherford has retired    07/22/19 televisit NP flare since mid march cough We will send in a prescription for Augmentin 875 mg take one pill twice daily  X 10 days. Take until gone Take with your probiotics Call the office if you get worse not better Continue symbicort 80 Take 2 puffs first thing in am and then another 2 puffs about 12 hours later Rinse mouth after use. Continue Singulair daily Continue Allegra daily    08/28/2019  f/u ov/Mark Ward re: asthma on symb 80 2bid - worse cough  mid march 2021  Second covid shot April 12th Chief Complaint  Patient presents with  . 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 Follow-up    Patient is using saline nasal rinse more often, needs flu shot, breathing has been good  Dyspnea:  Not limited by breathing from desired activities   Cough: sometimes related to pnds on flonase and "afrin x 5 days every 5 days like you told me"  (not correct)  Sleeping: bed is flat/ 2 pillows SABA use: rarely  02: none    No obvious day to day or daytime variability or assoc excess/ purulent sputum or mucus plugs or hemoptysis or cp or chest tightness, subjective wheeze or overt   hb symptoms.   sleepign without nocturnal  or early am exacerbation  of respiratory  c/o's or need for noct saba. Also denies any obvious fluctuation of symptoms with weather or environmental changes or other aggravating or alleviating factors except as outlined above   No unusual exposure hx or h/o childhood pna/ asthma or knowledge of premature birth.  Current Allergies, Complete Past Medical History, Past Surgical History, Family History, and Social History were reviewed in [employer] record.  ROS  The following are not active complaints unless bolded Hoarseness, sore throat, dysphagia, dental problems, itching, sneezing,  nasal congestion or discharge of excess mucus or purulent secretions, ear ache,   fever, chills, sweats, unintended wt loss or wt gain, classically pleuritic or exertional cp,  orthopnea pnd or arm/hand swelling  or leg swelling, presyncope, palpitations, abdominal pain, anorexia, nausea, vomiting, diarrhea  or change in bowel habits or change in bladder habits, change in stools or change in urine, dysuria, hematuria,  rash, arthralgias, visual complaints, headache, numbness, weakness or ataxia or problems  with walking or coordination,  change in mood or  memory.        Current Meds  Medication Sig  . acetaminophen (TYLENOL) 500 MG tablet Take 500 mg by mouth every 6 (six) hours as needed.  [name] albuterol (PROAIR HFA) 108 (90 Base) MCG/ACT inhaler 2 puffs every 4 hours if needed  . Aloe-Sodium Chloride (AYR SALINE NASAL GEL NA) As needed  . Arginine 500 MG CAPS Take 500 capsules by mouth daily.  [name] atorvastatin (LIPITOR) 10 MG tablet TAKE 1 TABLET BY MOUTH EVERY DAY  . budesonide-formoterol (SYMBICORT) 80-4.5 MCG/ACT inhaler Inhale 2 puffs into the lungs 2 (two) times daily.  . Cholecalciferol (VITAMIN D) 50 MCG (2000 UT) CAPS Take 1 capsule by mouth daily.  [name] glucose blood (ONETOUCH VERIO) test strip Use as instructed  . guaifenesin (HUMIBID E) 400 MG TABS tablet Take 400 mg by mouth every 6 (six) hours as needed.  . Insulin Pen Needle 31G X 5 MM MISC Pt is to check blood sugar before breakfast and before evening meal  . Iodine, Kelp, (KELP PO) Take 1 tablet by mouth daily.  . Lancets (ONETOUCH ULTRASOFT) lancets Use as instructed  . LYSINE PO Take 1 capsule by mouth daily.  . metFORMIN (GLUCOPHAGE) 500 MG tablet TAKE 1 TABLET BY MOUTH TWICE A DAY WITH A MEAL  . montelukast (SINGULAIR) 10 MG tablet TAKE 1 TABLET BY MOUTH EVERY DAY  . Multiple Vitamins-Minerals (ZINC PO) Take 1 tablet by mouth daily.  [name] oxymetazoline (AFRIN) 0.05 % nasal spray 2 puffs twice daily x 5 days, off for 2 wks.  . Probiotic Product (  PROBIOTIC PO) Take 1 capsule by mouth daily.  . Saline GEL as needed.  . sodium chloride (OCEAN) 0.65 % SOLN nasal spray Place 1 spray into both nostrils as needed for congestion.  [name] UNABLE TO FIND Med Name: Loviral 4-5 x per day                 Objective:   Physical Exam      04/05/2020    194 08/28/2019     192  02/27/2019   197 01/02/2013  212  > 02/03/2013  215 > 05/12/2013 211 > 06/24/2013  212>210 07/09/13 > 12/30/2013  213 > 01/21/2014  208 >208 02/05/2014 > 03/26/2014 211 >  05/25/2014 216>211 06/22/2014 > 212 03/18/2015 >  09/09/2015   215 >   212 12/12/2015 > 03/15/2016  212 > 05/25/2016   191 > 09/13/2016  182 > 06/24/2017   173 >  05/30/2018    166 > 08/28/2018  176  Vital signs reviewed  04/05/2020  - Note at rest 02 sats  97% on RA   HEENT : pt wearing mask not removed for exam due to covid -19 concerns.    NECK :  without JVD/Nodes/TM/ nl carotid upstrokes bilaterally   LUNGS: no acc muscle use,  Nl contour chest which is clear to A and P bilaterally without cough on insp or exp maneuvers   CV:  RRR  no s3 or murmur or increase in P2, and no edema   ABD:  soft and nontender with nl inspiratory excursion in the supine position. No bruits or organomegaly appreciated, bowel sounds nl  MS:  Nl gait/ ext warm without deformities, calf tenderness, cyanosis or clubbing No obvious joint restrictions   SKIN: warm and dry without lesions    NEURO:  alert, approp, nl sensorium with  no motor or cerebellar deficits apparent.           Assessment:

## 2020-04-05 NOTE — Assessment & Plan Note (Signed)
Onset 2014 with upper airway component  - PFT's 01/02/2013 wnl including fef 25-75 p am dulera 100 x 2 pffs  - FENO 09/13/2016  =   29 on symb 160 x 2 bid but has aodm so try symb 80 2bid  - Allergy profile 01/23/2018 >  Eos 0.1 /  IgE  188 RAST neg  08/28/2018  After extensive coaching inhaler device,  effectiveness =    75%   All goals of chronic asthma control met including optimal function and elimination of symptoms with minimal need for rescue therapy.  Contingencies discussed in full including contacting this office immediately if not controlling the symptoms using the rule of two's.

## 2020-04-05 NOTE — Patient Instructions (Addendum)
No change in pulmonary medications  Only use afrin x 5 days max and only if you have nasal congestion/stuffy  that prevents flonase getting in and being effective   Follow up with Dr [name]' group  regarding your ongoing symptoms    Please schedule a follow up visit in  6 months but call sooner if needed

## 2020-04-05 NOTE — Assessment & Plan Note (Signed)
Onset 2014   Sinus CT 05/05/13 > Complete opacification of visualized portions of the frontal sinuses and ethmoid sinus air cells.  - rec ENT eval 05/06/2013 > surgery [name]) August 04 2013 > improved - Singulair trial 03/26/2014 >>>  - Allergy profile 03/26/2014 > IgE 77 with neg RAST - referred back to [name]' group  for epistaxis 05/25/2016    misundersood previous instructions re use of afrin and has been using it in a way that may have promoted rhinitis medicmentosa so reiterated afrin is up to 5 days and only repeated p 5 days off if still having nasal obst/ stuffiness while on maint flonase   >>> referred back to [place]' group           Each maintenance medication was reviewed in detail including emphasizing most importantly the difference between maintenance and prns and under what circumstances the prns are to be triggered using an action plan format where appropriate.  Total time for H and P, chart review, counseling, teaching device and generating customized AVS unique to this office visit / charting = 35 min

## 2020-04-14 ENCOUNTER — Other Ambulatory Visit: Payer: Self-pay | Admitting: Family Medicine

## 2020-04-27 ENCOUNTER — Ambulatory Visit (INDEPENDENT_AMBULATORY_CARE_PROVIDER_SITE_OTHER): Payer: PRIVATE HEALTH INSURANCE

## 2020-04-27 ENCOUNTER — Other Ambulatory Visit (HOSPITAL_COMMUNITY)
Admission: RE | Admit: 2020-04-27 | Discharge: 2020-04-27 | Disposition: A | Discharge status: Home / Self Care | Payer: PRIVATE HEALTH INSURANCE | Source: Ambulatory Visit | Attending: Family Medicine | Admitting: Family Medicine

## 2020-04-27 ENCOUNTER — Other Ambulatory Visit: Payer: Self-pay

## 2020-04-27 ENCOUNTER — Encounter: Payer: Self-pay | Admitting: Family Medicine

## 2020-04-27 ENCOUNTER — Ambulatory Visit (INDEPENDENT_AMBULATORY_CARE_PROVIDER_SITE_OTHER): Payer: PRIVATE HEALTH INSURANCE | Admitting: Family Medicine

## 2020-04-27 VITALS — BP 124/70 | HR 96 | Resp 16 | Ht 67.0 in | Wt 193.0 lb

## 2020-04-27 DIAGNOSIS — R319 Hematuria, unspecified: Secondary | ICD-10-CM | POA: Diagnosis present

## 2020-04-27 DIAGNOSIS — M545 Low back pain, unspecified: Secondary | ICD-10-CM

## 2020-04-27 DIAGNOSIS — E1165 Type 2 diabetes mellitus with hyperglycemia: Secondary | ICD-10-CM | POA: Diagnosis not present

## 2020-04-27 LAB — URINALYSIS, MICROSCOPIC ONLY

## 2020-04-27 LAB — POCT URINALYSIS DIPSTICK
Bilirubin, UA: NEGATIVE
Blood, UA: POSITIVE
Glucose, UA: NEGATIVE
Ketones, UA: NEGATIVE
Leukocytes, UA: NEGATIVE
Nitrite, UA: NEGATIVE
Protein, UA: POSITIVE — AB
Spec Grav, UA: 1.025 (ref 1.010–1.025)
Urobilinogen, UA: 0.2 E.U./dL
pH, UA: 5 (ref 5.0–8.0)

## 2020-04-27 LAB — CBC
HCT: 43 % (ref 39.0–52.0)
Hemoglobin: 14.4 g/dL (ref 13.0–17.0)
MCHC: 33.4 g/dL (ref 30.0–36.0)
MCV: 86.4 fl (ref 78.0–100.0)
Platelets: 239 10*3/uL (ref 150.0–400.0)
RBC: 4.97 Mil/uL (ref 4.22–5.81)
RDW: 13.6 % (ref 11.5–15.5)
WBC: 5.8 10*3/uL (ref 4.0–10.5)

## 2020-04-27 LAB — BASIC METABOLIC PANEL
BUN: 21 mg/dL (ref 6–23)
CO2: 30 mEq/L (ref 19–32)
Calcium: 9.6 mg/dL (ref 8.4–10.5)
Chloride: 100 mEq/L (ref 96–112)
Creatinine, Ser: 1.01 mg/dL (ref 0.40–1.50)
GFR: 85.44 mL/min (ref >60.00)
Glucose, Bld: 167 mg/dL — ABNORMAL HIGH (ref 70–99)
Potassium: 3.9 mEq/L (ref 3.5–5.1)
Sodium: 138 mEq/L (ref 135–145)

## 2020-04-27 LAB — HEMOGLOBIN A1C: Hgb A1c MFr Bld: 6.6 % — ABNORMAL HIGH (ref 4.6–6.5)

## 2020-04-27 NOTE — Patient Instructions (Addendum)
A few things to remember from today's visit:   Type 2 diabetes mellitus with hyperglycemia, without long-term current use of insulin (HCC) - Plan: Basic metabolic panel, Hemoglobin A1c, Fructosamine  Hematuria, unspecified type - Plan: Urine Microscopic Only, CBC, Urine cytology ancillary only, DG Abd 1 View, Culture, Urine  Acute left-sided low back pain without sciatica - Plan: POCT urinalysis dipstick, DG Abd 1 View  If you need refills please call your pharmacy. Do not use My Chart to request refills or for acute issues that need immediate attention.  We will arrange abdominal imaging. Use strainer when voiding. Monitor for new symptoms. Increase fluid intake. No changes in your meds today.    Please be sure medication list is accurate. If a new problem present, please set up appointment sooner than planned today.

## 2020-04-27 NOTE — Progress Notes (Signed)
Chief Complaint  Patient presents with  . pain in back   HPI: [name] is a 54 y.o. male, who is here today with above complaint. Left lower back pain that started 03/19/20, it has been intermittently. He has had 3 episodes. No hx of back pain. Negative for any recent injury or unusual level of activity.  Pain is not radiated, dull like, 7/10 in intensity, with no associated LE numbness, tingling, urinary incontinence or retention, stool incontinence, or saddle anesthesia. He has not identified exacerbating or alleviating factors.  Mild dysuria,achy like sensation, intermittently during urination. No rectal pain.  Noted "red" urine, thinks may be caused by Biotin supplementation. He does not think he is drinking enough fluids.  Increased frequency of bowel movements, x 3 yesterday but no changes in consistency. Negative for fever,chills,abdominal pain,nausea,or vomiting.  Kidney stones in his 20's.  OTC medications: Azo  DM II: He is on Metformin 500 mg bid. BS's fasting: 110's. Negative for hypoglycemic episodes.  Negative for polydipsia,polyuria, or polyphagia.  Lab Results  Component Value Date   HGBA1C 6.2 (A) 11/03/2019   Lab Results  Component Value Date   CREATININE 0.79 11/03/2018   BUN 12 11/03/2018   NA 140 11/03/2018   K 3.9 11/03/2018   CL 102 11/03/2018   CO2 30 11/03/2018   Review of Systems  Constitutional: Negative for activity change, appetite change and fatigue.  HENT: Negative for mouth sores, nosebleeds and sore throat.   Eyes: Negative for redness and visual disturbance.  Respiratory: Negative for cough, shortness of breath and wheezing.   Cardiovascular: Negative for chest pain, palpitations and leg swelling.  Genitourinary: Negative for decreased urine volume, penile discharge, penile pain and testicular pain.  Rest see pertinent positives and negatives per HPI.  Current Outpatient Medications on File Prior to Visit   Medication Sig Dispense Refill  . acetaminophen (TYLENOL) 500 MG tablet Take 500 mg by mouth every 6 (six) hours as needed.    [name] albuterol (PROAIR HFA) 108 (90 Base) MCG/ACT inhaler 2 puffs every 4 hours if needed 1 Inhaler 1  . Aloe-Sodium Chloride (AYR SALINE NASAL GEL NA) As needed    . Arginine 500 MG CAPS Take 500 capsules by mouth daily.    [name] atorvastatin (LIPITOR) 10 MG tablet TAKE 1 TABLET BY MOUTH EVERY DAY 90 tablet 3  . budesonide-formoterol (SYMBICORT) 80-4.5 MCG/ACT inhaler Inhale 2 puffs into the lungs 2 (two) times daily. 3 Inhaler 3  . Cholecalciferol (VITAMIN D) 50 MCG (2000 UT) CAPS Take 1 capsule by mouth daily.    [name] glucose blood (ONETOUCH VERIO) test strip Use as instructed 100 each 12  . guaifenesin (HUMIBID E) 400 MG TABS tablet Take 400 mg by mouth every 6 (six) hours as needed.    . Insulin Pen Needle 31G X 5 MM MISC Pt is to check blood sugar before breakfast and before evening meal 100 each 3  . Iodine, Kelp, (KELP PO) Take 1 tablet by mouth daily.    . Lancets (ONETOUCH ULTRASOFT) lancets Use as instructed 100 each 12  . LYSINE PO Take 1 capsule by mouth daily.    . metFORMIN (GLUCOPHAGE) 500 MG tablet TAKE 1 TABLET BY MOUTH TWICE A DAY WITH A MEAL 180 tablet 3  . montelukast (SINGULAIR) 10 MG tablet TAKE 1 TABLET BY MOUTH EVERY DAY 90 tablet 3  . Multiple Vitamins-Minerals (ZINC PO) Take 1 tablet by mouth daily.    [name] oxymetazoline (AFRIN) 0.05 %  nasal spray 2 puffs twice daily x 5 days, off for 2 wks.    . Probiotic Product (PROBIOTIC PO) Take 1 capsule by mouth daily.    . Saline GEL as needed.    . sodium chloride (OCEAN) 0.65 % SOLN nasal spray Place 1 spray into both nostrils as needed for congestion.    [name] UNABLE TO FIND Med Name: Loviral 4-5 x per day     No current facility-administered medications on file prior to visit.   Past Medical History:  Diagnosis Date  . Allergy   . Bronchitis   . Chronic sinusitis   . Diabetes mellitus without complication  (HCC)   . Migraines    No Known Allergies  Social History   Socioeconomic History  . Marital status: Single    Spouse name: Not on file  . Number of children: Not on file  . Years of education: Not on file  . Highest education level: Not on file  Occupational History  . Occupation: Nutritional therapist: [employer]  Tobacco Use  . Smoking status: Never Smoker  . Smokeless tobacco: Never Used  Vaping Use  . Vaping Use: Never used  Substance and Sexual Activity  . Alcohol use: No  . Drug use: No  . Sexual activity: Not Currently  Other Topics Concern  . Not on file  Social History Narrative  . Not on file   Social Determinants of Health   Financial Resource Strain: Not on file  Food Insecurity: Not on file  Transportation Needs: Not on file  Physical Activity: Not on file  Stress: Not on file  Social Connections: Not on file   Vitals:   04/27/20 0703  BP: 124/70  Pulse: 96  Resp: 16  SpO2: 99%   Body mass index is 30.23 kg/m.  Physical Exam Vitals and nursing note reviewed.  Constitutional:      General: He is not in acute distress.    Appearance: He is well-developed.  HENT:     Head: Normocephalic and atraumatic.     Mouth/Throat:     Mouth: Oropharynx is clear and moist. Mucous membranes are dry.     Pharynx: Oropharynx is clear.  Eyes:     Conjunctiva/sclera: Conjunctivae normal.     Pupils: Pupils are equal, round, and reactive to light.  Cardiovascular:     Rate and Rhythm: Normal rate and regular rhythm.     Pulses:          Dorsalis pedis pulses are 2+ on the right side and 2+ on the left side.     Heart sounds: No murmur heard.   Pulmonary:     Effort: Pulmonary effort is normal. No respiratory distress.     Breath sounds: Normal breath sounds.  Abdominal:     Palpations: Abdomen is soft. There is no hepatomegaly or mass.     Tenderness: There is no abdominal tenderness. There is no right CVA tenderness or left CVA tenderness.   Musculoskeletal:        General: No edema.     Lumbar back: No tenderness or bony tenderness.  Lymphadenopathy:     Cervical: No cervical adenopathy.  Skin:    General: Skin is warm.     Findings: No erythema or rash.  Neurological:     Mental Status: He is alert and oriented to person, place, and time.     Cranial Nerves: No cranial nerve deficit.     Gait: Gait normal.  Deep Tendon Reflexes: Strength normal.  Psychiatric:        Mood and Affect: Mood and affect normal.        Cognition and Memory: Cognition and memory normal.     Comments: Well groomed, good eye contact.   ASSESSMENT AND PLAN:  Mark Ward was seen today for pain in back.  Diagnoses and all orders for this visit: Orders Placed This Encounter  Procedures  . Culture, Urine  . DG Abd 1 View  . Basic metabolic panel  . CBC  . Fructosamine  . Hemoglobin A1c  . Urine Microscopic Only  . POCT urinalysis dipstick   Lab Results  Component Value Date   WBC 5.8 04/27/2020   HGB 14.4 04/27/2020   HCT 43.0 04/27/2020   MCV 86.4 04/27/2020   PLT 239.0 04/27/2020   Lab Results  Component Value Date   HGBA1C 6.6 (H) 04/27/2020   Lab Results  Component Value Date   CREATININE 1.01 04/27/2020   BUN 21 04/27/2020   NA 138 04/27/2020   K 3.9 04/27/2020   CL 100 04/27/2020   CO2 30 04/27/2020   Type 2 diabetes mellitus with hyperglycemia, without long-term current use of insulin ([place]) HgA1C has been at goal. Continue Metformin 500 mg bid. Annual eye exam, periodic dental and foot care recommended. F/U in 5-6 months  Hematuria, unspecified type Urine dipstick with 2+ blood. We discussed possible etiologies. Hx does not suggest an infectious process.  Acute left-sided low back pain without sciatica Does not seem to be musculoskeletal. Nephrolithiasis to be considered. Adequate hydration. Further recommendations according to imaging results.   Return in about 5 months (around 09/25/2020) for  Cancell appt next month..   Tracy Kinner G. [place], MD  [facility]. [place] office.   A few things to remember from today's visit:   Type 2 diabetes mellitus with hyperglycemia, without long-term current use of insulin ([place]) - Plan: Basic metabolic panel, Hemoglobin A1c, Fructosamine  Hematuria, unspecified type - Plan: Urine Microscopic Only, CBC, Urine cytology ancillary only, DG Abd 1 View, Culture, Urine  Acute left-sided low back pain without sciatica - Plan: POCT urinalysis dipstick, DG Abd 1 View  If you need refills please call your pharmacy. Do not use My Chart to request refills or for acute issues that need immediate attention.  We will arrange abdominal imaging. Use strainer when voiding. Monitor for new symptoms. Increase fluid intake. No changes in your meds today.    Please be sure medication list is accurate. If a new problem present, please set up appointment sooner than planned today.

## 2020-04-28 LAB — URINE CYTOLOGY ANCILLARY ONLY
Chlamydia: NEGATIVE
Comment: NEGATIVE
Comment: NEGATIVE
Comment: NORMAL
Neisseria Gonorrhea: NEGATIVE
Trichomonas: NEGATIVE

## 2020-04-28 LAB — URINE CULTURE
MICRO NUMBER:: 11366380
Result:: NO GROWTH
SPECIMEN QUALITY:: ADEQUATE

## 2020-04-29 LAB — FRUCTOSAMINE: Fructosamine: 259 umol/L (ref 205–285)

## 2020-05-04 ENCOUNTER — Inpatient Hospital Stay: Admission: RE | Admit: 2020-05-04 | Payer: PRIVATE HEALTH INSURANCE | Source: Ambulatory Visit

## 2020-05-06 ENCOUNTER — Ambulatory Visit: Payer: PRIVATE HEALTH INSURANCE | Admitting: Family Medicine

## 2020-05-10 ENCOUNTER — Other Ambulatory Visit: Payer: PRIVATE HEALTH INSURANCE

## 2020-05-10 ENCOUNTER — Ambulatory Visit (INDEPENDENT_AMBULATORY_CARE_PROVIDER_SITE_OTHER)
Admission: RE | Admit: 2020-05-10 | Discharge: 2020-05-10 | Disposition: A | Discharge status: Home / Self Care | Payer: PRIVATE HEALTH INSURANCE | Source: Ambulatory Visit | Attending: Family Medicine | Admitting: Family Medicine

## 2020-05-10 ENCOUNTER — Other Ambulatory Visit: Payer: Self-pay

## 2020-05-10 DIAGNOSIS — R319 Hematuria, unspecified: Secondary | ICD-10-CM | POA: Diagnosis not present

## 2020-05-22 ENCOUNTER — Encounter: Payer: Self-pay | Admitting: Family Medicine

## 2020-05-22 DIAGNOSIS — I700 Atherosclerosis of aorta: Secondary | ICD-10-CM | POA: Insufficient documentation

## 2020-05-24 ENCOUNTER — Other Ambulatory Visit: Payer: Self-pay

## 2020-05-24 DIAGNOSIS — N200 Calculus of kidney: Secondary | ICD-10-CM

## 2020-05-26 ENCOUNTER — Telehealth: Payer: Self-pay | Admitting: Internal Medicine

## 2020-05-26 NOTE — Telephone Encounter (Signed)
Need pharmacy name- ATC pt and there was no answer and no option to leave msg, wcb.

## 2020-05-27 ENCOUNTER — Telehealth: Payer: Self-pay | Admitting: Internal Medicine

## 2020-05-27 MED ORDER — BUDESONIDE-FORMOTEROL FUMARATE 80-4.5 MCG/ACT IN AERO: 2.0000 | INHALATION_SPRAY | Freq: Two times a day (BID) | RESPIRATORY_TRACT | 0 refills | Status: DC

## 2020-05-27 NOTE — Telephone Encounter (Signed)
Rx for symbicort has been sent to preferred pharmacy.  ATC patient on mobile- unable to leave vm due to mailbox being full.  No answer on home phone with no option to leave vm.

## 2020-06-03 NOTE — Telephone Encounter (Signed)
ATC pt. Unable to leave VM. WCB.  

## 2020-06-07 NOTE — Telephone Encounter (Signed)
Symbicort refilled on 1.28.22. Will close encounter.

## 2020-07-24 ENCOUNTER — Other Ambulatory Visit: Payer: Self-pay | Admitting: Family Medicine

## 2020-07-24 DIAGNOSIS — E1165 Type 2 diabetes mellitus with hyperglycemia: Secondary | ICD-10-CM

## 2020-08-31 ENCOUNTER — Other Ambulatory Visit: Payer: Self-pay

## 2020-08-31 MED ORDER — BUDESONIDE-FORMOTEROL FUMARATE 80-4.5 MCG/ACT IN AERO: 2.0000 | INHALATION_SPRAY | Freq: Two times a day (BID) | RESPIRATORY_TRACT | 7 refills | Status: DC

## 2020-09-27 ENCOUNTER — Other Ambulatory Visit: Payer: Self-pay

## 2020-09-27 ENCOUNTER — Ambulatory Visit (INDEPENDENT_AMBULATORY_CARE_PROVIDER_SITE_OTHER): Payer: PRIVATE HEALTH INSURANCE | Admitting: Family Medicine

## 2020-09-27 ENCOUNTER — Encounter: Payer: Self-pay | Admitting: Family Medicine

## 2020-09-27 VITALS — BP 134/80 | HR 100 | Resp 16 | Ht 67.0 in | Wt 194.1 lb

## 2020-09-27 DIAGNOSIS — E1165 Type 2 diabetes mellitus with hyperglycemia: Secondary | ICD-10-CM | POA: Diagnosis not present

## 2020-09-27 DIAGNOSIS — I700 Atherosclerosis of aorta: Secondary | ICD-10-CM

## 2020-09-27 DIAGNOSIS — Z6832 Body mass index (BMI) 32.0-32.9, adult: Secondary | ICD-10-CM

## 2020-09-27 DIAGNOSIS — E6609 Other obesity due to excess calories: Secondary | ICD-10-CM | POA: Diagnosis not present

## 2020-09-27 DIAGNOSIS — R079 Chest pain, unspecified: Secondary | ICD-10-CM

## 2020-09-27 LAB — POCT GLYCOSYLATED HEMOGLOBIN (HGB A1C): HbA1c, POC (prediabetic range): 6.2 % (ref 5.7–6.4)

## 2020-09-27 NOTE — Patient Instructions (Addendum)
A few things to remember from today's visit:   Type 2 diabetes mellitus with hyperglycemia, without long-term current use of insulin ([place]) - Plan: POC HgB A1c  Atherosclerosis of aorta ([place]), Chronic  If you need refills please call your pharmacy. Do not use My Chart to request refills or for acute issues that need immediate attention.   You are due for eye exam. Calorie count will help with wt loss. Caution with corn intake. 10,000 steps daily is equivalent to exercise. Next visit we will do fasting labs. Aspirin 81 mg daily. Monitor for recurrence in chest pain. No changes in medications today.  Please be sure medication list is accurate. If a new problem present, please set up appointment sooner than planned today.

## 2020-09-27 NOTE — Progress Notes (Signed)
HPI: [name] is a 55 y.o. male, who is here today for 6 months follow up.   He was last seen on 04/27/20. DM II: Dx'ed in 04/2016. Lab Results  Component Value Date   HGBA1C 6.6 (H) 04/27/2020  Last eye exam in 06/2019. He is on Metformin 500 mg am and 1000 mg pm.  Negative for exertional CP,SOB,palpitations,abdominal pain, nausea,vomiting, polydipsia,polyuria, or polyphagia.  2 weeks ago he has mild right--sided CP, lasted all day. No associated symptoms. No hx of trauma. Problem has improved.  No hx of HTN. He does not check BP at home.  Lab Results  Component Value Date   CREATININE 1.01 04/27/2020   BUN 21 04/27/2020   NA 138 04/27/2020   K 3.9 04/27/2020   CL 100 04/27/2020   CO2 30 04/27/2020   Aortic atherosclerosis seen on imaging, renal CT on 05/10/2020: Vascular/Lymphatic: Atherosclerotic calcification is present within the non-aneurysmal abdominal aorta, without hemodynamically significant stenosis.  He is on Atorvastatin 10 mg daily. Tolerating medication well.  Lab Results  Component Value Date   CHOL 141 11/03/2018   HDL 46.90 11/03/2018   LDLCALC 66 11/03/2018   TRIG 139.0 11/03/2018   CHOLHDL 3 11/03/2018   Last urology visit in 05/2020 and next visit 11/2020. + hematuria last week. Passed stone 2 weeks ago.  He is active at work, does not have an exercise routine. He is trying to avoid certain food because kidney stones. Eating more corn.  Review of Systems  Constitutional: Negative for activity change, appetite change, fatigue, fever and unexpected weight change.  HENT: Negative for nosebleeds and sore throat.   Eyes: Negative for redness and visual disturbance.  Respiratory: Negative for cough and wheezing.   Cardiovascular: Negative for leg swelling.  Genitourinary: Negative for decreased urine volume and dysuria.  Musculoskeletal: Negative for gait problem and myalgias.  Skin: Negative for pallor and rash.   Neurological: Negative for syncope, weakness and headaches.  Rest of ROS, see pertinent positives sand negatives in HPI  Current Outpatient Medications on File Prior to Visit  Medication Sig Dispense Refill  . acetaminophen (TYLENOL) 500 MG tablet Take 500 mg by mouth every 6 (six) hours as needed.    [name] albuterol (PROAIR HFA) 108 (90 Base) MCG/ACT inhaler 2 puffs every 4 hours if needed 1 Inhaler 1  . Aloe-Sodium Chloride (AYR SALINE NASAL GEL NA) As needed    . Arginine 500 MG CAPS Take 500 capsules by mouth daily.    [name] atorvastatin (LIPITOR) 10 MG tablet TAKE 1 TABLET BY MOUTH EVERY DAY 90 tablet 3  . budesonide-formoterol (SYMBICORT) 80-4.5 MCG/ACT inhaler Inhale 2 puffs into the lungs 2 (two) times daily. 3 each 7  . Cholecalciferol (VITAMIN D) 50 MCG (2000 UT) CAPS Take 1 capsule by mouth daily.    [name] glucose blood (ONETOUCH VERIO) test strip Use as instructed 100 each 12  . guaifenesin (HUMIBID E) 400 MG TABS tablet Take 400 mg by mouth every 6 (six) hours as needed.    . Insulin Pen Needle 31G X 5 MM MISC Pt is to check blood sugar before breakfast and before evening meal 100 each 3  . Iodine, Kelp, (KELP PO) Take 1 tablet by mouth daily.    . Lancets (ONETOUCH ULTRASOFT) lancets Use as instructed 100 each 12  . LYSINE PO Take 1 capsule by mouth daily.    . metFORMIN (GLUCOPHAGE) 500 MG tablet TAKE 1 TABLET EVERY MORNING AND 2 TABLETS WITH  DINNER 270 tablet 0  . montelukast (SINGULAIR) 10 MG tablet TAKE 1 TABLET BY MOUTH EVERY DAY 90 tablet 3  . Multiple Vitamins-Minerals (ZINC PO) Take 1 tablet by mouth daily.    [name] oxymetazoline (AFRIN) 0.05 % nasal spray 2 puffs twice daily x 5 days, off for 2 wks.    . Probiotic Product (PROBIOTIC PO) Take 1 capsule by mouth daily.    . Saline GEL as needed.    . sodium chloride (OCEAN) 0.65 % SOLN nasal spray Place 1 spray into both nostrils as needed for congestion.    [name] UNABLE TO FIND Med Name: Loviral 4-5 x per day     No current  facility-administered medications on file prior to visit.    Past Medical History:  Diagnosis Date  . Allergy   . Bronchitis   . Chronic sinusitis   . Diabetes mellitus without complication ([place])   . Migraines    No Known Allergies  Social History   Socioeconomic History  . Marital status: Single    Spouse name: Not on file  . Number of children: Not on file  . Years of education: Not on file  . Highest education level: Not on file  Occupational History  . Occupation: Research scientist (physical sciences): [employer]  Tobacco Use  . Smoking status: Never Smoker  . Smokeless tobacco: Never Used  Vaping Use  . Vaping Use: Never used  Substance and Sexual Activity  . Alcohol use: No  . Drug use: No  . Sexual activity: Not Currently  Other Topics Concern  . Not on file  Social History Narrative  . Not on file   Social Determinants of Health   Financial Resource Strain: Not on file  Food Insecurity: Not on file  Transportation Needs: Not on file  Physical Activity: Not on file  Stress: Not on file  Social Connections: Not on file   Vitals:   09/27/20 0729  BP: 134/80  Pulse: 100  Resp: 16  SpO2: 97%   Wt Readings from Last 3 Encounters:  09/27/20 194 lb 2 oz (88.1 kg)  04/27/20 193 lb (87.5 kg)  04/05/20 194 lb (88 kg)   Body mass index is 30.4 kg/m.  Physical Exam Vitals and nursing note reviewed.  Constitutional:      General: He is not in acute distress.    Appearance: He is well-developed.  HENT:     Head: Normocephalic and atraumatic.     Mouth/Throat:     Mouth: Mucous membranes are moist.     Pharynx: Oropharynx is clear.  Eyes:     Conjunctiva/sclera: Conjunctivae normal.  Cardiovascular:     Rate and Rhythm: Normal rate and regular rhythm.     Pulses:          Dorsalis pedis pulses are 2+ on the right side and 2+ on the left side.     Heart sounds: No murmur heard.   Pulmonary:     Effort: Pulmonary effort is normal. No respiratory distress.      Breath sounds: Normal breath sounds.  Chest:     Chest wall: Tenderness present.  Abdominal:     Palpations: Abdomen is soft. There is no hepatomegaly or mass.     Tenderness: There is no abdominal tenderness.  Lymphadenopathy:     Cervical: No cervical adenopathy.  Skin:    General: Skin is warm.     Findings: No erythema or rash.  Neurological:  Mental Status: He is alert and oriented to person, place, and time.     Cranial Nerves: No cranial nerve deficit.     Gait: Gait normal.  Psychiatric:     Comments: Well groomed, good eye contact.   ASSESSMENT AND PLAN:  [name] was seen today for 6 months follow-up.  Orders Placed This Encounter  Procedures  . POC HgB A1c   Lab Results  Component Value Date   HGBA1C 6.2 09/27/2020   Type 2 diabetes mellitus with hyperglycemia, without long-term current use of insulin (HCC) HgA1C at goal. Continue Metformin 500 mg am and 1000 mg pm. Regular exercise and healthy diet with avoidance of added sugar food intake is an important part of treatment and recommended. Annual eye exam, periodic dental and foot care recommended. F/U in 5-6 months  Atherosclerosis of aorta (HCC) Continue Atorvastatin 10 mg daily. Daily Aspirin 81 mg daily recommended, some side effects discussed. Recommend monitoring BP at home.  Right-sided chest pain Hx and examination today do not suggest a serious process. Improved. I do not think imaging is needed at this time. Instructed about warning signs.  Class 1 obesity due to excess calories with serious comorbidity and body mass index (BMI) of 32.0 to 32.9 in adult Wt has been stable. He understands the benefits of wt loss as well as adverse effects of obesity. Consistency with healthy diet and physical activity recommended.   Return in about 6 months (around 03/29/2021) for CPE.  Ofelia Podolski G. [place], MD  [facility]. [place] office.   A few things to remember from  today's visit:   Type 2 diabetes mellitus with hyperglycemia, without long-term current use of insulin ([place]) - Plan: POC HgB A1c  Atherosclerosis of aorta ([place]), Chronic  If you need refills please call your pharmacy. Do not use My Chart to request refills or for acute issues that need immediate attention.   You are due for eye exam. Calorie count will help with wt loss. Caution with corn intake. 10,000 steps daily is equivalent to exercise. Next visit we will do fasting labs. Aspirin 81 mg daily. Monitor for recurrence in chest pain. No changes in medications today.  Please be sure medication list is accurate. If a new problem present, please set up appointment sooner than planned today.

## 2020-10-01 ENCOUNTER — Encounter: Payer: Self-pay | Admitting: Family Medicine

## 2020-10-04 ENCOUNTER — Ambulatory Visit (INDEPENDENT_AMBULATORY_CARE_PROVIDER_SITE_OTHER): Payer: PRIVATE HEALTH INSURANCE | Admitting: Internal Medicine

## 2020-10-04 ENCOUNTER — Encounter: Payer: Self-pay | Admitting: Internal Medicine

## 2020-10-04 ENCOUNTER — Other Ambulatory Visit: Payer: Self-pay

## 2020-10-04 DIAGNOSIS — J310 Chronic rhinitis: Secondary | ICD-10-CM

## 2020-10-04 DIAGNOSIS — J45909 Unspecified asthma, uncomplicated: Secondary | ICD-10-CM | POA: Diagnosis not present

## 2020-10-04 NOTE — Assessment & Plan Note (Signed)
Onset 2014 with upper airway component  - PFT's 01/02/2013 wnl including fef 25-75 p am dulera 100 x 2 pffs  - FENO 09/13/2016  =   29 on symb 160 x 2 bid but has aodm so try symb 80 2bid  - Allergy profile 01/23/2018 >  Eos 0.1 /  IgE  188 RAST neg  - 10/04/2020  After extensive coaching inhaler device,  effectiveness =   80%   All goals of chronic asthma control met including optimal function and elimination of symptoms with minimal need for rescue therapy.  Contingencies discussed in full including contacting this office immediately if not controlling the symptoms using the rule of two's.

## 2020-10-04 NOTE — Patient Instructions (Signed)
See calendar for specific medication instructions and bring it back for each and every office visit for every healthcare provider you see.  Without it,  you may not receive the best quality medical care that we feel you deserve.  You will note that the calendar groups together  your maintenance  medications that are timed at particular times of the day.  Think of this as your checklist for what your doctor has instructed you to do until your next evaluation to see what benefit  there is  to staying on a consistent group of medications intended to keep you well.  The other group at the bottom is entirely up to you to use as you see fit  for specific symptoms that may arise between visits that require you to treat them on an as needed basis.  Think of this as your action plan or "what if" list.   Separating the top medications from the bottom group is fundamental to providing you adequate care going forward.    See Dr [name] group asap and take your medications with you   Please schedule a follow up visit in 12 months but call sooner if needed

## 2020-10-04 NOTE — Progress Notes (Signed)
Subjective:    Patient ID: Mark Ward, male   DOB: [birth year]   MRN: [id number]  Brief patient profile:  53   yobm never smoker never significant allergies or asthma with new onset cough Feb 2014 variably improved then relapsed seen by ent x 3 by UC x3 2, x ER x 3 and finally admitted:   Admit date: 11/24/2012  Discharge date: 11/26/2012  Discharge Diagnoses:   Moderate persistent asthma with exacerbation     07/09/2013 NP  Folllow up and Med review  Returns for follow up and med review .  Reports breathing is doing well since last ov.  feels symptoms are more related to molds at work No SABA use.  Insurance will cover [employer].  Reviewed all his meds and organized them into a med calendar closely w/ pt education . Is following with ENT > sinus surgery 08/04/13 Mark Ward  rec Follow med calendar closely and bring to each visit. Continue on current regimen. Make sure to brush rinse and gargle after inhaler use. Continue followup with ENT    12/12/2015  [name]  Chief Complaint  Patient presents with  . Follow-up    pt states he is doing well, notes minor sinus congestion worse at work.    Very rare saba use / main issues are nasal and f/u Mark Ward  rec Plan A = Automatic = symbicort 160 Take 2 puffs first thing in am and then another 2 puffs about 12 hours later and singulair each am  Plan B = Backup Only use your albuterol as a rescue medication  Please schedule a follow up visit in 3 months but call sooner if needed  Use med calendar     06/24/2017  f/u ov/Mark Ward re: acute asthma exac  Chief Complaint  Patient presents with  . Acute Visit    productive cough,yellow sputum,tried tylenol cold,apple cider vinager,throat has some soreness,chest congestion   prior 1st week in Feb 2019 on rare  saba maint on symb 80 and doing fine with ex/ sleep Then gradually worse cough/ sob with onset  nasal congestion >no rx Dyspnea:   Now sometimes at rest, better p saba but ran out "I guess I used too much" about a week agao  Cough: worse x one week/  ? Worse p supper and better in bed and sleeping flat fine rec Prednisone 10 mg take  4 each am x 2 days,   2 each am x 2 days,  1 each am x 2 days and stop  Augmentin 875 mg take one pill twice daily  X 10 days -  Plan A = Automatic = symbicort 80 Take 2 puffs first thing in am and then another 2 puffs about 12 hours later.  Work on Education officer, community B = Backup Only use your albuterol as a rescue medication Please schedule a follow up visit in 3 months but call sooner if needed to see Mark Ward for new med calendar so bring all meds with you to this visit       05/30/2018  Acute extended ov/Mark Ward re: asthma  With flare of cough ? Sinusitis  Chief Complaint  Patient presents with  . Acute Visit    Pt c/o cough, sore throat and nasal d/c x 5 days. He is coughing up bright yellow sputum and has bright yellow nasal d/c. He is using his albuterol inhaler daily.   Dyspnea:  Not limited by breathing from  desired activities   Cough: x 5 days with nasal d/c green/ s/p dental surgery 05/12/18  Sleeping: since onset worse due to nasal congestioon  SABA use: rarely unless out of symbicort rec Prednisone 10 mg take  4 each am x 2 days,   2 each am x 2 days,  1 each am x 2 days and stop Augmentin 875 mg take one pill twice daily  X 10 days Continue symbicort 80 Take 2 puffs first thing in am and then another 2 puffs about 12 hours later   08/28/2018  f/u ov/Mark Ward re: asthma / does fine as long as maint on symbicort 80/ singulair  Chief Complaint  Patient presents with  . Follow-up    3 mth f/u, doing well, needs refill on albuterol inhaler   Dyspnea:  Not limited by breathing from desired activities / pushes mower x 1.5 h  Cough: daytime time / min productive not noct Sleeping: 2 pillows / bed is flat SABA use: ran out early  because used it all up p symbicort ran out first 02:  none  Plan A = Automatic = symbicort 80 Take 2 puffs first thing in am and then another 2 puffs about 12 hours later.  Work on inhaler technique:  Plan B = Backup Only use your albuterol inhaler as a rescue medication If get worse >  Prednisone 10 mg take  4 each am x 2 days,   2 each am x 2 days,  1 each am x 2 days and stop  Please schedule a follow up visit in 6 months but call sooner if needed  with all medications /inhalers/ solutions in hand so we can verify exactly what you are taking. 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 Follow-up    Cough has improved some. He is c/o right ear being stopped up. He is using his albuterol about 2 x per wk on average.   Dyspnea: Not limited by breathing from desired activities  Including push mower x 40 min  Cough: minimal sporadic day > noct, minimal mucoid production  Sleeping: ok  SABA use: as above  rec No change in medications We can start the paperwork for Mark Ward for symbicort samples  Use your ear wax kit and if not better see Dr Mark Ward since Dr Mark Ward has retired    07/22/19 televisit NP flare since mid march cough We will send in a prescription for Augmentin 875 mg take one pill twice daily  X 10 days. Take until gone Take with your probiotics Call the office if you get worse not better Continue symbicort 80 Take 2 puffs first thing in am and then another 2 puffs about 12 hours later Rinse mouth after use. Continue Singulair daily Continue Allegra daily    08/28/2019  f/u ov/Mark Ward re: asthma on symb 80 2bid - worse cough  mid march 2021  Second covid shot April 12th Chief Complaint  Patient presents with  . 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 Follow-up    Patient is using saline nasal rinse more often, needs flu shot, breathing has been good  Dyspnea:  Not limited by breathing from desired activities   Cough: sometimes related to pnds on flonase and "afrin x 5 days every 5 days like you told me"  (not correct)  Sleeping: bed is flat/ 2 pillows SABA use: rarely  02: none  rec No change in pulmonary medications Only use afrin x 5 days max and only if you have nasal congestion/stuffy  that prevents flonase getting in and being effective  Follow up with Dr Mark Ward' group  regarding your ongoing symptoms  Please schedule a follow up visit in  6 months but call sooner if needed    10/04/2020  f/u ov/Mark Ward re:  Asthma/ rhinitis / did not see ent Chief Complaint  Patient presents with  . Follow-up    Breathing is overall doing well. He rarely uses his albuterol.   Dyspnea:  Not limited by breathing from desired activities   Cough: assoc with pnds / nasal congestion, still using lots of afrin  Sleeping: no resp symptoms SABA use: rarely 02:  none Covid status:   X 3 vax/ never infected    No obvious day to day or daytime variability or assoc excess/ purulent sputum or mucus plugs or hemoptysis or cp or chest tightness, subjective wheeze or overt sinus or hb symptoms.   sleeping without nocturnal  or early am exacerbation  of respiratory  c/o's or need for noct saba. Also denies any obvious fluctuation of symptoms with weather or environmental changes or other aggravating or alleviating factors except as outlined above   No unusual exposure hx or h/o childhood pna/ asthma or knowledge of premature birth.  Current Allergies, Complete Past Medical History, Past Surgical History, Family  History, and Social History were reviewed in [employer] record.  ROS  The following are not active complaints unless bolded Hoarseness, sore throat, dysphagia, dental problems, itching, sneezing,  nasal congestion or discharge of excess mucus or purulent secretions, ear ache,   fever, chills, sweats, unintended wt loss or wt gain, classically pleuritic or exertional cp,  orthopnea pnd or arm/hand swelling  or leg swelling, presyncope, palpitations, abdominal pain, anorexia, nausea, vomiting, diarrhea  or change in bowel habits or change in bladder habits, change in stools or change in urine, dysuria, hematuria,  rash, arthralgias, visual complaints, headache, numbness, weakness or ataxia or problems with walking or coordination,  change in mood or  memory.        Current Meds  Medication Sig  . albuterol (PROAIR HFA) 108 (90 Base) MCG/ACT inhaler 2 puffs every 4 hours if needed  . Aloe-Sodium Chloride (AYR SALINE NASAL GEL NA) As needed  . Alpha-Lipoic Acid 100 MG TABS Take 1 tablet by mouth daily.  [name] aspirin EC 81 MG tablet Take 81 mg by mouth daily. Swallow whole.  [name] atorvastatin (LIPITOR) 10 MG tablet TAKE 1 TABLET BY MOUTH EVERY DAY  . budesonide-formoterol (SYMBICORT) 80-4.5 MCG/ACT inhaler Inhale 2 puffs into the lungs 2 (two) times daily.  . Cholecalciferol (VITAMIN D) 50 MCG (2000 UT) CAPS Take 1 capsule by mouth daily.  Lovell Sheehan Thistle (LIVER & KIDNEY CLEANSER PO) Take 2 capsules by mouth in the  morning and at bedtime.  . fluticasone (FLONASE) 50 MCG/ACT nasal spray Place 1 spray into both nostrils in the morning and at bedtime.  [name] glucose blood (ONETOUCH VERIO) test strip Use as instructed  . Grape Seed 100 MG CAPS Take 1 capsule by mouth daily.  [name] guaifenesin (HUMIBID E) 400 MG TABS tablet Take 400 mg by mouth every 6 (six) hours as needed.  . Insulin Pen Needle 31G X 5 MM MISC Pt is to check blood sugar before breakfast and before evening meal  .  Lancets (ONETOUCH ULTRASOFT) lancets Use as instructed  . metFORMIN (GLUCOPHAGE) 500 MG tablet TAKE 1 TABLET EVERY MORNING AND 2 TABLETS WITH DINNER  . montelukast (SINGULAIR) 10 MG tablet TAKE 1 TABLET BY MOUTH EVERY DAY  . Multiple Vitamins-Minerals (ZINC PO) Take 1 tablet by mouth daily.  [name] oxymetazoline (AFRIN) 0.05 % nasal spray 2 puffs twice daily x 5 days, off for 2 wks.  . Probiotic Product (PROBIOTIC PO) Take 1 capsule by mouth daily.  . Saline GEL as needed.  . sodium chloride (OCEAN) 0.65 % SOLN nasal spray Place 1 spray into both nostrils as needed for congestion.                    Objective:   Physical Exam      04/05/2020    194 08/28/2019     192  02/27/2019   197 01/02/2013  212  > 02/03/2013  215 > 05/12/2013 211 > 06/24/2013  212>210 07/09/13 > 12/30/2013  213 > 01/21/2014  208 >208 02/05/2014 > 03/26/2014 211 > 05/25/2014 216>211 06/22/2014 > 212 03/18/2015 >  09/09/2015   215 >   212 12/12/2015 > 03/15/2016  212 > 05/25/2016   191 > 09/13/2016  182 > 06/24/2017   173 >  05/30/2018    166 > 08/28/2018  176  Vital signs reviewed  04/05/2020  - Note at rest 02 sats  97% on RA   HEENT : pt wearing mask not removed for exam due to covid -19 concerns.    NECK :  without JVD/Nodes/TM/ nl carotid upstrokes bilaterally   LUNGS: no acc muscle use,  Nl contour chest which is clear to A and P bilaterally without cough on insp or exp maneuvers   CV:  RRR  no s3 or murmur or increase in P2, and no edema   ABD:  soft and nontender with nl inspiratory excursion in the supine position. No bruits or organomegaly appreciated, bowel sounds nl  MS:  Nl gait/ ext warm without deformities, calf tenderness, cyanosis or clubbing No obvious joint restrictions   SKIN: warm and dry without lesions    NEURO:  alert, approp, nl sensorium with  no motor or cerebellar deficits apparent.              Assessment:

## 2020-10-04 NOTE — Assessment & Plan Note (Signed)
Onset 2014   Sinus CT 05/05/13 > Complete opacification of visualized portions of the frontal sinuses and ethmoid sinus air cells.  - rec ENT eval 05/06/2013 > surgery Mark Ward) August 04 2013 > improved - Singulair trial 03/26/2014 >>>  - Allergy profile 03/26/2014 > IgE 77 with neg RAST - referred back to [facility]' group  for epistaxis 05/25/2016   > referred bac 10/04/2020 for ? Rhinitis medicamentosa           Each maintenance medication was reviewed in detail including emphasizing most importantly the difference between maintenance and prns and under what circumstances the prns are to be triggered using an action plan format where appropriate.  Total time for H and P, chart review, counseling, reviewing hfa/ nasal  device(s) and generating customized AVS unique to this office visit / same day charting = 22 min

## 2020-10-10 ENCOUNTER — Other Ambulatory Visit: Payer: Self-pay | Admitting: Family Medicine

## 2020-10-10 DIAGNOSIS — E1165 Type 2 diabetes mellitus with hyperglycemia: Secondary | ICD-10-CM

## 2020-10-24 ENCOUNTER — Other Ambulatory Visit: Payer: Self-pay

## 2020-10-24 ENCOUNTER — Other Ambulatory Visit (HOSPITAL_BASED_OUTPATIENT_CLINIC_OR_DEPARTMENT_OTHER): Payer: Self-pay

## 2020-10-24 ENCOUNTER — Ambulatory Visit: Payer: PRIVATE HEALTH INSURANCE | Attending: Internal Medicine

## 2020-10-24 DIAGNOSIS — Z23 Encounter for immunization: Secondary | ICD-10-CM

## 2020-10-24 MED ORDER — PFIZER-BIONT COVID-19 VAC-TRIS 30 MCG/0.3ML IM SUSP
INTRAMUSCULAR | 0 refills | Status: DC
  Filled 2020-10-24: qty 0.3, 1d supply, fill #0

## 2020-10-24 NOTE — Progress Notes (Signed)
   Covid-19 Vaccination Clinic  Name:  Mark Ward    MRN: [id number] DOB: 15-Nov-1965  10/24/2020  [name] was observed post Covid-19 immunization for 15 minutes without incident. He was provided with Vaccine Information Sheet and instruction to access the V-Safe system.   [name] was instructed to call 911 with any severe reactions post vaccine: Difficulty breathing  Swelling of face and throat  A fast heartbeat  A bad rash all over body  Dizziness and weakness   Immunizations Administered     Name Date Dose VIS Date Route   PFIZER Comrnaty(Gray TOP) Covid-19 Vaccine 10/24/2020 11:12 AM 0.3 mL 04/07/2020 Intramuscular   Manufacturer: [place]   Lot: [id number]   [place]: [phone]

## 2020-12-27 ENCOUNTER — Telehealth: Payer: Self-pay | Admitting: Internal Medicine

## 2020-12-27 MED ORDER — MONTELUKAST SODIUM 10 MG PO TABS: ORAL_TABLET | ORAL | 3 refills | Status: DC

## 2020-12-27 NOTE — Telephone Encounter (Signed)
Called and spoke with Patient.  Patient requested a refill for Singulair to [facility] [place].  Requested prescription sent to requested pharmacy.  Nothing further at this time.

## 2021-01-05 ENCOUNTER — Other Ambulatory Visit: Payer: Self-pay | Admitting: Family Medicine

## 2021-01-05 DIAGNOSIS — E1165 Type 2 diabetes mellitus with hyperglycemia: Secondary | ICD-10-CM

## 2021-03-28 NOTE — Progress Notes (Signed)
HPI: [name] is a 55 y.o.male here today for his routine physical examination.  Last CPE: 2020 He lives with his mother.  Regular exercise 3 or more times per week: Yard work and active at work. He does not have an exercise routine. Following a healthy diet: His mother cooks, he eats some salad sometimes. "A little junk food"a bag of m&m family size.  Chronic medical problems: Asthma,allergic rhinitis,DM II,aortic atherosclerosis, migraine/headaches,and OA.  Immunization History  Administered Date(s) Administered   Influenza, High Dose Seasonal PF 01/23/2018   Influenza,inj,Quad PF,6+ Mos 01/02/2013, 12/30/2013, 03/18/2015, 03/15/2016, 04/16/2017, 02/27/2019, 04/05/2020, 03/08/2021   PFIZER Comirnaty(Gray Top)Covid-19 Tri-Sucrose Vaccine 10/24/2020   PFIZER(Purple Top)SARS-COV-2 Vaccination 07/16/2019, 08/10/2019, 03/22/2020   Pfizer Covid-19 Vaccine Bivalent Booster 10yrs & up 03/20/2021   Pneumococcal Polysaccharide-23 11/03/2018   Tdap 12/25/2012    Health Maintenance  Topic Date Due   OPHTHALMOLOGY EXAM  07/12/2020   Zoster Vaccines- Shingrix (1 of 2) 05/05/2021 (Originally 09/11/2017)   HIV Screening  03/29/2022 (Originally [birth year]/1982)   HEMOGLOBIN A1C  09/26/2021   FOOT EXAM  03/29/2022   URINE MICROALBUMIN  03/29/2022   TETANUS/TDAP  12/26/2022   COLONOSCOPY (Pts 45-30yrs Insurance coverage will need to be confirmed)  01/07/2023   INFLUENZA VACCINE  Completed   COVID-19 Vaccine  Completed   Hepatitis C Screening  Completed   HPV VACCINES  Aged Out   Pneumococcal Vaccine 12-54 Years old  Discontinued   Last prostate ca screening: PSA 0.4 in 05/2020. He follows with urologist regularly, nephrolithiasis.  -Negative for high alcohol intake or tobacco use.  -Concerns and/or follow up today:   Diabetes Mellitus II: 04/2016. - Checking BG at home: Low 100's. - Medications: Metformin 500 mg 1 tab in the morning and 2 tabs in the evening.  Lab Results   Component Value Date   HGBA1C 6.2 09/27/2020   Lab Results  Component Value Date   MICROALBUR 1.0 11/03/2018   Hyperlipidemia: Currently on Atorvastatin 10 mg daily. Lab Results  Component Value Date   CHOL 141 11/03/2018   HDL 46.90 11/03/2018   LDLCALC 66 11/03/2018   TRIG 139.0 11/03/2018   CHOLHDL 3 11/03/2018   Review of Systems  Constitutional:  Negative for activity change, appetite change, fatigue and fever.  HENT:  Negative for mouth sores, nosebleeds, sore throat, trouble swallowing and voice change.   Eyes:  Negative for redness and visual disturbance.  Respiratory:  Negative for cough, shortness of breath and wheezing.   Cardiovascular:  Negative for chest pain, palpitations and leg swelling.  Gastrointestinal:  Negative for abdominal pain, blood in stool, nausea and vomiting.  Endocrine: Negative for cold intolerance, heat intolerance, polydipsia, polyphagia and polyuria.  Genitourinary:  Negative for decreased urine volume, dysuria, genital sores, hematuria and testicular pain.  Musculoskeletal:  Negative for gait problem and myalgias.  Skin:  Negative for color change and rash.  Allergic/Immunologic: Positive for environmental allergies.  Neurological:  Negative for syncope, weakness and headaches.  Hematological:  Negative for adenopathy. Does not bruise/bleed easily.  Psychiatric/Behavioral:  Negative for confusion. The patient is not nervous/anxious.   All other systems reviewed and are negative.  Current Outpatient Medications on File Prior to Visit  Medication Sig Dispense Refill   albuterol (PROAIR HFA) 108 (90 Base) MCG/ACT inhaler 2 puffs every 4 hours if needed 1 Inhaler 1   Aloe-Sodium Chloride (AYR SALINE NASAL GEL NA) As needed     Alpha-Lipoic Acid 100 MG TABS Take 1 tablet by  mouth daily.     arginine 500 MG tablet Take 500 mg by mouth daily.     aspirin EC 81 MG tablet Take 81 mg by mouth daily. Swallow whole.     atorvastatin (LIPITOR) 10 MG  tablet TAKE 1 TABLET BY MOUTH EVERY DAY 90 tablet 3   budesonide-formoterol (SYMBICORT) 80-4.5 MCG/ACT inhaler Inhale 2 puffs into the lungs 2 (two) times daily. 3 each 7   Cholecalciferol (VITAMIN D) 50 MCG (2000 UT) CAPS Take 1 capsule by mouth daily.     CITRULLINE PO Take 1,000 mg by mouth in the morning and at bedtime.     COVID-19 mRNA Vac-TriS, Pfizer, (PFIZER-BIONT COVID-19 VAC-TRIS) SUSP injection Inject into the muscle. 0.3 mL 0   Cranberry-Milk Thistle (LIVER & KIDNEY CLEANSER PO) Take 2 capsules by mouth in the morning and at bedtime.     fluticasone (FLONASE) 50 MCG/ACT nasal spray Place 1 spray into both nostrils in the morning and at bedtime.     glucose blood (ONETOUCH VERIO) test strip Use as instructed 100 each 12   Grape Seed 100 MG CAPS Take 1 capsule by mouth daily.     guaifenesin (HUMIBID E) 400 MG TABS tablet Take 400 mg by mouth every 6 (six) hours as needed.     Insulin Pen Needle 31G X 5 MM MISC Pt is to check blood sugar before breakfast and before evening meal 100 each 3   Lancets (ONETOUCH ULTRASOFT) lancets Use as instructed 100 each 12   metFORMIN (GLUCOPHAGE) 500 MG tablet TAKE 1 TABLET BY MOUTH EVERY MORNING AND 2 TABLETS WITH DINNER 270 tablet 0   montelukast (SINGULAIR) 10 MG tablet TAKE 1 TABLET BY MOUTH EVERY DAY 90 tablet 3   Multiple Vitamins-Minerals (ZINC PO) Take 1 tablet by mouth daily.     oxymetazoline (AFRIN) 0.05 % nasal spray 2 puffs twice daily x 5 days, off for 2 wks.     Probiotic Product (PROBIOTIC PO) Take 1 capsule by mouth daily.     Saline GEL as needed.     sodium chloride (OCEAN) 0.65 % SOLN nasal spray Place 1 spray into both nostrils as needed for congestion.     Tyrosine 500 MG CAPS Take 1,000 mg by mouth.     No current facility-administered medications on file prior to visit.   Past Medical History:  Diagnosis Date   Allergy    Bronchitis    Chronic sinusitis    Diabetes mellitus without complication ([place])    Migraines     Past Surgical History:  Procedure Laterality Date   MOUTH SURGERY     NASAL POLYP SURGERY     No Known Allergies  Family History  Problem Relation Age of Onset   Diabetes Father    Hyperlipidemia Mother    Hypertension Mother    Hypertension Brother    Heart disease Maternal Grandmother    Hyperlipidemia Maternal Grandfather    Hypertension Maternal Grandfather    Diabetes Paternal Grandmother    Heart disease Paternal Grandfather    Colon cancer Neg Hx    Esophageal cancer Neg Hx    Prostate cancer Neg Hx    Rectal cancer Neg Hx     Social History   Socioeconomic History   Marital status: Single    Spouse name: Not on file   Number of children: Not on file   Years of education: Not on file   Highest education level: Not on file  Occupational History  Occupation: Research scientist (physical sciences): [employer]  Tobacco Use   Smoking status: Never   Smokeless tobacco: Never  Vaping Use   Vaping Use: Never used  Substance and Sexual Activity   Alcohol use: No   Drug use: No   Sexual activity: Not Currently  Other Topics Concern   Not on file  Social History Narrative   Not on file   Social Determinants of Health   Financial Resource Strain: Not on file  Food Insecurity: Not on file  Transportation Needs: Not on file  Physical Activity: Not on file  Stress: Not on file  Social Connections: Not on file   Vitals:   03/29/21 0808  BP: 132/82  Pulse: 90  Resp: 16  Temp: 98.2 F (36.8 C)  SpO2: 100%   Body mass index is 30.38 kg/m.  Wt Readings from Last 3 Encounters:  03/29/21 194 lb (88 kg)  10/04/20 190 lb 12.8 oz (86.5 kg)  09/27/20 194 lb 2 oz (88.1 kg)   Physical Exam Vitals and nursing note reviewed.  Constitutional:      General: He is not in acute distress.    Appearance: He is well-developed and well-groomed.  HENT:     Head: Normocephalic and atraumatic.     Right Ear: Tympanic membrane, ear canal and external ear normal.     Left Ear:  Tympanic membrane, ear canal and external ear normal.     Nose: Rhinorrhea present.     Mouth/Throat:     Mouth: Mucous membranes are moist.     Pharynx: Oropharynx is clear.  Eyes:     Extraocular Movements: Extraocular movements intact.     Conjunctiva/sclera: Conjunctivae normal.     Pupils: Pupils are equal, round, and reactive to light.  Neck:     Thyroid: No thyromegaly.     Trachea: No tracheal deviation.  Cardiovascular:     Rate and Rhythm: Normal rate and regular rhythm.     Pulses:          Dorsalis pedis pulses are 2+ on the right side and 2+ on the left side.     Heart sounds: No murmur heard. Pulmonary:     Effort: Pulmonary effort is normal. No respiratory distress.     Breath sounds: Normal breath sounds.  Abdominal:     Palpations: Abdomen is soft. There is no hepatomegaly or mass.     Tenderness: There is no abdominal tenderness.  Genitourinary:    Comments: Deferred to urologist. Musculoskeletal:        General: No tenderness.     Cervical back: Normal range of motion.     Comments: No major deformities appreciated and no signs of synovitis.  Lymphadenopathy:     Cervical: No cervical adenopathy.     Upper Body:     Right upper body: No supraclavicular adenopathy.     Left upper body: No supraclavicular adenopathy.  Skin:    General: Skin is warm.     Findings: No erythema.  Neurological:     General: No focal deficit present.     Mental Status: He is alert and oriented to person, place, and time.     Cranial Nerves: No cranial nerve deficit.     Sensory: No sensory deficit.     Gait: Gait normal.     Deep Tendon Reflexes:     Reflex Scores:      Bicep reflexes are 2+ on the right side and 2+ on the  left side.      Patellar reflexes are 2+ on the right side and 2+ on the left side. Psychiatric:        Mood and Affect: Mood and affect normal.   ASSESSMENT AND PLAN:  [name] was seen today for annual exam.  Diagnoses and all orders for this  visit: Orders Placed This Encounter  Procedures   Hemoglobin A1c   Microalbumin/Creatinine Ratio, Urine   Lipid panel   Hep C Antibody   Comprehensive metabolic panel   Lab Results  Component Value Date   CHOL 127 03/29/2021   HDL 44.90 03/29/2021   LDLCALC 62 03/29/2021   TRIG 99.0 03/29/2021   CHOLHDL 3 03/29/2021   Lab Results  Component Value Date   HGBA1C 6.8 (H) 03/29/2021   Lab Results  Component Value Date   MICROALBUR <0.7 03/29/2021        Lab Results  Component Value Date   ALT 29 03/29/2021   AST 26 03/29/2021   ALKPHOS 68 03/29/2021   BILITOT 0.7 03/29/2021   Lab Results  Component Value Date   CREATININE 0.86 03/29/2021   BUN 15 03/29/2021   NA 138 03/29/2021   K 4.2 03/29/2021   CL 99 03/29/2021   CO2 32 03/29/2021   Routine general medical examination at a health care facility We discussed the importance of regular physical activity and healthy diet for prevention of chronic illness and/or complications. Preventive guidelines reviewed. Vaccination up to date. Next CPE in a year.  Type 2 diabetes mellitus with hyperglycemia, without long-term current use of insulin (HCC) Problem has been well controlled. No changes in current management. Further recommendations according to HgA1C result.  Encounter for hepatitis C screening test for low risk patient -     Hep C Antibody  Screening for lipoid disorders -     Lipid panel  Return in 6 months (on 09/26/2021) for DM II.  Orissa Arreaga G. [place], MD  [facility]. [place] office.

## 2021-03-29 ENCOUNTER — Ambulatory Visit (INDEPENDENT_AMBULATORY_CARE_PROVIDER_SITE_OTHER): Payer: PRIVATE HEALTH INSURANCE | Admitting: Family Medicine

## 2021-03-29 ENCOUNTER — Encounter: Payer: Self-pay | Admitting: Family Medicine

## 2021-03-29 ENCOUNTER — Encounter: Payer: PRIVATE HEALTH INSURANCE | Admitting: Family Medicine

## 2021-03-29 VITALS — BP 132/82 | HR 90 | Temp 98.2°F | Resp 16 | Ht 67.0 in | Wt 194.0 lb

## 2021-03-29 DIAGNOSIS — Z1159 Encounter for screening for other viral diseases: Secondary | ICD-10-CM

## 2021-03-29 DIAGNOSIS — Z13220 Encounter for screening for lipoid disorders: Secondary | ICD-10-CM

## 2021-03-29 DIAGNOSIS — I700 Atherosclerosis of aorta: Secondary | ICD-10-CM

## 2021-03-29 DIAGNOSIS — Z0000 Encounter for general adult medical examination without abnormal findings: Secondary | ICD-10-CM | POA: Diagnosis not present

## 2021-03-29 DIAGNOSIS — E1165 Type 2 diabetes mellitus with hyperglycemia: Secondary | ICD-10-CM | POA: Diagnosis not present

## 2021-03-29 LAB — LIPID PANEL
Cholesterol: 127 mg/dL (ref 0–200)
HDL: 44.9 mg/dL (ref >39.00)
LDL Cholesterol: 62 mg/dL (ref 0–99)
NonHDL: 81.85
Total CHOL/HDL Ratio: 3
Triglycerides: 99 mg/dL (ref 0.0–149.0)
VLDL: 19.8 mg/dL (ref 0.0–40.0)

## 2021-03-29 LAB — COMPREHENSIVE METABOLIC PANEL
ALT: 29 U/L (ref 0–53)
AST: 26 U/L (ref 0–37)
Albumin: 4.5 g/dL (ref 3.5–5.2)
Alkaline Phosphatase: 68 U/L (ref 39–117)
BUN: 15 mg/dL (ref 6–23)
CO2: 32 mEq/L (ref 19–32)
Calcium: 9.8 mg/dL (ref 8.4–10.5)
Chloride: 99 mEq/L (ref 96–112)
Creatinine, Ser: 0.86 mg/dL (ref 0.40–1.50)
GFR: 98.83 mL/min (ref >60.00)
Glucose, Bld: 122 mg/dL — ABNORMAL HIGH (ref 70–99)
Potassium: 4.2 mEq/L (ref 3.5–5.1)
Sodium: 138 mEq/L (ref 135–145)
Total Bilirubin: 0.7 mg/dL (ref 0.2–1.2)
Total Protein: 8 g/dL (ref 6.0–8.3)

## 2021-03-29 LAB — MICROALBUMIN / CREATININE URINE RATIO
Creatinine,U: 61 mg/dL
Microalb Creat Ratio: 1.1 mg/g (ref 0.0–30.0)
Microalb, Ur: <0.7 mg/dL (ref 0.0–1.9)

## 2021-03-29 LAB — HEMOGLOBIN A1C: Hgb A1c MFr Bld: 6.8 % — ABNORMAL HIGH (ref 4.6–6.5)

## 2021-03-29 NOTE — Patient Instructions (Addendum)
A few things to remember from today's visit:  Routine general medical examination at a health care facility  Type 2 diabetes mellitus with hyperglycemia, without long-term current use of insulin ([place])  Atherosclerosis of aorta (HCC)  Encounter for hepatitis C screening test for low risk patient  Screening for lipoid disorders  If you need refills please call your pharmacy. Do not use My Chart to request refills or for acute issues that need immediate attention.   Please be sure medication list is accurate. If a new problem present, please set up appointment sooner than planned today.  Health Maintenance, Male Adopting a healthy lifestyle and getting preventive care are important in promoting health and wellness. Ask your health care provider about: The right schedule for you to have regular tests and exams. Things you can do on your own to prevent diseases and keep yourself healthy. What should I know about diet, weight, and exercise? Eat a healthy diet  Eat a diet that includes plenty of vegetables, fruits, low-fat dairy products, and lean protein. Do not eat a lot of foods that are high in solid fats, added sugars, or sodium. Maintain a healthy weight Body mass index (BMI) is a measurement that can be used to identify possible weight problems. It estimates body fat based on height and weight. Your health care provider can help determine your BMI and help you achieve or maintain a healthy weight. Get regular exercise Get regular exercise. This is one of the most important things you can do for your health. Most adults should: Exercise for at least 150 minutes each week. The exercise should increase your heart rate and make you sweat (moderate-intensity exercise). Do strengthening exercises at least twice a week. This is in addition to the moderate-intensity exercise. Spend less time sitting. Even light physical activity can be beneficial. Watch cholesterol and blood lipids Have  your blood tested for lipids and cholesterol at 55 years of age, then have this test every 5 years. You may need to have your cholesterol levels checked more often if: Your lipid or cholesterol levels are high. You are older than 53 years of age. You are at high risk for heart disease. What should I know about cancer screening? Many types of cancers can be detected early and may often be prevented. Depending on your health history and family history, you may need to have cancer screening at various ages. This may include screening for: Colorectal cancer. Prostate cancer. Skin cancer. Lung cancer. What should I know about heart disease, diabetes, and high blood pressure? Blood pressure and heart disease High blood pressure causes heart disease and increases the risk of stroke. This is more likely to develop in people who have high blood pressure readings or are overweight. Talk with your health care provider about your target blood pressure readings. Have your blood pressure checked: Every 3-5 years if you are 32-19 years of age. Every year if you are 76 years old or older. If you are between the ages of 38 and 66 and are a current or former smoker, ask your health care provider if you should have a one-time screening for abdominal aortic aneurysm (AAA). Diabetes Have regular diabetes screenings. This checks your fasting blood sugar level. Have the screening done: Once every three years after age 69 if you are at a normal weight and have a low risk for diabetes. More often and at a younger age if you are overweight or have a high risk for diabetes. What should  I know about preventing infection? Hepatitis B If you have a higher risk for hepatitis B, you should be screened for this virus. Talk with your health care provider to find out if you are at risk for hepatitis B infection. Hepatitis C Blood testing is recommended for: Everyone born from [birth year] through 1963. Anyone with known risk  factors for hepatitis C. Sexually transmitted infections (STIs) You should be screened each year for STIs, including gonorrhea and chlamydia, if: You are sexually active and are younger than 53 years of age. You are older than 53 years of age and your health care provider tells you that you are at risk for this type of infection. Your sexual activity has changed since you were last screened, and you are at increased risk for chlamydia or gonorrhea. Ask your health care provider if you are at risk. Ask your health care provider about whether you are at high risk for HIV. Your health care provider may recommend a prescription medicine to help prevent HIV infection. If you choose to take medicine to prevent HIV, you should first get tested for HIV. You should then be tested every 3 months for as long as you are taking the medicine. Follow these instructions at home: Alcohol use Do not drink alcohol if your health care provider tells you not to drink. If you drink alcohol: Limit how much you have to 0-2 drinks a day. Know how much alcohol is in your drink. In the [place] one drink equals one 12 oz bottle of beer (355 mL), one 5 oz glass of wine (148 mL), or one 1 oz glass of hard liquor (44 mL). Lifestyle Do not use any products that contain nicotine or tobacco. These products include cigarettes, chewing tobacco, and vaping devices, such as e-cigarettes. If you need help quitting, ask your health care provider. Do not use street drugs. Do not share needles. Ask your health care provider for help if you need support or information about quitting drugs. General instructions Schedule regular health, dental, and eye exams. Stay current with your vaccines. Tell your health care provider if: You often feel depressed. You have ever been abused or do not feel safe at home. Summary Adopting a healthy lifestyle and getting preventive care are important in promoting health and wellness. Follow your health  care provider's instructions about healthy diet, exercising, and getting tested or screened for diseases. Follow your health care provider's instructions on monitoring your cholesterol and blood pressure. This information is not intended to replace advice given to you by your health care provider. Make sure you discuss any questions you have with your health care provider. Document Revised: 09/05/2020 Document Reviewed: 09/05/2020 Elsevier Patient Education  [place].

## 2021-03-30 LAB — HEPATITIS C ANTIBODY
Hepatitis C Ab: NONREACTIVE
SIGNAL TO CUT-OFF: 0.04 (ref <1.00)

## 2021-03-30 MED ORDER — METFORMIN HCL 500 MG PO TABS: ORAL_TABLET | ORAL | 1 refills | Status: DC

## 2021-03-30 MED ORDER — ATORVASTATIN CALCIUM 10 MG PO TABS: 10.0000 mg | ORAL_TABLET | Freq: Every day | ORAL | 3 refills | Status: DC

## 2021-09-01 ENCOUNTER — Other Ambulatory Visit: Payer: Self-pay | Admitting: Internal Medicine

## 2021-09-22 NOTE — Progress Notes (Unsigned)
HPI: [name] is a 56 y.o. male, who is here today for follow up. He was last seen on 03/29/21.  Diabetes Mellitus II: Dx'ed in 04/2016. - Checking BG at home: 130's - Medications: Metformin 500 mg 1 tab in the morning & 2 at dinner - Diet: Home made meals, in general he follows a healthful diet. - Exercise: Not consistently, he has been busy at work. - eye exam: Due - foot exam: UTD - Negative for symptoms of hypoglycemia, polyuria, polydipsia, numbness extremities, foot ulcers/trauma  Lab Results  Component Value Date   HGBA1C 6.8 (H) 03/29/2021   Lab Results  Component Value Date   MICROALBUR <0.7 03/29/2021   Aortic atherosclerosis: Seen on imaging in 04/2020, renal CT. He is on Atorvastatin 10 mg daily. Lab Results  Component Value Date   CHOL 127 03/29/2021   HDL 44.90 03/29/2021   LDLCALC 62 03/29/2021   TRIG 99.0 03/29/2021   CHOLHDL 3 03/29/2021   Review of Systems  Constitutional:  Negative for activity change, appetite change, fatigue and fever.  HENT:  Negative for nosebleeds and sore throat.   Eyes:  Negative for redness and visual disturbance.  Respiratory:  Negative for cough and shortness of breath.   Cardiovascular:  Negative for chest pain, palpitations and leg swelling.  Gastrointestinal:  Negative for abdominal pain, nausea and vomiting.  Genitourinary:  Negative for decreased urine volume, dysuria and hematuria.  Neurological:  Negative for syncope, weakness and headaches.  Rest see pertinent positives and negatives per HPI.  Current Outpatient Medications on File Prior to Visit  Medication Sig Dispense Refill   albuterol (PROAIR HFA) 108 (90 Base) MCG/ACT inhaler 2 puffs every 4 hours if needed 1 Inhaler 1   Aloe-Sodium Chloride (AYR SALINE NASAL GEL NA) As needed     Alpha-Lipoic Acid 100 MG TABS Take 1 tablet by mouth daily.     arginine 500 MG tablet Take 500 mg by mouth daily.     aspirin EC 81 MG tablet Take 81 mg by mouth  daily. Swallow whole.     atorvastatin (LIPITOR) 10 MG tablet Take 1 tablet (10 mg total) by mouth daily. 90 tablet 3   budesonide-formoterol (SYMBICORT) 80-4.5 MCG/ACT inhaler INHALE 2 PUFFS INTO THE LUNGS TWICE A DAY 10.2 each 2   Cholecalciferol (VITAMIN D) 50 MCG (2000 UT) CAPS Take 1 capsule by mouth daily.     CITRULLINE PO Take 1,000 mg by mouth in the morning and at bedtime.     COVID-19 mRNA Vac-TriS, Pfizer, (PFIZER-BIONT COVID-19 VAC-TRIS) SUSP injection Inject into the muscle. 0.3 mL 0   Cranberry-Milk Thistle (LIVER & KIDNEY CLEANSER PO) Take 2 capsules by mouth in the morning and at bedtime.     fluticasone (FLONASE) 50 MCG/ACT nasal spray Place 1 spray into both nostrils in the morning and at bedtime.     glucose blood (ONETOUCH VERIO) test strip Use as instructed 100 each 12   Grape Seed 100 MG CAPS Take 1 capsule by mouth daily.     guaifenesin (HUMIBID E) 400 MG TABS tablet Take 400 mg by mouth every 6 (six) hours as needed.     Insulin Pen Needle 31G X 5 MM MISC Pt is to check blood sugar before breakfast and before evening meal 100 each 3   Lancets (ONETOUCH ULTRASOFT) lancets Use as instructed 100 each 12   metFORMIN (GLUCOPHAGE) 500 MG tablet TAKE 1 TABLET BY MOUTH EVERY MORNING AND 2 TABLETS WITH DINNER  270 tablet 1   montelukast (SINGULAIR) 10 MG tablet TAKE 1 TABLET BY MOUTH EVERY DAY 90 tablet 3   Multiple Vitamins-Minerals (ZINC PO) Take 1 tablet by mouth daily.     oxymetazoline (AFRIN) 0.05 % nasal spray 2 puffs twice daily x 5 days, off for 2 wks.     Probiotic Product (PROBIOTIC PO) Take 1 capsule by mouth daily.     Saline GEL as needed.     sodium chloride (OCEAN) 0.65 % SOLN nasal spray Place 1 spray into both nostrils as needed for congestion.     Tyrosine 500 MG CAPS Take 1,000 mg by mouth.     No current facility-administered medications on file prior to visit.    Past Medical History:  Diagnosis Date   Allergy    Bronchitis    Chronic sinusitis     Diabetes mellitus without complication (HCC)    Migraines    No Known Allergies  Social History   Socioeconomic History   Marital status: Single    Spouse name: Not on file   Number of children: Not on file   Years of education: Not on file   Highest education level: Not on file  Occupational History   Occupation: Customer Service    Employer: [employer]  Tobacco Use   Smoking status: Never   Smokeless tobacco: Never  Vaping Use   Vaping Use: Never used  Substance and Sexual Activity   Alcohol use: No   Drug use: No   Sexual activity: Not Currently  Other Topics Concern   Not on file  Social History Narrative   Not on file   Social Determinants of Health   Financial Resource Strain: Not on file  Food Insecurity: Not on file  Transportation Needs: Not on file  Physical Activity: Not on file  Stress: Not on file  Social Connections: Not on file   Vitals:   09/26/21 1503  BP: 128/80  Pulse: 100  Resp: 16  SpO2: 98%  Body mass index is 29.52 kg/m.  Physical Exam Vitals and nursing note reviewed.  Constitutional:      General: He is not in acute distress.    Appearance: He is well-developed.  HENT:     Head: Normocephalic and atraumatic.     Mouth/Throat:     Mouth: Mucous membranes are moist.     Pharynx: Oropharynx is clear.  Eyes:     Conjunctiva/sclera: Conjunctivae normal.  Cardiovascular:     Rate and Rhythm: Normal rate and regular rhythm.     Pulses:          Dorsalis pedis pulses are 2+ on the right side and 2+ on the left side.     Heart sounds: No murmur heard. Pulmonary:     Effort: Pulmonary effort is normal. No respiratory distress.     Breath sounds: Normal breath sounds.  Abdominal:     Palpations: Abdomen is soft. There is no hepatomegaly or mass.     Tenderness: There is no abdominal tenderness.  Lymphadenopathy:     Cervical: No cervical adenopathy.  Skin:    General: Skin is warm.     Findings: No erythema or rash.  Neurological:      Mental Status: He is alert and oriented to person, place, and time.     Cranial Nerves: No cranial nerve deficit.     Gait: Gait normal.  Psychiatric:     Comments: Well groomed, good eye contact.   ASSESSMENT AND  PLAN:  [name] was seen today for follow-up.  Diagnoses and all orders for this visit:   Atherosclerosis of aorta [facility]) We discussed finding on renal CT done in 04/2020. Continue Atorvastatin 10 mg daily and Aspirin 81 mg daily.  Type 2 diabetes mellitus with hyperglycemia (HCC) HgA1C at goal., it went from 6.8 to 6.1. Continue Metformin 500 mg Am and 1000 mg pm with meals.  Regular exercise and healthy diet with avoidance of added sugar food intake is an important part of treatment and encouraged. Annual eye exam, periodic dental and foot care recommended. F/U in 5-6 months  Need for shingles vaccine -     Varicella-zoster vaccine IM  Return in about 27 weeks (around 04/03/2022) for CPE.  Sherma Vanmetre G. [place], MD  [facility] [place] office.

## 2021-09-26 ENCOUNTER — Encounter: Payer: Self-pay | Admitting: Family Medicine

## 2021-09-26 ENCOUNTER — Ambulatory Visit (INDEPENDENT_AMBULATORY_CARE_PROVIDER_SITE_OTHER): Payer: PRIVATE HEALTH INSURANCE | Admitting: Family Medicine

## 2021-09-26 VITALS — BP 128/80 | HR 100 | Resp 16 | Ht 67.0 in | Wt 188.5 lb

## 2021-09-26 DIAGNOSIS — I700 Atherosclerosis of aorta: Secondary | ICD-10-CM | POA: Diagnosis not present

## 2021-09-26 DIAGNOSIS — E1165 Type 2 diabetes mellitus with hyperglycemia: Secondary | ICD-10-CM

## 2021-09-26 DIAGNOSIS — Z23 Encounter for immunization: Secondary | ICD-10-CM | POA: Diagnosis not present

## 2021-09-26 LAB — POCT GLYCOSYLATED HEMOGLOBIN (HGB A1C): HbA1c, POC (prediabetic range): 6.1 % (ref 5.7–6.4)

## 2021-09-26 NOTE — Assessment & Plan Note (Signed)
We discussed finding on renal CT done in 04/2020. Continue Atorvastatin 10 mg daily and Aspirin 81 mg daily.

## 2021-09-26 NOTE — Assessment & Plan Note (Signed)
HgA1C at goal., it went from 6.8 to 6.1. Continue Metformin 500 mg Am and 1000 mg pm with meals.  Regular exercise and healthy diet with avoidance of added sugar food intake is an important part of treatment and encouraged. Annual eye exam, periodic dental and foot care recommended. F/U in 5-6 months

## 2021-09-26 NOTE — Patient Instructions (Addendum)
A few things to remember from today's visit:  Type 2 diabetes mellitus with hyperglycemia, without long-term current use of insulin ([place]) - Plan: POC HgB A1c  Atherosclerosis of aorta ([place]), Chronic  If you need refills please call your pharmacy. Do not use My Chart to request refills or for acute issues that need immediate attention.   No changes today. Fasting labs next visit.  Please be sure medication list is accurate. If a new problem present, please set up appointment sooner than planned today.

## 2021-09-28 MED ORDER — METFORMIN HCL 500 MG PO TABS: ORAL_TABLET | ORAL | 2 refills | Status: DC

## 2021-10-02 ENCOUNTER — Telehealth: Payer: Self-pay | Admitting: Internal Medicine

## 2021-10-03 NOTE — Telephone Encounter (Signed)
Dr. Melvyn Novas can send a refill at the [facility]. 10/04/21.

## 2021-10-04 ENCOUNTER — Encounter: Payer: Self-pay | Admitting: Internal Medicine

## 2021-10-04 ENCOUNTER — Ambulatory Visit (INDEPENDENT_AMBULATORY_CARE_PROVIDER_SITE_OTHER): Payer: PRIVATE HEALTH INSURANCE | Admitting: Internal Medicine

## 2021-10-04 DIAGNOSIS — J45909 Unspecified asthma, uncomplicated: Secondary | ICD-10-CM | POA: Diagnosis not present

## 2021-10-04 DIAGNOSIS — J310 Chronic rhinitis: Secondary | ICD-10-CM

## 2021-10-04 MED ORDER — MOMETASONE FURO-FORMOTEROL FUM 100-5 MCG/ACT IN AERO: INHALATION_SPRAY | RESPIRATORY_TRACT | 11 refills | Status: DC

## 2021-10-04 NOTE — Patient Instructions (Addendum)
Dulera 100  = Symbicort 80  either one is fine, not both, and ok  just 2 puffs in am and and leave off the pm doses as long as doing great and needing albuterol  See calendar for specific medication instructions and bring it back for each and every office visit for every healthcare provider you see.  Without it,  you may not receive the best quality medical care that we feel you deserve.  You will note that the calendar groups together  your maintenance  medications that are timed at particular times of the day.  Think of this as your checklist for what your doctor has instructed you to do until your next evaluation to see what benefit  there is  to staying on a consistent group of medications intended to keep you well.  The other group at the bottom is entirely up to you to use as you see fit  for specific symptoms that may arise between visits that require you to treat them on an as needed basis.  Think of this as your action plan or "what if" list.   Separating the top medications from the bottom group is fundamental to providing you adequate care going forward.    See your ENT with all your sinus meds you are taking as soon as you can    Please schedule a follow up visit in 12 months but call sooner if needed

## 2021-10-04 NOTE — Assessment & Plan Note (Addendum)
Onset 2014   Sinus CT 05/05/13 > Complete opacification of visualized portions of the frontal sinuses and ethmoid sinus air cells.  - rec ENT eval 05/06/2013 > surgery [name]) August 04 2013 > improved - Singulair trial 03/26/2014 >>>  - Allergy profile 03/26/2014 > IgE 77 with neg RAST - referred back to [facility] group  for epistaxis 05/25/2016   > referred bac 10/04/2020 for ? Rhinitis medicamentosa   Severe rhinitis on exam today and may be overusing vasoactive nasal sprays again so advised return to see ent asap with all active nasal meds in hand including all otcs  F/u in pulmonary clinic yearly, sooner if needed          Each maintenance medication was reviewed in detail including emphasizing most importantly the difference between maintenance and prns and under what circumstances the prns are to be triggered using an action plan format where appropriate.  Total time for H and P, chart review, counseling, reviewing hfa/nasal device(s) and generating customized AVS unique to this office visit / same day charting >  30 min yearly asthma review.

## 2021-10-04 NOTE — Assessment & Plan Note (Signed)
Onset 2014 with upper airway component  - PFT's 01/02/2013 wnl including fef 25-75 p am dulera 100 x 2 pffs  - FENO 09/13/2016  =   29 on symb 160 x 2 bid but has aodm so try symb 80 2bid  - Allergy profile 01/23/2018 >  Eos 0.1 /  IgE  188 RAST neg  - 10/04/2021  After extensive coaching inhaler device,  effectiveness =    90%  Continue symbicort 80or dulera 100 and if problems with cost try using the pm dose prn    Based on two studies from [place]  378; 20 p 1865 (2018) and 380 : p2020-30 (2019) in pts with mild asthma it is reasonable to use low dose symbicort eg 80 (and by inference dulea 100)  2bid "prn" flare in this setting but I emphasized this was only shown with symbicort and takes advantage of the rapid onset of action but is not the same as "rescue therapy" but can be stopped once the acute symptoms have resolved and the need for rescue has been minimized (< 2 x weekly)

## 2021-10-04 NOTE — Progress Notes (Signed)
Subjective:    Patient ID: Mark Ward, male   DOB: [birth year]   MRN: [id number]  Brief patient profile:  89   yobm never smoker never significant allergies or asthma with new onset cough Feb 2014 variably improved then relapsed seen by ent x 3 by UC x3 2, x ER x 3 and finally admitted:   Admit date: 11/24/2012  Discharge date: 11/26/2012  Discharge Diagnoses:  Moderate persistent asthma with exacerbation     07/09/2013 NP  Folllow up and Med review  Returns for follow up and med review .  Reports breathing is doing well since last ov.  feels symptoms are more related to molds at work No SABA use.  Insurance will cover [employer].  Reviewed all his meds and organized them into a med calendar closely w/ pt education . Is following with ENT > sinus surgery 08/04/13 [name]  rec Follow med calendar closely and bring to each visit. Continue on current regimen. Make sure to brush rinse and gargle after inhaler use. Continue followup with ENT     10/04/2020  f/u ov/Mark Ward re:  Asthma/ rhinitis   Chief Complaint  Patient presents with   Follow-up    Breathing is overall doing well. He rarely uses his albuterol.   Dyspnea:  Not limited by breathing from desired activities   Cough: assoc with pnds / nasal congestion, still using lots of afrin  Sleeping: no resp symptoms SABA use: rarely 02:  none Covid status:   X 3 vax/ never infected  Rec Ent f/u [name]    10/04/2021  f/u ov/Mark Ward re: asthma    maint on symbicort but out x one month  still on singulair/ no recent ent f/u Chief Complaint  Patient presents with   Follow-up    Pt states he has been doing okay since last visit and denies any real complaints.   Dyspnea:  Not limited by breathing from desired activities   Cough: none/ nasal co's feels some  better with otcs but not using flonase  - denies over using afrin/ neo Sleeping: fine bed is flat, 2 pillows SABA use: none while on symb 02: none  Covid status:   vax x 5    No  obvious day to day or daytime variability or assoc excess/ purulent sputum or mucus plugs or hemoptysis or cp or chest tightness, subjective wheeze or overt sinus or hb symptoms.   sleeping without nocturnal  or early am exacerbation  of respiratory  c/o's or need for noct saba. Also denies any obvious fluctuation of symptoms with weather or environmental changes or other aggravating or alleviating factors except as outlined above   No unusual exposure hx or h/o childhood pna/ asthma or knowledge of premature birth.  Current Allergies, Complete Past Medical History, Past Surgical History, Family History, and Social History were reviewed in [employer] record.  ROS  The following are not active complaints unless bolded Hoarseness, sore throat, dysphagia, dental problems, itching, sneezing,  nasal congestion or discharge of excess mucus or purulent secretions, ear ache,   fever, chills, sweats, unintended wt loss or wt gain, classically pleuritic or exertional cp,  orthopnea pnd or arm/hand swelling  or leg swelling, presyncope, palpitations, abdominal pain, anorexia, nausea, vomiting, diarrhea  or change in bowel habits or change in bladder habits, change in stools or change in urine, dysuria, hematuria,  rash, arthralgias, visual complaints, headache, numbness, weakness or ataxia or problems with walking or coordination,  change in mood  or  memory.        Current Meds  Medication Sig   albuterol (PROAIR HFA) 108 (90 Base) MCG/ACT inhaler 2 puffs every 4 hours if needed   Aloe-Sodium Chloride (AYR SALINE NASAL GEL NA) As needed   Alpha-Lipoic Acid 100 MG TABS Take 1 tablet by mouth daily.   arginine 500 MG tablet Take 500 mg by mouth daily.   aspirin EC 81 MG tablet Take 81 mg by mouth daily. Swallow whole.   atorvastatin (LIPITOR) 10 MG tablet Take 1 tablet (10 mg total) by mouth daily.   budesonide-formoterol (SYMBICORT) 80-4.5 MCG/ACT inhaler INHALE 2 PUFFS INTO THE LUNGS  TWICE A DAY   Cholecalciferol (VITAMIN D) 50 MCG (2000 UT) CAPS Take 1 capsule by mouth daily.   CITRULLINE PO Take 1,000 mg by mouth in the morning and at bedtime.   Cranberry-Milk Thistle (LIVER & KIDNEY CLEANSER PO) Take 2 capsules by mouth in the morning and at bedtime.   fluticasone (FLONASE) 50 MCG/ACT nasal spray Place 1 spray into both nostrils in the morning and at bedtime.   glucose blood (ONETOUCH VERIO) test strip Use as instructed   Grape Seed 100 MG CAPS Take 1 capsule by mouth daily.   guaifenesin (HUMIBID E) 400 MG TABS tablet Take 400 mg by mouth every 6 (six) hours as needed.   Insulin Pen Needle 31G X 5 MM MISC Pt is to check blood sugar before breakfast and before evening meal   Lancets (ONETOUCH ULTRASOFT) lancets Use as instructed   metFORMIN (GLUCOPHAGE) 500 MG tablet TAKE 1 TABLET BY MOUTH EVERY MORNING AND 2 TABLETS WITH DINNER   montelukast (SINGULAIR) 10 MG tablet TAKE 1 TABLET BY MOUTH EVERY DAY   Multiple Vitamins-Minerals (ZINC PO) Take 1 tablet by mouth daily.   oxymetazoline (AFRIN) 0.05 % nasal spray 2 puffs twice daily x 5 days, off for 2 wks.   Probiotic Product (PROBIOTIC PO) Take 1 capsule by mouth daily.   Saline GEL as needed.   sodium chloride (OCEAN) 0.65 % SOLN nasal spray Place 1 spray into both nostrils as needed for congestion.   Tyrosine 500 MG CAPS Take 1,000 mg by mouth.                  Objective:   Physical Exam   wts  10/04/2021       192   04/05/2020    194 08/28/2019     192  02/27/2019   197 01/02/2013  212  > 02/03/2013  215 > 05/12/2013 211 > 06/24/2013  212>210 07/09/13 > 12/30/2013  213 > 01/21/2014  208 >208 02/05/2014 > 03/26/2014 211 > 05/25/2014 216>211 06/22/2014 > 212 03/18/2015 >  09/09/2015   215 >   212 12/12/2015 > 03/15/2016  212 > 05/25/2016   191 > 09/13/2016  182 > 06/24/2017   173 >  05/30/2018    166 > 08/28/2018  176   Vital signs reviewed  10/04/2021  - Note at rest 02 sats  100% on RA   General appearance:    amb bm nasal  tone to voice   HEENT : Oropharynx  nl     Nasal turbinates severe TE with mp secretions L >R     NECK :  without  appent JVD/ palpable Nodes/TM    LUNGS: no acc muscle use,  Nl contour chest which is clear to A and P bilaterally without cough on insp or exp maneuvers   CV:  RRR  no s3 or murmur or increase in P2, and no edema   ABD:  soft and nontender with nl inspiratory excursion in the supine position. No bruits or organomegaly appreciated   MS:  Nl gait/ ext warm without deformities Or obvious joint restrictions  calf tenderness, cyanosis or clubbing    SKIN: warm and dry without lesions    NEURO:  alert, approp, nl sensorium with  no motor or cerebellar deficits apparent.                 Assessment:

## 2021-12-27 ENCOUNTER — Other Ambulatory Visit: Payer: Self-pay | Admitting: Urology

## 2021-12-27 DIAGNOSIS — R3121 Asymptomatic microscopic hematuria: Secondary | ICD-10-CM

## 2021-12-28 ENCOUNTER — Other Ambulatory Visit: Payer: Self-pay | Admitting: *Deleted

## 2021-12-28 DIAGNOSIS — J45909 Unspecified asthma, uncomplicated: Secondary | ICD-10-CM

## 2021-12-28 MED ORDER — MONTELUKAST SODIUM 10 MG PO TABS: ORAL_TABLET | ORAL | 3 refills | Status: AC

## 2022-01-18 ENCOUNTER — Other Ambulatory Visit: Payer: PRIVATE HEALTH INSURANCE

## 2022-01-19 ENCOUNTER — Ambulatory Visit
Admission: RE | Admit: 2022-01-19 | Discharge: 2022-01-19 | Disposition: A | Discharge status: Home / Self Care | Payer: Self-pay | Source: Ambulatory Visit | Attending: Urology | Admitting: Urology

## 2022-01-19 DIAGNOSIS — R3121 Asymptomatic microscopic hematuria: Secondary | ICD-10-CM

## 2022-01-19 MED ORDER — IOPAMIDOL (ISOVUE-300) INJECTION 61%
100.0000 mL | Freq: Once | INTRAVENOUS | Status: AC | PRN
  Administered 2022-01-19: 100 mL via INTRAVENOUS

## 2022-01-25 ENCOUNTER — Telehealth: Payer: Self-pay | Admitting: *Deleted

## 2022-01-25 DIAGNOSIS — J45909 Unspecified asthma, uncomplicated: Secondary | ICD-10-CM

## 2022-01-25 NOTE — Telephone Encounter (Signed)
Patient called to see if he could switch his medication from [facility] to [facility] due to price difference. Patient uses the [facility] on [address]  Please call patient when script is in.

## 2022-01-26 MED ORDER — MOMETASONE FURO-FORMOTEROL FUM 100-5 MCG/ACT IN AERO: INHALATION_SPRAY | RESPIRATORY_TRACT | 11 refills | Status: DC

## 2022-01-26 NOTE — Addendum Note (Signed)
Addended by: Gavin Potters R on: 01/26/2022 05:05 PM   Modules accepted: Orders

## 2022-01-26 NOTE — Telephone Encounter (Signed)
Dr. Melvyn Novas, please advise if you are ok with him switching to Symbicort from [facility]. Thanks!

## 2022-01-26 NOTE — Telephone Encounter (Signed)
Spoke with pt and placed [facility] order. Nothing further needed at this time.

## 2022-01-26 NOTE — Telephone Encounter (Signed)
Dulera 100 Take 2 puffs first thing in am and then another 2 puffs about 12 hours later.

## 2022-04-02 NOTE — Progress Notes (Unsigned)
HPI: [name] is a 56 y.o.male  with PMHx significant for DM II,asthma,and aortic atherosclerosis here today for his routine physical examination.  Last CPE: 03/29/2021 He reports no new problems since his last visit.  He has been exercising regularly, attending the gym twice a week, where he engages in treadmill workouts and weightlifting.  Regarding his diet, he cooks at home on Fridays, primarily chicken pasta with spices, and consumes smaller portions of starches when his mother cooks. He admits to not eating vegetables daily but tries to incorporate them into his meals.  Immunization History  Administered Date(s) Administered   COVID-19, mRNA, vaccine(Comirnaty)12 years and older 03/12/2022   Influenza, High Dose Seasonal PF 01/23/2018   Influenza,inj,Quad PF,6+ Mos 01/02/2013, 12/30/2013, 03/18/2015, 03/15/2016, 04/16/2017, 02/27/2019, 04/05/2020, 03/08/2021, 03/12/2022   PFIZER Comirnaty(Gray Top)Covid-19 Tri-Sucrose Vaccine 10/24/2020   PFIZER(Purple Top)SARS-COV-2 Vaccination 07/16/2019, 08/10/2019, 03/22/2020   Pfizer Covid-19 Vaccine Bivalent Booster 13yr & up 03/20/2021   Pneumococcal Polysaccharide-23 11/03/2018   Tdap 12/25/2012   Zoster Recombinat (Shingrix) 09/26/2021, 04/03/2022    Health Maintenance  Topic Date Due   OPHTHALMOLOGY EXAM  07/12/2020   Diabetic kidney evaluation - GFR measurement  03/29/2022   Diabetic kidney evaluation - Urine ACR  03/29/2022   HEMOGLOBIN A1C  03/29/2022   HIV Screening  04/04/2027 (Originally 09/12/1980)   DTaP/Tdap/Td (2 - Td or Tdap) 12/26/2022   COLONOSCOPY (Pts [id number] coverage will need to be confirmed)  01/07/2023   FOOT EXAM  04/04/2023   INFLUENZA VACCINE  Completed   COVID-19 Vaccine  Completed   Hepatitis C Screening  Completed   Zoster Vaccines- Shingrix  Completed   HPV VACCINES  Aged Out   Last prostate ca screening: PSA 0.2 at his urologist's office.  -Negative for high alcohol intake or  tobacco use.  He estimates his average blood sugar to be around 130.  DM II: He is currently taking Lipitor '10mg'$  and metformin '500mg'$  (one tablet in the morning and two tablets at night).  He received his flu shot a couple of weeks ago and had a colonoscopy in 2019, with a recommended follow-up in 5 years. He sees pulmonologist for his asthma and recently saw ENT for epistaxis and sinus problems.Symptoms have improved.  Lab Results  Component Value Date   HGBA1C 6.1 09/26/2021   Lab Results  Component Value Date   MICROALBUR <0.7 03/29/2021   MICROALBUR 1.0 11/03/2018   Review of Systems  Constitutional:  Negative for activity change, appetite change and fever.  HENT:  Negative for nosebleeds, sore throat, trouble swallowing and voice change.   Eyes:  Negative for redness and visual disturbance.  Respiratory:  Negative for cough, shortness of breath and wheezing.   Cardiovascular:  Negative for chest pain, palpitations and leg swelling.  Gastrointestinal:  Negative for abdominal pain, blood in stool, nausea and vomiting.  Endocrine: Negative for cold intolerance, heat intolerance, polydipsia, polyphagia and polyuria.  Genitourinary:  Negative for decreased urine volume, dysuria, genital sores, hematuria and testicular pain.  Musculoskeletal:  Negative for gait problem and myalgias.  Skin:  Negative for color change and rash.  Allergic/Immunologic: Positive for environmental allergies.  Neurological:  Negative for seizures, syncope, weakness and headaches.  Hematological:  Negative for adenopathy. Does not bruise/bleed easily.  Psychiatric/Behavioral:  Negative for confusion. The patient is not nervous/anxious.    Current Outpatient Medications on File Prior to Visit  Medication Sig Dispense Refill   albuterol (PROAIR HFA) 108 (90 Base) MCG/ACT inhaler 2  puffs every 4 hours if needed 1 Inhaler 1   Aloe-Sodium Chloride (AYR SALINE NASAL GEL NA) As needed     Alpha-Lipoic Acid 100 MG  TABS Take 1 tablet by mouth daily.     aspirin EC 81 MG tablet Take 81 mg by mouth daily. Swallow whole.     atorvastatin (LIPITOR) 10 MG tablet Take 1 tablet (10 mg total) by mouth daily. 90 tablet 3   budesonide-formoterol (SYMBICORT) 80-4.5 MCG/ACT inhaler INHALE 2 PUFFS INTO THE LUNGS TWICE A DAY 10.2 each 2   Cholecalciferol (VITAMIN D) 50 MCG (2000 UT) CAPS Take 1 capsule by mouth daily.     Cranberry-Milk Thistle (LIVER & KIDNEY CLEANSER PO) Take 2 capsules by mouth in the morning and at bedtime.     fluticasone (FLONASE) 50 MCG/ACT nasal spray Place 1 spray into both nostrils in the morning and at bedtime.     glucose blood (ONETOUCH VERIO) test strip Use as instructed 100 each 12   guaifenesin (HUMIBID E) 400 MG TABS tablet Take 400 mg by mouth every 6 (six) hours as needed.     Insulin Pen Needle 31G X 5 MM MISC Pt is to check blood sugar before breakfast and before evening meal 100 each 3   Lancets (ONETOUCH ULTRASOFT) lancets Use as instructed 100 each 12   metFORMIN (GLUCOPHAGE) 500 MG tablet TAKE 1 TABLET BY MOUTH EVERY MORNING AND 2 TABLETS WITH DINNER 270 tablet 2   montelukast (SINGULAIR) 10 MG tablet TAKE 1 TABLET BY MOUTH EVERY DAY 90 tablet 3   Multiple Vitamins-Minerals (ZINC PO) Take 1 tablet by mouth daily.     oxymetazoline (AFRIN) 0.05 % nasal spray 2 puffs twice daily x 5 days, off for 2 wks.     Probiotic Product (PROBIOTIC PO) Take 1 capsule by mouth daily.     Saline GEL as needed.     sodium chloride (OCEAN) 0.65 % SOLN nasal spray Place 1 spray into both nostrils as needed for congestion.     Tyrosine 500 MG CAPS Take 1,000 mg by mouth.     arginine 500 MG tablet Take 500 mg by mouth daily. (Patient not taking: Reported on 04/03/2022)     CITRULLINE PO Take 1,000 mg by mouth in the morning and at bedtime. (Patient not taking: Reported on 04/03/2022)     Grape Seed 100 MG CAPS Take 1 capsule by mouth daily. (Patient not taking: Reported on 04/03/2022)     No current  facility-administered medications on file prior to visit.   Past Medical History:  Diagnosis Date   Allergy    Bronchitis    Chronic sinusitis    Diabetes mellitus without complication ([place])    Migraines    Past Surgical History:  Procedure Laterality Date   MOUTH SURGERY     NASAL POLYP SURGERY    No Known Allergies  Family History  Problem Relation Age of Onset   Diabetes Father    Hyperlipidemia Mother    Hypertension Mother    Hypertension Brother    Heart disease Maternal Grandmother    Hyperlipidemia Maternal Grandfather    Hypertension Maternal Grandfather    Diabetes Paternal Grandmother    Heart disease Paternal Grandfather    Colon cancer Neg Hx    Esophageal cancer Neg Hx    Prostate cancer Neg Hx    Rectal cancer Neg Hx    Social History   Socioeconomic History   Marital status: Single    Spouse  name: Not on file   Number of children: Not on file   Years of education: Not on file   Highest education level: Not on file  Occupational History   Occupation: Customer Service    Employer: [employer]  Tobacco Use   Smoking status: Never   Smokeless tobacco: Never  Vaping Use   Vaping Use: Never used  Substance and Sexual Activity   Alcohol use: No   Drug use: No   Sexual activity: Not Currently  Other Topics Concern   Not on file  Social History Narrative   Not on file   Social Determinants of Health   Financial Resource Strain: Not on file  Food Insecurity: Not on file  Transportation Needs: Not on file  Physical Activity: Not on file  Stress: Not on file  Social Connections: Not on file   Vitals:   04/03/22 1525  BP: 128/80  Pulse: 92  Resp: 16  Temp: 98.5 F (36.9 C)  SpO2: 97%   Body mass index is 30.11 kg/m. Wt Readings from Last 3 Encounters:  04/03/22 192 lb 4 oz (87.2 kg)  10/04/21 192 lb 3.2 oz (87.2 kg)  09/26/21 188 lb 8 oz (85.5 kg)  Physical Exam Vitals and nursing note reviewed.  Constitutional:      General: He is not  in acute distress.    Appearance: He is well-developed.  HENT:     Head: Normocephalic and atraumatic.     Right Ear: Tympanic membrane, ear canal and external ear normal.     Left Ear: Tympanic membrane, ear canal and external ear normal.  Eyes:     Extraocular Movements: Extraocular movements intact.     Conjunctiva/sclera: Conjunctivae normal.     Pupils: Pupils are equal, round, and reactive to light.  Neck:     Thyroid: No thyromegaly.     Trachea: No tracheal deviation.  Cardiovascular:     Rate and Rhythm: Normal rate and regular rhythm.     Pulses:          Dorsalis pedis pulses are 2+ on the right side and 2+ on the left side.     Heart sounds: No murmur heard. Pulmonary:     Effort: Pulmonary effort is normal. No respiratory distress.     Breath sounds: Normal breath sounds.  Abdominal:     Palpations: Abdomen is soft. There is no hepatomegaly or mass.     Tenderness: There is no abdominal tenderness.  Genitourinary:    Comments: No concerns. Musculoskeletal:        General: No tenderness.     Cervical back: Normal range of motion.     Comments: No signs of synovitis.  Lymphadenopathy:     Cervical: No cervical adenopathy.     Upper Body:     Right upper body: No supraclavicular adenopathy.     Left upper body: No supraclavicular adenopathy.  Skin:    General: Skin is warm.     Findings: No erythema.  Neurological:     General: No focal deficit present.     Mental Status: He is alert and oriented to person, place, and time.     Cranial Nerves: No cranial nerve deficit.     Sensory: No sensory deficit.     Gait: Gait normal.     Deep Tendon Reflexes:     Reflex Scores:      Bicep reflexes are 2+ on the right side and 2+ on the left side.  Patellar reflexes are 2+ on the right side and 2+ on the left side. Psychiatric:        Mood and Affect: Mood and affect normal.   ASSESSMENT AND PLAN:  [name] was seen today for annual exam.  Diagnoses and all  orders for this visit:  Orders Placed This Encounter  Procedures   Varicella-zoster vaccine IM   Comprehensive metabolic panel   Microalbumin / creatinine urine ratio   Hemoglobin A1c   Lipid panel   Lab Results  Component Value Date   CHOL 128 04/03/2022   HDL 48.50 04/03/2022   LDLCALC 60 04/03/2022   TRIG 99.0 04/03/2022   CHOLHDL 3 04/03/2022   Lab Results  Component Value Date   HGBA1C 7.1 (H) 04/03/2022   Lab Results  Component Value Date   MICROALBUR 0.8 04/03/2022   MICROALBUR <0.7 03/29/2021   Lab Results  Component Value Date   CREATININE 0.83 04/03/2022   BUN 14 04/03/2022   NA 137 04/03/2022   K 3.7 04/03/2022   CL 99 04/03/2022   CO2 33 (H) 04/03/2022   Lab Results  Component Value Date   ALT 38 04/03/2022   AST 31 04/03/2022   ALKPHOS 56 04/03/2022   BILITOT 1.0 04/03/2022   Routine general medical examination at a health care facility Assessment & Plan: We discussed the importance of regular physical activity and healthy diet for prevention of chronic illness and/or complications. Preventive guidelines reviewed. Vaccination updated Next CPE in a year.   Type 2 diabetes mellitus with hyperglycemia, without long-term current use of insulin (HCC) Assessment & Plan: HgA1C at goal. Continue Metformin 500 mg Am and 1000 mg pm with meals. Annual eye exam, periodic dental and foot care recommended. F/U in 5-6 months  Orders: -     Comprehensive metabolic panel; Future -     Microalbumin / creatinine urine ratio; Future -     Hemoglobin A1c; Future  Atherosclerosis of aorta ([place]) Assessment & Plan: LDL 62 in 02/2021. Continue Atorvastatin 10 mg daily and low fat diet.  Orders: -     Comprehensive metabolic panel; Future -     Lipid panel; Future  Need for shingles vaccine -     Varicella-zoster vaccine IM  Return in 6 months (on 10/03/2022).  Mark Ward G. [place], MD  [facility]. [place] office.

## 2022-04-03 ENCOUNTER — Encounter: Payer: Self-pay | Admitting: Family Medicine

## 2022-04-03 ENCOUNTER — Ambulatory Visit (INDEPENDENT_AMBULATORY_CARE_PROVIDER_SITE_OTHER): Payer: PRIVATE HEALTH INSURANCE | Admitting: Family Medicine

## 2022-04-03 VITALS — BP 128/80 | HR 92 | Temp 98.5°F | Resp 16 | Ht 67.0 in | Wt 192.2 lb

## 2022-04-03 DIAGNOSIS — I700 Atherosclerosis of aorta: Secondary | ICD-10-CM

## 2022-04-03 DIAGNOSIS — E1165 Type 2 diabetes mellitus with hyperglycemia: Secondary | ICD-10-CM

## 2022-04-03 DIAGNOSIS — Z0000 Encounter for general adult medical examination without abnormal findings: Secondary | ICD-10-CM | POA: Diagnosis not present

## 2022-04-03 DIAGNOSIS — Z23 Encounter for immunization: Secondary | ICD-10-CM

## 2022-04-03 NOTE — Assessment & Plan Note (Addendum)
We discussed the importance of regular physical activity and healthy diet for prevention of chronic illness and/or complications. Preventive guidelines reviewed. Vaccination updated. Next CPE in a year. 

## 2022-04-03 NOTE — Patient Instructions (Addendum)
A few things to remember from today's visit:  Routine general medical examination at a health care facility  Type 2 diabetes mellitus with hyperglycemia, without long-term current use of insulin ([place]) - Plan: Comprehensive metabolic panel, Microalbumin / creatinine urine ratio, Hemoglobin A1c  Atherosclerosis of aorta (HCC) - Plan: Comprehensive metabolic panel, Lipid panel  If you need refills for medications you take chronically, please call your pharmacy. Do not use My Chart to request refills or for acute issues that need immediate attention. If you send a my chart message, it may take a few days to be addressed, specially if I am not in the office.  Please be sure medication list is accurate. If a new problem present, please set up appointment sooner than planned today.  Health Maintenance, Male Adopting a healthy lifestyle and getting preventive care are important in promoting health and wellness. Ask your health care provider about: The right schedule for you to have regular tests and exams. Things you can do on your own to prevent diseases and keep yourself healthy. What should I know about diet, weight, and exercise? Eat a healthy diet  Eat a diet that includes plenty of vegetables, fruits, low-fat dairy products, and lean protein. Do not eat a lot of foods that are high in solid fats, added sugars, or sodium. Maintain a healthy weight Body mass index (BMI) is a measurement that can be used to identify possible weight problems. It estimates body fat based on height and weight. Your health care provider can help determine your BMI and help you achieve or maintain a healthy weight. Get regular exercise Get regular exercise. This is one of the most important things you can do for your health. Most adults should: Exercise for at least 150 minutes each week. The exercise should increase your heart rate and make you sweat (moderate-intensity exercise). Do strengthening exercises at  least twice a week. This is in addition to the moderate-intensity exercise. Spend less time sitting. Even light physical activity can be beneficial. Watch cholesterol and blood lipids Have your blood tested for lipids and cholesterol at 54 years of age, then have this test every 5 years. You may need to have your cholesterol levels checked more often if: Your lipid or cholesterol levels are high. You are older than 54 years of age. You are at high risk for heart disease. What should I know about cancer screening? Many types of cancers can be detected early and may often be prevented. Depending on your health history and family history, you may need to have cancer screening at various ages. This may include screening for: Colorectal cancer. Prostate cancer. Skin cancer. Lung cancer. What should I know about heart disease, diabetes, and high blood pressure? Blood pressure and heart disease High blood pressure causes heart disease and increases the risk of stroke. This is more likely to develop in people who have high blood pressure readings or are overweight. Talk with your health care provider about your target blood pressure readings. Have your blood pressure checked: Every 3-5 years if you are 51-23 years of age. Every year if you are 42 years old or older. If you are between the ages of 43 and 57 and are a current or former smoker, ask your health care provider if you should have a one-time screening for abdominal aortic aneurysm (AAA). Diabetes Have regular diabetes screenings. This checks your fasting blood sugar level. Have the screening done: Once every three years after age 29 if you are  at a normal weight and have a low risk for diabetes. More often and at a younger age if you are overweight or have a high risk for diabetes. What should I know about preventing infection? Hepatitis B If you have a higher risk for hepatitis B, you should be screened for this virus. Talk with your  health care provider to find out if you are at risk for hepatitis B infection. Hepatitis C Blood testing is recommended for: Everyone born from [birth year] through 1963. Anyone with known risk factors for hepatitis C. Sexually transmitted infections (STIs) You should be screened each year for STIs, including gonorrhea and chlamydia, if: You are sexually active and are younger than 54 years of age. You are older than 54 years of age and your health care provider tells you that you are at risk for this type of infection. Your sexual activity has changed since you were last screened, and you are at increased risk for chlamydia or gonorrhea. Ask your health care provider if you are at risk. Ask your health care provider about whether you are at high risk for HIV. Your health care provider may recommend a prescription medicine to help prevent HIV infection. If you choose to take medicine to prevent HIV, you should first get tested for HIV. You should then be tested every 3 months for as long as you are taking the medicine. Follow these instructions at home: Alcohol use Do not drink alcohol if your health care provider tells you not to drink. If you drink alcohol: Limit how much you have to 0-2 drinks a day. Know how much alcohol is in your drink. In the [place] one drink equals one 12 oz bottle of beer (355 mL), one 5 oz glass of wine (148 mL), or one 1 oz glass of hard liquor (44 mL). Lifestyle Do not use any products that contain nicotine or tobacco. These products include cigarettes, chewing tobacco, and vaping devices, such as e-cigarettes. If you need help quitting, ask your health care provider. Do not use street drugs. Do not share needles. Ask your health care provider for help if you need support or information about quitting drugs. General instructions Schedule regular health, dental, and eye exams. Stay current with your vaccines. Tell your health care provider if: You often feel  depressed. You have ever been abused or do not feel safe at home. Summary Adopting a healthy lifestyle and getting preventive care are important in promoting health and wellness. Follow your health care provider's instructions about healthy diet, exercising, and getting tested or screened for diseases. Follow your health care provider's instructions on monitoring your cholesterol and blood pressure. This information is not intended to replace advice given to you by your health care provider. Make sure you discuss any questions you have with your health care provider. Document Revised: 09/05/2020 Document Reviewed: 09/05/2020 Elsevier Patient Education  [place].

## 2022-04-03 NOTE — Assessment & Plan Note (Signed)
LDL 62 in 02/2021. Continue Atorvastatin 10 mg daily and low fat diet.

## 2022-04-03 NOTE — Assessment & Plan Note (Signed)
HgA1C at goal. Continue Metformin 500 mg Am and 1000 mg pm with meals. Annual eye exam, periodic dental and foot care recommended. F/U in 5-6 months

## 2022-04-04 LAB — LIPID PANEL
Cholesterol: 128 mg/dL (ref 0–200)
HDL: 48.5 mg/dL (ref >39.00)
LDL Cholesterol: 60 mg/dL (ref 0–99)
NonHDL: 79.67
Total CHOL/HDL Ratio: 3
Triglycerides: 99 mg/dL (ref 0.0–149.0)
VLDL: 19.8 mg/dL (ref 0.0–40.0)

## 2022-04-04 LAB — COMPREHENSIVE METABOLIC PANEL
ALT: 38 U/L (ref 0–53)
AST: 31 U/L (ref 0–37)
Albumin: 4.6 g/dL (ref 3.5–5.2)
Alkaline Phosphatase: 56 U/L (ref 39–117)
BUN: 14 mg/dL (ref 6–23)
CO2: 33 mEq/L — ABNORMAL HIGH (ref 19–32)
Calcium: 9.3 mg/dL (ref 8.4–10.5)
Chloride: 99 mEq/L (ref 96–112)
Creatinine, Ser: 0.83 mg/dL (ref 0.40–1.50)
GFR: 99.19 mL/min (ref >60.00)
Glucose, Bld: 98 mg/dL (ref 70–99)
Potassium: 3.7 mEq/L (ref 3.5–5.1)
Sodium: 137 mEq/L (ref 135–145)
Total Bilirubin: 1 mg/dL (ref 0.2–1.2)
Total Protein: 7.5 g/dL (ref 6.0–8.3)

## 2022-04-04 LAB — MICROALBUMIN / CREATININE URINE RATIO
Creatinine,U: 35.4 mg/dL
Microalb Creat Ratio: 2.2 mg/g (ref 0.0–30.0)
Microalb, Ur: 0.8 mg/dL (ref 0.0–1.9)

## 2022-04-04 LAB — HEMOGLOBIN A1C: Hgb A1c MFr Bld: 7.1 % — ABNORMAL HIGH (ref 4.6–6.5)

## 2022-04-06 ENCOUNTER — Other Ambulatory Visit: Payer: Self-pay

## 2022-04-06 DIAGNOSIS — E1165 Type 2 diabetes mellitus with hyperglycemia: Secondary | ICD-10-CM

## 2022-04-06 MED ORDER — METFORMIN HCL 500 MG PO TABS: ORAL_TABLET | ORAL | 2 refills | Status: DC

## 2022-04-27 ENCOUNTER — Other Ambulatory Visit: Payer: Self-pay | Admitting: Internal Medicine

## 2022-05-27 ENCOUNTER — Other Ambulatory Visit: Payer: Self-pay | Admitting: Family Medicine

## 2022-08-01 ENCOUNTER — Telehealth: Payer: Self-pay | Admitting: Internal Medicine

## 2022-08-01 MED ORDER — BUDESONIDE-FORMOTEROL FUMARATE 80-4.5 MCG/ACT IN AERO: INHALATION_SPRAY | RESPIRATORY_TRACT | 3 refills | Status: DC

## 2022-08-01 NOTE — Telephone Encounter (Signed)
Spoke to pt and he stated the phamacy needed a prior auth for budesonide-formoterol (SYMBICORT) 80-4.5 MCG/ACT inhaler. So, I called the pharmacy and they just needed a new refill script. Refill sent to pharmacy. Nothing further needed. Informed pt as well pt verbalized understanding.

## 2022-08-01 NOTE — Telephone Encounter (Signed)
I spoke to pt but we got cut off. I called him back and had to [facility].

## 2022-10-01 ENCOUNTER — Encounter: Payer: Self-pay | Admitting: Internal Medicine

## 2022-10-03 ENCOUNTER — Ambulatory Visit: Payer: PRIVATE HEALTH INSURANCE | Admitting: Family Medicine

## 2022-10-08 ENCOUNTER — Ambulatory Visit: Payer: PRIVATE HEALTH INSURANCE | Admitting: Internal Medicine

## 2022-10-10 NOTE — Progress Notes (Unsigned)
HPI: Mark Ward is a 57 y.o. male, who is here today for chronic disease management.  Last seen on 04/03/22  Diabetes Mellitus II: Dx'ed in 04/2016. He reports that his blood sugar levels have been mostly under 120, with recent readings of 119 and 113.  He has made some dietary changes, including incorporating olive oil into his diet by adding it to lettuce and other foods. He is attempting to cut down on carbohydrates and sweets, though he still consumes some sweets occasionally.  His last eye exam was in March/2024. He continues to exercise at a nearby gym twice a week and maintains an active lifestyle at work.  He is on Metformin 500 mg 2 tabs bid. Negative for symptoms of hypoglycemia, polyuria, polydipsia, numbness extremities, foot ulcers/trauma  Lab Results  Component Value Date   HGBA1C 7.1 (H) 04/03/2022   Lab Results  Component Value Date   MICROALBUR 0.8 04/03/2022   Lab Results  Component Value Date   CHOL 128 04/03/2022   HDL 48.50 04/03/2022   LDLCALC 60 04/03/2022   TRIG 99.0 04/03/2022   CHOLHDL 3 04/03/2022   He is due for a colonoscopy, as his last one was in 2019. However, he has not scheduled the procedure yet due to financial constraints and personal debts.  He has not experienced any blood in the stool or changes in bowel habits and is considering postponing the colonoscopy for a year.   *** Review of Systems See other pertinent positives and negatives in HPI.  Current Outpatient Medications on File Prior to Visit  Medication Sig Dispense Refill   albuterol (PROAIR HFA) 108 (90 Base) MCG/ACT inhaler 2 puffs every 4 hours if needed 1 Inhaler 1   Aloe-Sodium Chloride (AYR SALINE NASAL GEL NA) As needed     Alpha-Lipoic Acid 100 MG TABS Take 1 tablet by mouth daily.     arginine 500 MG tablet Take 500 mg by mouth daily.     aspirin EC 81 MG tablet Take 81 mg by mouth daily. Swallow whole.     atorvastatin (LIPITOR) 10 MG tablet TAKE 1 TABLET  BY MOUTH EVERY DAY 90 tablet 3   budesonide-formoterol (SYMBICORT) 80-4.5 MCG/ACT inhaler INHALE 2 PUFFS INTO THE LUNGS TWICE A DAY 10.2 each 3   Cholecalciferol (VITAMIN D) 50 MCG (2000 UT) CAPS Take 1 capsule by mouth daily.     CITRULLINE PO Take 1,000 mg by mouth in the morning and at bedtime.     Cranberry-Milk Thistle (LIVER & KIDNEY CLEANSER PO) Take 2 capsules by mouth in the morning and at bedtime.     fluticasone (FLONASE) 50 MCG/ACT nasal spray Place 1 spray into both nostrils in the morning and at bedtime.     glucose blood (ONETOUCH VERIO) test strip Use as instructed 100 each 12   Grape Seed 100 MG CAPS Take 1 capsule by mouth daily.     guaifenesin (HUMIBID E) 400 MG TABS tablet Take 400 mg by mouth every 6 (six) hours as needed.     Insulin Pen Needle 31G X 5 MM MISC Pt is to check blood sugar before breakfast and before evening meal 100 each 3   Lancets (ONETOUCH ULTRASOFT) lancets Use as instructed 100 each 12   montelukast (SINGULAIR) 10 MG tablet TAKE 1 TABLET BY MOUTH EVERY DAY 90 tablet 3   Multiple Vitamins-Minerals (ZINC PO) Take 1 tablet by mouth daily.     oxymetazoline (AFRIN) 0.05 % nasal spray 2 puffs  twice daily x 5 days, off for 2 wks.     Probiotic Product (PROBIOTIC PO) Take 1 capsule by mouth daily.     Saline GEL as needed.     sodium chloride (OCEAN) 0.65 % SOLN nasal spray Place 1 spray into both nostrils as needed for congestion.     Tyrosine 500 MG CAPS Take 1,000 mg by mouth.     No current facility-administered medications on file prior to visit.    Past Medical History:  Diagnosis Date   Allergy    Bronchitis    Chronic sinusitis    Diabetes mellitus without complication (HCC)    Migraines    No Known Allergies  Social History   Socioeconomic History   Marital status: Single    Spouse name: Not on file   Number of children: Not on file   Years of education: Not on file   Highest education level: Not on file  Occupational History    Occupation: Customer Service    Employer: [employer]  Tobacco Use   Smoking status: Never   Smokeless tobacco: Never  Vaping Use   Vaping Use: Never used  Substance and Sexual Activity   Alcohol use: No   Drug use: No   Sexual activity: Not Currently  Other Topics Concern   Not on file  Social History Narrative   Not on file   Social Determinants of Health   Financial Resource Strain: Not on file  Food Insecurity: Not on file  Transportation Needs: Not on file  Physical Activity: Not on file  Stress: Not on file  Social Connections: Not on file    Vitals:   10/12/22 1549  BP: 128/80  Pulse: 90  Temp: 98.6 F (37 C)  SpO2: 98%   Body mass index is 30.09 kg/m.  Physical Exam Vitals and nursing note reviewed.  Constitutional:      General: He is not in acute distress.    Appearance: He is well-developed.  HENT:     Head: Normocephalic and atraumatic.     Mouth/Throat:     Mouth: Mucous membranes are moist.     Pharynx: Oropharynx is clear.  Eyes:     Conjunctiva/sclera: Conjunctivae normal.  Cardiovascular:     Rate and Rhythm: Normal rate and regular rhythm.     Pulses:          Posterior tibial pulses are 2+ on the right side and 2+ on the left side.     Heart sounds: No murmur heard. Pulmonary:     Effort: Pulmonary effort is normal. No respiratory distress.     Breath sounds: Normal breath sounds.  Abdominal:     Palpations: Abdomen is soft. There is no hepatomegaly or mass.     Tenderness: There is no abdominal tenderness.  Lymphadenopathy:     Cervical: No cervical adenopathy.  Skin:    General: Skin is warm.     Findings: No erythema or rash.  Neurological:     Mental Status: He is alert and oriented to person, place, and time.     Cranial Nerves: No cranial nerve deficit.     Gait: Gait normal.  Psychiatric:     Comments: Well groomed, good eye contact.     ASSESSMENT AND PLAN:  Kuzey was seen today for medical management of chronic  issues.  Diagnoses and all orders for this visit:  Type 2 diabetes mellitus with hyperglycemia, without long-term current use of insulin (HCC) -  POC HgB A1c -     metFORMIN (GLUCOPHAGE) 500 MG tablet; TAKE 2 TABLET BY MOUTH EVERY MORNING AND 2 TABLETS WITH DINNER  Atherosclerosis of aorta (HCC)    Orders Placed This Encounter  Procedures   POC HgB A1c   Lab Results  Component Value Date   HGBA1C 6.5 10/12/2022    No problem-specific Assessment & Plan notes found for this encounter.   Return in about 6 months (around 04/13/2023) for Labs, CPE.  Leisha Trinkle G. [place], MD  [facility] [place] office.

## 2022-10-12 ENCOUNTER — Ambulatory Visit (INDEPENDENT_AMBULATORY_CARE_PROVIDER_SITE_OTHER): Payer: PRIVATE HEALTH INSURANCE | Admitting: Family Medicine

## 2022-10-12 ENCOUNTER — Encounter: Payer: Self-pay | Admitting: Family Medicine

## 2022-10-12 VITALS — BP 128/80 | HR 90 | Temp 98.6°F | Resp 12 | Ht 67.0 in | Wt 192.1 lb

## 2022-10-12 DIAGNOSIS — E1169 Type 2 diabetes mellitus with other specified complication: Secondary | ICD-10-CM

## 2022-10-12 DIAGNOSIS — I700 Atherosclerosis of aorta: Secondary | ICD-10-CM

## 2022-10-12 DIAGNOSIS — Z7984 Long term (current) use of oral hypoglycemic drugs: Secondary | ICD-10-CM

## 2022-10-12 DIAGNOSIS — E1165 Type 2 diabetes mellitus with hyperglycemia: Secondary | ICD-10-CM

## 2022-10-12 LAB — POCT GLYCOSYLATED HEMOGLOBIN (HGB A1C): HbA1c, POC (controlled diabetic range): 6.5 % (ref 0.0–7.0)

## 2022-10-12 MED ORDER — METFORMIN HCL 500 MG PO TABS: ORAL_TABLET | ORAL | 2 refills | Status: DC

## 2022-10-12 NOTE — Assessment & Plan Note (Signed)
Seen on abdominal CT in 04/2020. Currently on atorvastatin 10 mg daily and Aspirin 81 mg daily. Last LDL 60 in 03/2022.

## 2022-10-12 NOTE — Patient Instructions (Signed)
A few things to remember from today's visit:  Type 2 diabetes mellitus with hyperglycemia, without long-term current use of insulin (HCC) - Plan: POC HgB A1c  Atherosclerosis of aorta (HCC) No changes today.  If you need refills for medications you take chronically, please call your pharmacy. Do not use My Chart to request refills or for acute issues that need immediate attention. If you send a my chart message, it may take a few days to be addressed, specially if I am not in the office.  Please be sure medication list is accurate. If a new problem present, please set up appointment sooner than planned today.

## 2022-10-12 NOTE — Assessment & Plan Note (Signed)
HgA1C is now at goal, it went from 7.1 to 6.5. Continue metformin 500 mg 2 tablets twice daily. Regular exercise and healthy diet with avoidance of added sugar food intake is an important part of treatment and recommended. Annual eye exam, periodic dental and foot care to continue. F/U in 5-6 months.

## 2022-10-14 ENCOUNTER — Encounter: Payer: Self-pay | Admitting: Family Medicine

## 2022-11-12 ENCOUNTER — Other Ambulatory Visit: Payer: Self-pay | Admitting: Internal Medicine

## 2022-12-09 NOTE — Progress Notes (Unsigned)
Subjective:    Patient ID: Mark Ward, male   DOB: 1965-07-05   MRN: [id number]  Brief patient profile:  12 yobm never smoker never significant allergies or asthma with new onset cough Feb 2014 variably improved then relapsed seen by ent x 3 by UC x3 2, x ER x 3 and finally admitted:   Admit date: 11/24/2012  Discharge date: 11/26/2012  Discharge Diagnoses:  Moderate persistent asthma with exacerbation   10/04/2021  f/u ov/Shaune Malacara re: asthma    maint on symbicort but out x one month  still on singulair/ no recent ent f/u Chief Complaint  Patient presents with   Follow-up    Pt states he has been doing okay since last visit and denies any real complaints.   Dyspnea:  Not limited by breathing from desired activities   Cough: none/ nasal co's feels some  better with otcs but not using flonase  - denies over using afrin/ neo Sleeping: fine bed is flat, 2 pillows SABA use: none while on symb 02: none   Rec Dulera 100  = Symbicort 80  either one is fine, not both, and ok  just 2 puffs in am and and leave off the pm doses as long as doing great and needing albuterol See calendar for specific medication instructions and bring it back for each and every office visit for every healthcare provider you see.  Without it,  you may not receive the best quality medical care that we feel you deserve. Separating the top medications from the bottom group is fundamental to providing you adequate care going forward.    See your ENT with all your sinus meds you are taking as soon as you can> 10/23/21 [place] rx mucoprin       12/10/2022 1y  f/u ov/Wessley Emert re: asthma  maint on symbicort 80 and singualir   Chief Complaint  Patient presents with   Follow-up    Breathing is doing well and no new co's.    Dyspnea:  joined gym/ 8 min treadmill / walks a half a mile to work Cough: none / feels nasal passages clear  Sleeping: flat bed with 2 pillows  SABA use: none    No obvious day to day or daytime  variability or assoc excess/ purulent sputum or mucus plugs or hemoptysis or cp or chest tightness, subjective wheeze or overt  hb symptoms.    Also denies any obvious fluctuation of symptoms with weather or environmental changes or other aggravating or alleviating factors except as outlined above   No unusual exposure hx or h/o childhood pna/ asthma or knowledge of premature birth.  Current Allergies, Complete Past Medical History, Past Surgical History, Family History, and Social History were reviewed in [employer] record.  ROS  The following are not active complaints unless bolded Hoarseness, sore throat, dysphagia, dental problems, itching, sneezing,  nasal congestion or discharge of excess mucus or purulent secretions, ear ache,   fever, chills, sweats, unintended wt loss or wt gain, classically pleuritic or exertional cp,  orthopnea pnd or arm/hand swelling  or leg swelling, presyncope, palpitations, abdominal pain, anorexia, nausea, vomiting, diarrhea  or change in bowel habits or change in bladder habits, change in stools or change in urine, dysuria, hematuria,  rash, arthralgias, visual complaints, headache, numbness, weakness or ataxia or problems with walking or coordination,  change in mood or  memory.        Current Meds  Medication Sig   albuterol (PROAIR HFA) 108 (  90 Base) MCG/ACT inhaler 2 puffs every 4 hours if needed   Aloe-Sodium Chloride (AYR SALINE NASAL GEL NA) As needed   Alpha-Lipoic Acid 100 MG TABS Take 1 tablet by mouth daily.   arginine 500 MG tablet Take 500 mg by mouth daily.   aspirin EC 81 MG tablet Take 81 mg by mouth daily. Swallow whole.   atorvastatin (LIPITOR) 10 MG tablet TAKE 1 TABLET BY MOUTH EVERY DAY   budesonide-formoterol (SYMBICORT) 80-4.5 MCG/ACT inhaler INHALE 2 PUFFS INTO THE LUNGS TWICE A DAY   Cholecalciferol (VITAMIN D) 50 MCG (2000 UT) CAPS Take 1 capsule by mouth daily.   CITRULLINE PO Take 1,000 mg by mouth in the  morning and at bedtime.   Cranberry-Milk Thistle (LIVER & KIDNEY CLEANSER PO) Take 2 capsules by mouth in the morning and at bedtime.   fluticasone (FLONASE) 50 MCG/ACT nasal spray Place 1 spray into both nostrils in the morning and at bedtime.   glucose blood (ONETOUCH VERIO) test strip Use as instructed   Grape Seed 100 MG CAPS Take 1 capsule by mouth daily.   guaifenesin (HUMIBID E) 400 MG TABS tablet Take 400 mg by mouth every 6 (six) hours as needed.   Insulin Pen Needle 31G X 5 MM MISC Pt is to check blood sugar before breakfast and before evening meal   Lancets (ONETOUCH ULTRASOFT) lancets Use as instructed   LYSINE ACETATE PO Take 1 tablet by mouth 3 (three) times daily.   metFORMIN (GLUCOPHAGE) 500 MG tablet TAKE 2 TABLET BY MOUTH EVERY MORNING AND 2 TABLETS WITH DINNER   montelukast (SINGULAIR) 10 MG tablet TAKE 1 TABLET BY MOUTH EVERY DAY   Multiple Vitamins-Minerals (ZINC PO) Take 1 tablet by mouth daily.   oxymetazoline (AFRIN) 0.05 % nasal spray 2 puffs twice daily x 5 days, off for 2 wks.   Probiotic Product (PROBIOTIC PO) Take 1 capsule by mouth daily.   Saline GEL as needed.   sodium chloride (OCEAN) 0.65 % SOLN nasal spray Place 1 spray into both nostrils as needed for congestion.   Tyrosine 500 MG CAPS Take 1,000 mg by mouth.              Objective:   Physical Exam   wts  12/10/2022     185  10/04/2021       192   04/05/2020    194 08/28/2019     192  02/27/2019   197 01/02/2013  212  > 02/03/2013  215 > 05/12/2013 211 > 06/24/2013  212>210 07/09/13 > 12/30/2013  213 > 01/21/2014  208 >208 02/05/2014 > 03/26/2014 211 > 05/25/2014 216>211 06/22/2014 > 212 03/18/2015 >  09/09/2015   215 >   212 12/12/2015 > 03/15/2016  212 > 05/25/2016   191 > 09/13/2016  182 > 06/24/2017   173 >  05/30/2018    166 > 08/28/2018  176      HEENT : Oropharynx  clear      Nasal turbinates mod edema  - no polyps or purulent d/c   NECK :  without  apparent JVD/ palpable Nodes/TM    LUNGS: no acc muscle  use,  Nl contour chest which is clear to A and P bilaterally without cough on insp or exp maneuvers   CV:  RRR  no s3 or murmur or increase in P2, and no edema   ABD:  soft and nontender with nl inspiratory excursion in the supine position. No bruits or organomegaly appreciated  MS:  Nl gait/ ext warm without deformities Or obvious joint restrictions  calf tenderness, cyanosis or clubbing    SKIN: warm and dry without lesions    NEURO:  alert, approp, nl sensorium with  no motor or cerebellar deficits apparent.       Assessment:

## 2022-12-10 ENCOUNTER — Encounter: Payer: Self-pay | Admitting: Internal Medicine

## 2022-12-10 ENCOUNTER — Ambulatory Visit: Payer: PRIVATE HEALTH INSURANCE | Admitting: Internal Medicine

## 2022-12-10 VITALS — BP 116/70 | HR 84 | Ht 67.0 in | Wt 185.4 lb

## 2022-12-10 DIAGNOSIS — J45909 Unspecified asthma, uncomplicated: Secondary | ICD-10-CM | POA: Diagnosis not present

## 2022-12-10 DIAGNOSIS — J310 Chronic rhinitis: Secondary | ICD-10-CM | POA: Diagnosis not present

## 2022-12-10 NOTE — Assessment & Plan Note (Signed)
Followed in Pulmonary clinic/ [facility]/ Alphia Behanna/ [place]  Onset 2014   Sinus CT 05/05/13 > Complete opacification of visualized portions of the frontal sinuses and ethmoid sinus air cells.  - rec ENT eval 05/06/2013 > surgery Jearld Fenton) August 04 2013 > improved - Singulair trial 03/26/2014 >>>  - Allergy profile 03/26/2014 > IgE 77 with neg RAST - referred back to Jearld Fenton' group  for epistaxis 05/25/2016   > referred bac 10/04/2020   10/23/21 [place] rx mucoprin    Advised on nasal hygiene and keeping up with f/u ENT appts         Each maintenance medication was reviewed in detail including emphasizing most importantly the difference between maintenance and prns and under what circumstances the prns are to be triggered using an action plan format where appropriate.  Total time for H and P, chart review, counseling, reviewing hfa/nasal device(s) and generating customized AVS unique to this office visit / same day charting = 20 min annual eval

## 2022-12-10 NOTE — Patient Instructions (Signed)
No change in medications   Please schedule a follow up visit in 12  months but call sooner if needed  

## 2022-12-10 NOTE — Assessment & Plan Note (Signed)
Onset 2014 with upper airway component  - PFT's 01/02/2013 wnl including fef 25-75 p am dulera 100 x 2 pffs  - FENO 09/13/2016  =   29 on symb 160 x 2 bid but has aodm so try symb 80 2bid  - Allergy profile 01/23/2018 >  Eos 0.1 /  IgE  188 RAST neg  - 10/04/2021  After extensive coaching inhaler device,  effectiveness =    90%  Continue symbicort 80or dulera 100 and if problems with cost try using the pm dose prn  All goals of chronic asthma control met including optimal function and elimination of symptoms with minimal need for rescue therapy.  Contingencies discussed in full including contacting this office immediately if not controlling the symptoms using the rule of two's.     >>> continue symbicort 80 2bid/ singulair 10 mg q pm and f/u yearly

## 2022-12-21 ENCOUNTER — Telehealth: Payer: Self-pay | Admitting: Internal Medicine

## 2022-12-21 NOTE — Telephone Encounter (Signed)
Pt returning missed call. 

## 2022-12-26 ENCOUNTER — Telehealth: Payer: Self-pay | Admitting: Internal Medicine

## 2022-12-26 DIAGNOSIS — J45909 Unspecified asthma, uncomplicated: Secondary | ICD-10-CM

## 2022-12-26 MED ORDER — BUDESONIDE-FORMOTEROL FUMARATE 80-4.5 MCG/ACT IN AERO: INHALATION_SPRAY | RESPIRATORY_TRACT | 5 refills | Status: DC

## 2022-12-26 NOTE — Telephone Encounter (Signed)
ATC x 1 LVM to let patient know medication refill has been sent to patient's pharmacy of choice . Nothing else further needed at this time.

## 2022-12-28 NOTE — Telephone Encounter (Signed)
Closing encounter, responded different message.

## 2023-03-05 ENCOUNTER — Encounter: Payer: Self-pay | Admitting: Gastroenterology

## 2023-04-12 ENCOUNTER — Encounter: Payer: Self-pay | Admitting: Family Medicine

## 2023-04-12 ENCOUNTER — Ambulatory Visit (INDEPENDENT_AMBULATORY_CARE_PROVIDER_SITE_OTHER): Payer: PRIVATE HEALTH INSURANCE | Admitting: Family Medicine

## 2023-04-12 VITALS — BP 136/80 | HR 100 | Resp 16 | Ht 67.0 in | Wt 181.2 lb

## 2023-04-12 DIAGNOSIS — Z7984 Long term (current) use of oral hypoglycemic drugs: Secondary | ICD-10-CM

## 2023-04-12 DIAGNOSIS — Z23 Encounter for immunization: Secondary | ICD-10-CM

## 2023-04-12 DIAGNOSIS — E1169 Type 2 diabetes mellitus with other specified complication: Secondary | ICD-10-CM | POA: Diagnosis not present

## 2023-04-12 DIAGNOSIS — I700 Atherosclerosis of aorta: Secondary | ICD-10-CM

## 2023-04-12 LAB — POCT GLYCOSYLATED HEMOGLOBIN (HGB A1C): HbA1c, POC (prediabetic range): 6.2 % (ref 5.7–6.4)

## 2023-04-12 MED ORDER — METFORMIN HCL 500 MG PO TABS: ORAL_TABLET | ORAL | 2 refills | Status: DC

## 2023-04-12 NOTE — Progress Notes (Signed)
HPI: [name] is a 57 y.o. male with a PMHx significant for atherosclerosis of aorta, asthma, and DM II here today for chronic disease management.  Last seen on 10/12/2022  Diabetes Mellitus II: Dx'ed in 04/2016. - Checking BG at home: He has been checking his blood sugar. Yesterday it was 115, and he says that is similar to his usual readings.  - Medications: Currently on metformin 1000 mg bid,  - Diet: States he has been trying to avoid carbs. He eats a mix of chicken and pork, and eats vegetables 2-3x per week.  - Exercise: He has been exercising twice per week, and also walks at work.  - eye exam: UTD. His last exam was in 06/2022.  - foot exam: 03/2022. - mentions he had numbness and tingling in his feet one time a few months ago.  - Negative for symptoms of hypoglycemia, polyuria, polydipsia, foot ulcers/trauma  Lab Results  Component Value Date   HGBA1C 6.5 04/12/2023   Lab Results  Component Value Date   MICROALBUR 0.8 04/03/2022   HLD: Currently on atorvastatin 10 mg daily.  He has a history of aortic atherosclerosis.   Side effects from medication: none Negative for chest pain, sob, palpitiations. Lab Results  Component Value Date   CHOL 128 04/03/2022   HDL 48.50 04/03/2022   LDLCALC 60 04/03/2022   TRIG 99.0 04/03/2022   CHOLHDL 3 04/03/2022   Asthma:  He follows with pulmonology. He last followed on 8/12.   Review of Systems  Constitutional:  Negative for activity change, appetite change and fever.  HENT:  Negative for nosebleeds, sore throat and trouble swallowing.   Eyes:  Negative for redness and visual disturbance.  Respiratory:  Negative for cough and wheezing.   Cardiovascular:  Negative for chest pain and leg swelling.  Gastrointestinal:  Negative for abdominal pain, nausea and vomiting.  Genitourinary:  Negative for decreased urine volume, dysuria and hematuria.  Neurological:  Negative for syncope, weakness and headaches.   Psychiatric/Behavioral:  Negative for confusion. The patient is not nervous/anxious.   See other pertinent positives and negatives in HPI.  Current Outpatient Medications on File Prior to Visit  Medication Sig Dispense Refill   albuterol (PROAIR HFA) 108 (90 Base) MCG/ACT inhaler 2 puffs every 4 hours if needed 1 Inhaler 1   Aloe-Sodium Chloride (AYR SALINE NASAL GEL NA) As needed     Alpha-Lipoic Acid 100 MG TABS Take 1 tablet by mouth daily.     arginine 500 MG tablet Take 500 mg by mouth daily.     aspirin EC 81 MG tablet Take 81 mg by mouth daily. Swallow whole.     atorvastatin (LIPITOR) 10 MG tablet TAKE 1 TABLET BY MOUTH EVERY DAY 90 tablet 3   budesonide-formoterol (SYMBICORT) 80-4.5 MCG/ACT inhaler INHALE 2 PUFFS INTO THE LUNGS TWICE A DAY 30.6 each 5   Cholecalciferol (VITAMIN D) 50 MCG (2000 UT) CAPS Take 1 capsule by mouth daily.     CITRULLINE PO Take 1,000 mg by mouth in the morning and at bedtime.     Cranberry-Milk Thistle (LIVER & KIDNEY CLEANSER PO) Take 2 capsules by mouth in the morning and at bedtime.     fluticasone (FLONASE) 50 MCG/ACT nasal spray Place 1 spray into both nostrils in the morning and at bedtime.     glucose blood (ONETOUCH VERIO) test strip Use as instructed 100 each 12   Grape Seed 100 MG CAPS Take 1 capsule by mouth daily.  guaifenesin (HUMIBID E) 400 MG TABS tablet Take 400 mg by mouth every 6 (six) hours as needed.     Insulin Pen Needle 31G X 5 MM MISC Pt is to check blood sugar before breakfast and before evening meal 100 each 3   Lancets (ONETOUCH ULTRASOFT) lancets Use as instructed 100 each 12   LYSINE ACETATE PO Take 1 tablet by mouth 3 (three) times daily.     metFORMIN (GLUCOPHAGE) 500 MG tablet TAKE 2 TABLET BY MOUTH EVERY MORNING AND 2 TABLETS WITH DINNER 360 tablet 2   montelukast (SINGULAIR) 10 MG tablet TAKE 1 TABLET BY MOUTH EVERY DAY 90 tablet 3   Multiple Vitamins-Minerals (ZINC PO) Take 1 tablet by mouth daily.     oxymetazoline  (AFRIN) 0.05 % nasal spray 2 puffs twice daily x 5 days, off for 2 wks.     Probiotic Product (PROBIOTIC PO) Take 1 capsule by mouth daily.     Saline GEL as needed.     sodium chloride (OCEAN) 0.65 % SOLN nasal spray Place 1 spray into both nostrils as needed for congestion.     Tyrosine 500 MG CAPS Take 1,000 mg by mouth.     No current facility-administered medications on file prior to visit.    Past Medical History:  Diagnosis Date   Allergy    Bronchitis    Chronic sinusitis    Diabetes mellitus without complication (HCC)    Migraines    No Known Allergies  Social History   Socioeconomic History   Marital status: Single    Spouse name: Not on file   Number of children: Not on file   Years of education: Not on file   Highest education level: Not on file  Occupational History   Occupation: Customer Service    Employer: [employer]  Tobacco Use   Smoking status: Never   Smokeless tobacco: Never  Vaping Use   Vaping status: Never Used  Substance and Sexual Activity   Alcohol use: No   Drug use: No   Sexual activity: Not Currently  Other Topics Concern   Not on file  Social History Narrative   Not on file   Social Drivers of Health   Financial Resource Strain: Not on file  Food Insecurity: Not on file  Transportation Needs: Not on file  Physical Activity: Not on file  Stress: Not on file  Social Connections: Not on file   Today's Vitals   04/12/23 1549  BP: 136/80  Pulse: 100  Resp: 16  SpO2: 97%  Weight: 181 lb 4 oz (82.2 kg)  Height: 5\' 7"  (1.702 m)   Body mass index is 28.39 kg/m.  Physical Exam Vitals and nursing note reviewed.  Constitutional:      General: He is not in acute distress.    Appearance: He is well-developed.  HENT:     Head: Normocephalic and atraumatic.  Eyes:     Conjunctiva/sclera: Conjunctivae normal.  Cardiovascular:     Rate and Rhythm: Normal rate and regular rhythm.     Pulses:          Dorsalis pedis pulses are 2+ on  the right side and 2+ on the left side.     Heart sounds: No murmur heard. Pulmonary:     Effort: Pulmonary effort is normal. No respiratory distress.     Breath sounds: Normal breath sounds.  Abdominal:     Palpations: Abdomen is soft. There is no hepatomegaly or mass.  Tenderness: There is no abdominal tenderness.  Musculoskeletal:     Right lower leg: No edema.     Left lower leg: No edema.  Lymphadenopathy:     Cervical: No cervical adenopathy.  Skin:    General: Skin is warm.     Findings: No erythema or rash.  Neurological:     General: No focal deficit present.     Mental Status: He is alert and oriented to person, place, and time.     Gait: Gait normal.  Psychiatric:        Mood and Affect: Mood and affect normal.   ASSESSMENT AND PLAN:  Mr. Leard was seen today for chronic disease management.   Lab Results  Component Value Date   HGBA1C 6.2 04/12/2023   Lab Results  Component Value Date   CHOL 129 04/12/2023   HDL 50 04/12/2023   LDLCALC 67 04/12/2023   TRIG 54 04/12/2023   CHOLHDL 2.6 04/12/2023   Lab Results  Component Value Date   NA 140 04/12/2023   CL 99 04/12/2023   K 3.6 04/12/2023   CO2 25 04/12/2023   BUN 11 04/12/2023   CREATININE 0.92 04/12/2023   EGFR 98 04/12/2023   CALCIUM 9.4 04/12/2023   ALBUMIN 4.6 04/12/2023   GLUCOSE 92 04/12/2023   Lab Results  Component Value Date   ALT 22 04/12/2023   AST 28 04/12/2023   ALKPHOS 73 04/12/2023   BILITOT 1.0 04/12/2023   Lab Results  Component Value Date   LABMICR WILL FOLLOW 04/12/2023   MICROALBUR 0.8 04/03/2022   MICROALBUR <0.7 03/29/2021   Type 2 diabetes mellitus with other specified complication, without long-term current use of insulin (HCC) -     POCT glycosylated hemoglobin (Hb A1C) -     Comprehensive metabolic panel; Future -     Microalbumin / creatinine urine ratio; Future -     metFORMIN HCl; TAKE 2 TABLET BY MOUTH EVERY MORNING AND 2 TABLETS WITH DINNER  Dispense:  360 tablet; Refill: 2  Atherosclerosis of aorta [facility]) Assessment & Plan: Seen on abdominal CT 05/10/20.  Last LDL 60. On Atorvastatin 10 mg daily and Aspirin 81 mg daily.  Orders: -     Lipid panel; Future  Need for influenza vaccination -     Flu vaccine trivalent PF, 6mos and older(Flulaval,Afluria,Fluarix,Fluzone)  Need for Tdap vaccination -     Tdap vaccine greater than or equal to 7yo IM  Return in about 6 months (around 10/11/2023) for CPE, chronic problems.  I, Suanne Marker, acting as a scribe for Burlon Centrella [place], MD., have documented all relevant documentation on the behalf of Yocheved Depner [place], MD, as directed by  Thea Holshouser [place], MD while in the presence of Wayman Hoard [place], MD.   I, Suanne Marker, have reviewed all documentation for this visit. The documentation on 04/12/23 for the exam, diagnosis, procedures, and orders are all accurate and complete.  Jaryn Rosko G. [place], MD  [facility]. [place] office.

## 2023-04-12 NOTE — Patient Instructions (Addendum)
A few things to remember from today's visit:  Type 2 diabetes mellitus with other specified complication, without long-term current use of insulin (HCC) - Plan: POC HgB A1c, Comprehensive metabolic panel, Microalbumin / creatinine urine ratio, Microalbumin / creatinine urine ratio, Comprehensive metabolic panel, metFORMIN (GLUCOPHAGE) 500 MG tablet  Atherosclerosis of aorta (HCC) - Plan: Lipid panel, Lipid panel  No changes today. I will see you back in 6 months. Schedule your colonoscopy.  If you need refills for medications you take chronically, please call your pharmacy. Do not use My Chart to request refills or for acute issues that need immediate attention. If you send a my chart message, it may take a few days to be addressed, specially if I am not in the office.  Please be sure medication list is accurate. If a new problem present, please set up appointment sooner than planned today.

## 2023-04-13 NOTE — Assessment & Plan Note (Signed)
Seen on abdominal CT 05/10/20.  Last LDL 60. On Atorvastatin 10 mg daily and Aspirin 81 mg daily.

## 2023-04-17 LAB — COMPREHENSIVE METABOLIC PANEL
ALT: 22 [IU]/L (ref 0–44)
AST: 28 [IU]/L (ref 0–40)
Albumin: 4.6 g/dL (ref 3.8–4.9)
Alkaline Phosphatase: 73 [IU]/L (ref 44–121)
BUN/Creatinine Ratio: 12 (ref 9–20)
BUN: 11 mg/dL (ref 6–24)
Bilirubin Total: 1 mg/dL (ref 0.0–1.2)
CO2: 25 mmol/L (ref 20–29)
Calcium: 9.4 mg/dL (ref 8.7–10.2)
Chloride: 99 mmol/L (ref 96–106)
Creatinine, Ser: 0.92 mg/dL (ref 0.76–1.27)
Globulin, Total: 2.6 g/dL (ref 1.5–4.5)
Glucose: 92 mg/dL (ref 70–99)
Potassium: 3.6 mmol/L (ref 3.5–5.2)
Sodium: 140 mmol/L (ref 134–144)
Total Protein: 7.2 g/dL (ref 6.0–8.5)
eGFR: 98 mL/min/{1.73_m2} (ref >59)

## 2023-04-17 LAB — MICROALBUMIN / CREATININE URINE RATIO
Creatinine, Urine: 41.3 mg/dL
Microalb/Creat Ratio: 32 mg/g{creat} — ABNORMAL HIGH (ref 0–29)
Microalbumin, Urine: 13.2 ug/mL

## 2023-04-17 LAB — LIPID PANEL
Chol/HDL Ratio: 2.6 {ratio} (ref 0.0–5.0)
Cholesterol, Total: 129 mg/dL (ref 100–199)
HDL: 50 mg/dL (ref >39)
LDL Chol Calc (NIH): 67 mg/dL (ref 0–99)
Triglycerides: 54 mg/dL (ref 0–149)
VLDL Cholesterol Cal: 12 mg/dL (ref 5–40)

## 2023-05-31 ENCOUNTER — Other Ambulatory Visit: Payer: Self-pay | Admitting: Family Medicine

## 2023-06-11 ENCOUNTER — Telehealth: Payer: Self-pay | Admitting: Family Medicine

## 2023-06-11 NOTE — Telephone Encounter (Signed)
Called pt and left vm letting him know he will need to contact the billing dept with any questions or concerns regarding his bill. I gave pt billing's number in msg.

## 2023-10-29 NOTE — Progress Notes (Addendum)
 [facility] Quality Team Note  Name: Kane Kusek Date of Birth: Jul 09, 1965 MRN: [id number] Date: 10/29/2023  [facility] Quality Team has reviewed this patient's chart, please see recommendations below:  [facility] Quality Other; (CHART REVIEWED, PATIENT NEEDS A1C, EGFR, AND URINE MICROALBUMIN/CREATININE RATIO COMPLETED FOR GAP CLOSURE.)   02/05/2024- CHART REVIEWED, NO UPDATE  04/09/2024- NO GSD, NO KED

## 2023-12-04 NOTE — Telephone Encounter (Signed)
 Orders only

## 2024-01-31 ENCOUNTER — Other Ambulatory Visit: Payer: Self-pay | Admitting: Internal Medicine

## 2024-01-31 DIAGNOSIS — J45909 Unspecified asthma, uncomplicated: Secondary | ICD-10-CM

## 2024-01-31 NOTE — Telephone Encounter (Signed)
 Copied from CRM [id number]. Topic: Clinical - Medication Refill >> Jan 31, 2024  4:39 PM Joesph PARAS wrote: Medication: budesonide -formoterol  (SYMBICORT ) 80-4.5 MCG/ACT inhaler  Has the patient contacted their pharmacy? Yes  This is the patient's preferred pharmacy:  [id number] [place], [place] - [address] [place] [address] Phone: [phone] Fax: [phone]  Is this the correct pharmacy for this prescription? Yes If no, delete pharmacy and type the correct one.   Has the prescription been filled recently? No  Is the patient out of the medication? Yes - has been out for three days  Has the patient been seen for an appointment in the last year OR does the patient have an upcoming appointment? Yes - Does not but will be scheduling.   Can we respond through MyChart? No  Agent: Please be advised that Rx refills may take up to 3 business days. We ask that you follow-up with your pharmacy.

## 2024-02-04 MED ORDER — BUDESONIDE-FORMOTEROL FUMARATE 80-4.5 MCG/ACT IN AERO: INHALATION_SPRAY | RESPIRATORY_TRACT | 0 refills | Status: DC

## 2024-02-04 NOTE — Telephone Encounter (Signed)
 Courtesy refill. Pt needs ov for further refills.

## 2024-03-25 ENCOUNTER — Ambulatory Visit: Payer: PRIVATE HEALTH INSURANCE | Admitting: Internal Medicine

## 2024-03-25 ENCOUNTER — Encounter: Payer: Self-pay | Admitting: Internal Medicine

## 2024-03-25 VITALS — BP 132/82 | HR 106 | Ht 67.0 in | Wt 185.0 lb

## 2024-03-25 DIAGNOSIS — J310 Chronic rhinitis: Secondary | ICD-10-CM

## 2024-03-25 DIAGNOSIS — J45909 Unspecified asthma, uncomplicated: Secondary | ICD-10-CM | POA: Diagnosis not present

## 2024-03-25 MED ORDER — ALBUTEROL SULFATE HFA 108 (90 BASE) MCG/ACT IN AERS: INHALATION_SPRAY | RESPIRATORY_TRACT | 1 refills | Status: AC

## 2024-03-25 NOTE — Progress Notes (Unsigned)
 Subjective:    Patient ID: Mark Ward, male   DOB: January 24, 1966   MRN: [id number]  Brief patient profile:  89 yobm never smoker never significant allergies or asthma with new onset cough Feb 2014 variably improved then relapsed seen by ent x 3 by UC x3 2, x ER x 3 and finally admitted:   Admit date: 11/24/2012  Discharge date: 11/26/2012  Discharge Diagnoses:  Moderate persistent asthma with exacerbation   See your ENT with all your sinus meds you are taking as soon as you can> 10/23/21 [place] rx mucoprin    12/10/2022 1y  f/u ov/[name] re: asthma  maint on symbicort  80 and singualir   Chief Complaint  Patient presents with   Follow-up    Breathing is doing well and no new co's.    Dyspnea:  joined gym/ 8 min treadmill / walks a half a mile to work Cough: none / feels nasal passages clear  Sleeping: flat bed with 2 pillows  SABA use: none   Rec No change in medications Please schedule a follow up visit in 12 months but call sooner if needed    03/25/2024  f/u ov/[name] re: asthma/ new noct cough  maint on symbicort  80 and singulair    Chief Complaint  Patient presents with   Follow-up   Dyspnea:  no change in ex tol  Cough: worse hs/ minal nasal congestion occ flonase  use/ no assoc excess/ purulent sputum o Sleeping: bed is flat with a couple of pillows   SABA use: not using   No obvious day to day or daytime variability or r mucus plugs or hemoptysis or cp or chest tightness, subjective wheeze or overt   hb symptoms.    Also denies any obvious fluctuation of symptoms with weather or environmental changes or other aggravating or alleviating factors except as outlined above   No unusual exposure hx or h/o childhood pna/ asthma or knowledge of premature birth.  Current Allergies, Complete Past Medical History, Past Surgical History, Family History, and Social History were reviewed in [employer] record.  ROS  The following are not active complaints  unless bolded Hoarseness, sore throat, dysphagia, dental problems, itching, sneezing,  nasal congestion or discharge of excess mucus or purulent secretions, ear ache,   fever, chills, sweats, unintended wt loss or wt gain, classically pleuritic or exertional cp,  orthopnea pnd or arm/hand swelling  or leg swelling, presyncope, palpitations, abdominal pain, anorexia, nausea, vomiting, diarrhea  or change in bowel habits or change in bladder habits, change in stools or change in urine, dysuria, hematuria,  rash, arthralgias, visual complaints, headache, numbness, weakness or ataxia or problems with walking or coordination,  change in mood or  memory.        Current Meds  Medication Sig   albuterol  (PROAIR  HFA) 108 (90 Base) MCG/ACT inhaler 2 puffs every 4 hours if needed   Aloe-Sodium Chloride  (AYR SALINE NASAL GEL NA) As needed   Alpha-Lipoic Acid 100 MG TABS Take 1 tablet by mouth daily.   arginine 500 MG tablet Take 500 mg by mouth daily.   aspirin EC 81 MG tablet Take 81 mg by mouth daily. Swallow whole.   atorvastatin  (LIPITOR) 10 MG tablet TAKE 1 TABLET BY MOUTH EVERY DAY   budesonide -formoterol  (SYMBICORT ) 80-4.5 MCG/ACT inhaler INHALE 2 PUFFS INTO THE LUNGS TWICE A DAY   Cholecalciferol (VITAMIN D) 50 MCG (2000 UT) CAPS Take 1 capsule by mouth daily.   CITRULLINE PO Take 1,000  mg by mouth in the morning and at bedtime.   Cranberry-Milk Thistle (LIVER & KIDNEY CLEANSER PO) Take 2 capsules by mouth in the morning and at bedtime.   fluticasone  (FLONASE ) 50 MCG/ACT nasal spray Place 1 spray into both nostrils in the morning and at bedtime.   glucose blood (ONETOUCH VERIO) test strip Use as instructed   guaifenesin  (HUMIBID E) 400 MG TABS tablet Take 400 mg by mouth every 6 (six) hours as needed.   Insulin  Pen Needle 31G X 5 MM MISC Pt is to check blood sugar before breakfast and before evening meal   Lancets (ONETOUCH ULTRASOFT) lancets Use as instructed   LYSINE ACETATE PO Take 1 tablet by  mouth 3 (three) times daily.   metFORMIN  (GLUCOPHAGE ) 500 MG tablet TAKE 2 TABLET BY MOUTH EVERY MORNING AND 2 TABLETS WITH DINNER   montelukast  (SINGULAIR ) 10 MG tablet TAKE 1 TABLET BY MOUTH EVERY DAY   Multiple Vitamins-Minerals (ZINC PO) Take 1 tablet by mouth daily.   oxymetazoline (AFRIN) 0.05 % nasal spray 2 puffs twice daily x 5 days, off for 2 wks.   Probiotic Product (PROBIOTIC PO) Take 1 capsule by mouth daily.   Saline GEL as needed.   sodium chloride  (OCEAN) 0.65 % SOLN nasal spray Place 1 spray into both nostrils as needed for congestion.   Tyrosine 500 MG CAPS Take 1,000 mg by mouth.          Past Medical History:  Diagnosis Date   Allergy     Bronchitis    Chronic sinusitis    Diabetes mellitus without complication (HCC)    Migraines      Objective:   Physical Exam   wts  03/25/2024    185   12/10/2022     185  10/04/2021       192   04/05/2020    194 08/28/2019     192  02/27/2019   197 01/02/2013  212  > 02/03/2013  215 > 05/12/2013 211 > 06/24/2013  212>210 07/09/13 > 12/30/2013  213 > 01/21/2014  208 >208 02/05/2014 > 03/26/2014 211 > 05/25/2014 216>211 06/22/2014 > 212 03/18/2015 >  09/09/2015   215 >   212 12/12/2015 > 03/15/2016  212 > 05/25/2016   191 > 09/13/2016  182 > 06/24/2017   173 >  05/30/2018    166 > 08/28/2018  176   Vital signs reviewed  03/25/2024  - Note at rest 02 sats  98% on RA   General appearance:    amb bm nad    HEENT : Oropharynx  clear      Nasal turbinates mild non specific edema s pallor or polyps    NECK :  without  apparent JVD/ palpable Nodes/TM    LUNGS: no acc muscle use,  Nl contour chest which is clear to A and P bilaterally without cough on insp or exp maneuvers   CV:  RRR  no s3 or murmur or increase in P2, and no edema   ABD:  soft and nontender   MS:  Gait nl  ext warm without deformities Or obvious joint restrictions  calf tenderness, cyanosis or clubbing    SKIN: warm and dry without lesions    NEURO:  alert, approp, nl  sensorium with  no motor or cerebellar deficits apparent.         Assessment:   Assessment & Plan Persistent intrinsic asthma without complication, unspecified asthma severity Never smoker with Onset 2014 with upper airway  component  - PFT's 01/02/2013 wnl including fef 25-75 p am dulera  100 x 2 pffs  - FENO 09/13/2016  =   29 on symb 160 x 2 bid but has aodm so try symb 80 2bid  - Allergy  profile 01/23/2018 >  Eos 0.1 /  IgE  188 RAST neg  - 10/04/2021  After extensive coaching inhaler device,  effectiveness =    90%  Continue symbicort  80 or dulera  100 and if problems with cost try using the pm dose prn  All goals of chronic asthma control met including optimal function and elimination of symptoms with minimal need for rescue therapy.  Contingencies discussed in full including contacting this office immediately if not controlling the symptoms using the rule of two's.        Chronic rhinitis with assoc upper airway cough syndrome Onset 2014   Sinus CT 05/05/13 > Complete opacification of visualized portions of the frontal sinuses and ethmoid sinus air cells.  - rec ENT eval 05/06/2013 > surgery Jamie) August 04 2013 > improved - Singulair  trial 03/26/2014 >>>  - Allergy  profile 03/26/2014 > IgE 77 with neg RAST - referred back to [facility]  for epistaxis 05/25/2016   > referred bac 10/04/2020   10/23/21 [place] rx mucoprin    >>> 03/25/2024 added 1st gen H1 blockers per guidelines  HS prn   >>> also reviewed approp use of nasal steroids with f/u ENT prn            Each maintenance medication was reviewed in detail including emphasizing most importantly the difference between maintenance and prns and under what circumstances the prns are to be triggered using an action plan format where appropriate.  Total time for H and P, chart review, counseling, reviewing hfa/nasal device(s) and generating customized AVS unique to this office visit / same day charting = 21 min          AVS  Patient Instructions  For drainage / throat tickle try take CHLORPHENIRAMINE  4 mg  may cause drowsiness so start with just a dose or two an hour before bedtime and see how you tolerate it before trying in daytime.   [facility] may be available to internet    Please schedule a follow up visit in 12  months but call sooner if needed       Ozell America, MD 03/26/2024

## 2024-03-25 NOTE — Patient Instructions (Addendum)
 For drainage / throat tickle try take CHLORPHENIRAMINE  4 mg  may cause drowsiness so start with just a dose or two an hour before bedtime and see how you tolerate it before trying in daytime.   Orth[facility]feet may be available to internet    Please schedule a follow up visit in 12  months but call sooner if needed

## 2024-03-26 NOTE — Assessment & Plan Note (Addendum)
 Never smoker with Onset 2014 with upper airway component  - PFT's 01/02/2013 wnl including fef 25-75 p am dulera  100 x 2 pffs  - FENO 09/13/2016  =   29 on symb 160 x 2 bid but has aodm so try symb 80 2bid  - Allergy  profile 01/23/2018 >  Eos 0.1 /  IgE  188 RAST neg  - 10/04/2021  After extensive coaching inhaler device,  effectiveness =    90%  Continue symbicort  80 or dulera  100 and if problems with cost try using the pm dose prn  All goals of chronic asthma control met including optimal function and elimination of symptoms with minimal need for rescue therapy.  Contingencies discussed in full including contacting this office immediately if not controlling the symptoms using the rule of two's.

## 2024-03-26 NOTE — Assessment & Plan Note (Addendum)
 Onset 2014   Sinus CT 05/05/13 > Complete opacification of visualized portions of the frontal sinuses and ethmoid sinus air cells.  - rec ENT eval 05/06/2013 > surgery Jamie) August 04 2013 > improved - Singulair  trial 03/26/2014 >>>  - Allergy  profile 03/26/2014 > IgE 77 with neg RAST - referred back to [facility]  for epistaxis 05/25/2016   > referred bac 10/04/2020   10/23/21 [place] rx mucoprin    >>> 03/25/2024 added 1st gen H1 blockers per guidelines  HS prn   >>> also reviewed approp use of nasal steroids with f/u ENT prn            Each maintenance medication was reviewed in detail including emphasizing most importantly the difference between maintenance and prns and under what circumstances the prns are to be triggered using an action plan format where appropriate.  Total time for H and P, chart review, counseling, reviewing hfa/nasal device(s) and generating customized AVS unique to this office visit / same day charting = 21 min

## 2024-04-01 ENCOUNTER — Telehealth: Payer: Self-pay

## 2024-04-01 ENCOUNTER — Other Ambulatory Visit: Payer: Self-pay

## 2024-04-01 MED ORDER — CHLORPHENIRAMINE MALEATE 4 MG PO TABS: ORAL_TABLET | ORAL | 0 refills | Status: AC

## 2024-04-01 NOTE — Telephone Encounter (Signed)
 Copied from CRM #[id number]. Topic: Clinical - Prescription Issue >> Mar 30, 2024  4:19 PM Lavanda D wrote: Reason for CRM: Patient is calling because he had 2 medications prescribed at his visit on 11/26 - The CHLORPHENIRAMINE  4 mg and the ProAir  Inhaler. He picked up the inhaler on Friday, but they did not have the CHLORPHENIRAMINE  4 mg.   Tried to reach out to patient VM box full    Rx has been sent to pharmacy.

## 2024-04-12 ENCOUNTER — Other Ambulatory Visit: Payer: Self-pay | Admitting: Family Medicine

## 2024-04-12 DIAGNOSIS — E1169 Type 2 diabetes mellitus with other specified complication: Secondary | ICD-10-CM

## 2024-05-10 ENCOUNTER — Other Ambulatory Visit: Payer: Self-pay | Admitting: Family Medicine

## 2024-05-10 DIAGNOSIS — E1169 Type 2 diabetes mellitus with other specified complication: Secondary | ICD-10-CM

## 2024-05-23 ENCOUNTER — Other Ambulatory Visit: Payer: Self-pay | Admitting: Family Medicine

## 2024-05-23 DIAGNOSIS — E1169 Type 2 diabetes mellitus with other specified complication: Secondary | ICD-10-CM

## 2024-05-25 ENCOUNTER — Ambulatory Visit: Payer: Self-pay | Admitting: Family Medicine

## 2024-05-25 DIAGNOSIS — E1169 Type 2 diabetes mellitus with other specified complication: Secondary | ICD-10-CM

## 2024-05-25 MED ORDER — METFORMIN HCL 500 MG PO TABS: ORAL_TABLET | ORAL | 0 refills | Status: DC

## 2024-05-25 NOTE — Telephone Encounter (Signed)
 FYI Only or Action Required?: FYI only for provider: appointment scheduled on 05/26/24.  Patient was last seen in primary care on 04/12/2023 by Mark, [name], MD.  Called Nurse Triage reporting Medication Refill and Blood Sugar Problem.  Symptoms began several days ago.  Interventions attempted: Prescription medications: metformin  and Rest, hydration, or home remedies.  Symptoms are: stable.  Triage Disposition: Home Care  Patient/caregiver understands and will follow disposition?: Yes     Message from Rea ORN sent at 05/25/2024  3:16 PM EST  Reason for Triage: Increased BG. Pt average BG is in the 120's but this weekend it was 200. Pt experienced lightheadedness with the BG elevated.  Pt needs a refill of metformin . He has been out since last Thursday. The rx had been refused twice due to the pt needing an appt. Pt would like to schedule future appt and then submit a request for enough meds to get him to his appt.      Reason for Disposition  Blood glucose 70-240 mg/dL (3.9 -86.6 mmol/L)  Answer Assessment - Initial Assessment Questions Pt called to make appt with PCP and request refill of metformin . Pt has been out of rx since Thursday, 05/21/24. Pt reports BGL is usually in 120s; 161 this morning via finger accucheck and in the 200s this weekend. Pt did experience dizziness this weekend but no current symptoms. Pt states symptoms were resolved with food. Appointment scheduled for evaluation. Patient agrees with plan of care, and will call back if anything changes, or if symptoms worsen.      1. BLOOD GLUCOSE: What is your blood glucose level?      This morning, 161; pt reports over the weekend, specifically Saturday, 05/23/24, BGL 200 and was experiencing dizziness   2. ONSET: When did you check the blood glucose?     This morning   3. USUAL RANGE: What is your glucose level usually? (e.Ward., usual fasting morning value, usual evening value)     Usually in  120s  4. KETONES: Do you check for ketones (urine or blood test strips)? If Yes, ask: What does the test show now?      N/a   5. TYPE 1 or 2:  Do you know what type of diabetes you have?  (e.Ward., Type 1, Type 2, Gestational; doesn't know)      Type II   6. INSULIN : Do you take insulin ? What type of insulin (s) do you use? What is the mode of delivery? (syringe, pen; injection or pump)?      No  7. DIABETES PILLS: Do you take any pills for your diabetes? If Yes, ask: Have you missed taking any pills recently?     Yes; metformin  two pills BID; pt has been out of rx since Thursday 05/21/24    8. OTHER SYMPTOMS: Do you have any symptoms? (e.Ward., fever, frequent urination, difficulty breathing, dizziness, weakness, vomiting)     No current symptoms; reports dizziness over the weekend that resolved with eating  Protocols used: Diabetes - High Blood Sugar-A-AH

## 2024-05-26 ENCOUNTER — Ambulatory Visit (INDEPENDENT_AMBULATORY_CARE_PROVIDER_SITE_OTHER): Payer: PRIVATE HEALTH INSURANCE | Admitting: Family Medicine

## 2024-05-26 VITALS — BP 138/88 | HR 107 | Temp 97.1°F | Resp 16 | Ht 67.0 in | Wt 186.4 lb

## 2024-05-26 DIAGNOSIS — E785 Hyperlipidemia, unspecified: Secondary | ICD-10-CM | POA: Diagnosis not present

## 2024-05-26 DIAGNOSIS — Z23 Encounter for immunization: Secondary | ICD-10-CM | POA: Diagnosis not present

## 2024-05-26 DIAGNOSIS — E1169 Type 2 diabetes mellitus with other specified complication: Secondary | ICD-10-CM

## 2024-05-26 DIAGNOSIS — I700 Atherosclerosis of aorta: Secondary | ICD-10-CM

## 2024-05-26 DIAGNOSIS — Z7984 Long term (current) use of oral hypoglycemic drugs: Secondary | ICD-10-CM

## 2024-05-26 LAB — POCT GLYCOSYLATED HEMOGLOBIN (HGB A1C): Hemoglobin A1C: 7.2 % — AB (ref 4.0–5.6)

## 2024-05-26 MED ORDER — ATORVASTATIN CALCIUM 10 MG PO TABS: 10.0000 mg | ORAL_TABLET | Freq: Every day | ORAL | 3 refills | Status: AC

## 2024-05-26 MED ORDER — DAPAGLIFLOZIN PROPANEDIOL 10 MG PO TABS: 10.0000 mg | ORAL_TABLET | Freq: Every day | ORAL | 1 refills | Status: DC

## 2024-05-26 MED ORDER — METFORMIN HCL 500 MG PO TABS: 1000.0000 mg | ORAL_TABLET | Freq: Two times a day (BID) | ORAL | 2 refills | Status: AC

## 2024-05-26 NOTE — Assessment & Plan Note (Signed)
 HgA1C went up from 6.2 to 7.2 today. Continue metformin  500 mg 2 tablets twice daily. We discussed other pharmacologic options and agrees with trying Farxiga  10 mg daily. We reviewed dietary recommendations, he has decrease sugar intake for the past 2 weeks. Annual eye exam, periodic dental and foot care recommended F/U in 4-5 months.

## 2024-05-26 NOTE — Progress Notes (Unsigned)
 "    Chief Complaint  Patient presents with   Medical Management of Chronic Issues   Diabetes   Discussed the use of AI scribe software for clinical note transcription with the patient, who gave verbal consent to proceed. History of Present Illness Mark Ward is a 59 year old male with a PMHx significant for atherosclerosis of aorta, asthma, and DM II here today for chronic disease management.  Last seen on 10/12/2022 No new problems since his last visit.  Since his last visit he has seen pulmonologist for his asthma.  Diabetes Mellitus II: Dx'ed in 04/2016. He has been experiencing fluctuations in blood sugar levels, with a recent spike to 200 mg/dL after consuming food from a deli.  His typical morning blood sugar is around 120 mg/dL, but it increased to 838 mg/dL two days after the deli incident.   He has been taking metformin  500 mg 2 tabs twice daily but has not taken it since last Thursday.   He engages in physical activity, such as yard work and going to [employer] twice a week.  He discusses his dietary habits, mentioning efforts to avoid sugar and eat healthier by preparing meals at home and avoiding candy for the past 2 weeks.  He has not felt feet numbness or tingling. Negative for symptoms of hypoglycemia, polyuria, polydipsia, foot ulcers/trauma  Lab Results  Component Value Date   HGBA1C 6.2 04/12/2023   HLD and aortic atherosclerosis: Currently on atorvastatin  10 mg, although he does not take it every day.  Lab Results  Component Value Date   CHOL 129 04/12/2023   HDL 50 04/12/2023   LDLCALC 67 04/12/2023   TRIG 54 04/12/2023   CHOLHDL 2.6 04/12/2023   Review of Systems  Constitutional:  Negative for activity change, appetite change and fever.  HENT:  Negative for mouth sores and sore throat.   Eyes:  Negative for redness and visual disturbance.  Respiratory:  Negative for cough, shortness of breath and wheezing.   Cardiovascular:  Negative for  chest pain, palpitations and leg swelling.  Gastrointestinal:  Negative for abdominal pain, nausea and vomiting.  Genitourinary:  Negative for decreased urine volume, dysuria and hematuria.  Skin:  Negative for rash.  Neurological:  Negative for syncope, weakness and headaches.  See other pertinent positives and negatives in HPI.  Current Outpatient Medications on File Prior to Visit  Medication Sig Dispense Refill   albuterol  (PROAIR  HFA) 108 (90 Base) MCG/ACT inhaler 2 puffs every 4 hours if needed 1 each 1   aspirin EC 81 MG tablet Take 81 mg by mouth daily. Swallow whole.     budesonide -formoterol  (SYMBICORT ) 80-4.5 MCG/ACT inhaler INHALE 2 PUFFS INTO THE LUNGS TWICE A DAY 30.6 each 0   Cholecalciferol (VITAMIN D) 50 MCG (2000 UT) CAPS Take 1 capsule by mouth daily.     fluticasone  (FLONASE ) 50 MCG/ACT nasal spray Place 1 spray into both nostrils in the morning and at bedtime.     glucose blood (ONETOUCH VERIO) test strip Use as instructed 100 each 12   Insulin  Pen Needle 31G X 5 MM MISC Pt is to check blood sugar before breakfast and before evening meal 100 each 3   Lancets (ONETOUCH ULTRASOFT) lancets Use as instructed 100 each 12   montelukast  (SINGULAIR ) 10 MG tablet TAKE 1 TABLET BY MOUTH EVERY DAY 90 tablet 3   Multiple Vitamins-Minerals (ZINC PO) Take 1 tablet by mouth daily.     Aloe-Sodium Chloride  (AYR SALINE  NASAL GEL NA) As needed (Patient not taking: Reported on 05/26/2024)     Alpha-Lipoic Acid 100 MG TABS Take 1 tablet by mouth daily. (Patient not taking: Reported on 05/26/2024)     chlorpheniramine  (CHLOR-TRIMETON ) 4 MG tablet start with just a dose or two an hour before bedtime and see how you tolerate it before trying in daytime. (Patient not taking: Reported on 05/26/2024) 14 tablet 0   oxymetazoline (AFRIN) 0.05 % nasal spray 2 puffs twice daily x 5 days, off for 2 wks. (Patient not taking: Reported on 05/26/2024)     No current facility-administered medications on file  prior to visit.    Past Medical History:  Diagnosis Date   Allergy     Bronchitis    Chronic sinusitis    Diabetes mellitus without complication (HCC)    Migraines    No Known Allergies  Social History   Socioeconomic History   Marital status: Single    Spouse name: Not on file   Number of children: Not on file   Years of education: Not on file   Highest education level: Bachelor's degree (e.g., BA, AB, BS)  Occupational History   Occupation: Nutritional Therapist: [employer]  Tobacco Use   Smoking status: Never   Smokeless tobacco: Never  Vaping Use   Vaping status: Never Used  Substance and Sexual Activity   Alcohol use: No   Drug use: No   Sexual activity: Not Currently  Other Topics Concern   Not on file  Social History Narrative   Not on file   Social Drivers of Health   Tobacco Use: Low Risk (05/26/2024)   Patient History    Smoking Tobacco Use: Never    Smokeless Tobacco Use: Never    Passive Exposure: Not on file  Financial Resource Strain: Medium Risk (05/26/2024)   Overall Financial Resource Strain (CARDIA)    Difficulty of Paying Living Expenses: Somewhat hard  Food Insecurity: No Food Insecurity (05/26/2024)   Epic    Worried About Radiation Protection Practitioner of Food in the Last Year: Never true    Ran Out of Food in the Last Year: Never true  Transportation Needs: No Transportation Needs (05/26/2024)   Epic    Lack of Transportation (Medical): No    Lack of Transportation (Non-Medical): No  Physical Activity: Insufficiently Active (05/26/2024)   Exercise Vital Sign    Days of Exercise per Week: 4 days    Minutes of Exercise per Session: 10 min  Stress: No Stress Concern Present (05/26/2024)   [employer] of Occupational Health - Occupational Stress Questionnaire    Feeling of Stress: Only a little  Social Connections: Moderately Isolated (05/26/2024)   Social Connection and Isolation Panel    Frequency of Communication with Friends and Family: Once a week     Frequency of Social Gatherings with Friends and Family: Once a week    Attends Religious Services: More than 4 times per year    Active Member of Clubs or Organizations: Yes    Attends Banker Meetings: More than 4 times per year    Marital Status: Never married  Depression (PHQ2-9): Low Risk (05/26/2024)   Depression (PHQ2-9)    PHQ-2 Score: 0  Alcohol Screen: Low Risk (05/26/2024)   Alcohol Screen    Last Alcohol Screening Score (AUDIT): 0  Housing: Low Risk (05/26/2024)   Epic    Unable to Pay for Housing in the Last Year: No    Number of  Times Moved in the Last Year: 0    Homeless in the Last Year: No  Utilities: Not on file  Health Literacy: Not on file   Today's Vitals   05/26/24 1611  BP: 138/88  Pulse: (!) 107  Resp: 16  Temp: (!) 97.1 F (36.2 C)  SpO2: 95%  Weight: 186 lb 6.4 oz (84.6 kg)  Height: 5' 7 (1.702 m)   Body mass index is 29.19 kg/m.  Physical Exam Vitals and nursing note reviewed.  Constitutional:      General: He is not in acute distress.    Appearance: He is well-developed.  HENT:     Head: Normocephalic and atraumatic.     Mouth/Throat:     Mouth: Mucous membranes are moist.     Pharynx: Oropharynx is clear.  Eyes:     Conjunctiva/sclera: Conjunctivae normal.  Cardiovascular:     Rate and Rhythm: Normal rate and regular rhythm.     Pulses:          Dorsalis pedis pulses are 2+ on the right side and 2+ on the left side.     Heart sounds: No murmur heard.    Comments: HR 96/min Pulmonary:     Effort: Pulmonary effort is normal. No respiratory distress.     Breath sounds: Normal breath sounds.  Abdominal:     Palpations: Abdomen is soft. There is no hepatomegaly or mass.     Tenderness: There is no abdominal tenderness.  Lymphadenopathy:     Cervical: No cervical adenopathy.  Skin:    General: Skin is warm.     Findings: No erythema or rash.  Neurological:     General: No focal deficit present.     Mental Status: He  is alert and oriented to person, place, and time.     Gait: Gait normal.  Psychiatric:        Mood and Affect: Mood and affect normal.   ASSESSMENT AND PLAN:  Mr. Finnan was seen today for chronic disease management.   Orders Placed This Encounter  Procedures   Pneumococcal conjugate vaccine 20-valent (Prevnar 20)   Comprehensive metabolic panel with GFR   Microalbumin / creatinine urine ratio   Lipid panel   POC HgB A1c    Lab Results  Component Value Date   HGBA1C 7.2 (A) 05/26/2024   Lab Results  Component Value Date   NA 139 05/26/2024   CL 101 05/26/2024   K 4.2 05/26/2024   CO2 31 05/26/2024   BUN 15 05/26/2024   CREATININE 0.86 05/26/2024   GFR 96.66 05/26/2024   CALCIUM  9.9 05/26/2024   ALBUMIN 4.6 05/26/2024   GLUCOSE 133 (H) 05/26/2024   Lab Results  Component Value Date   ALT 40 05/26/2024   AST 32 05/26/2024   ALKPHOS 67 05/26/2024   BILITOT 0.6 05/26/2024   Lab Results  Component Value Date   LABMICR 13.2 04/12/2023   MICROALBUR 2.0 (H) 05/26/2024   Lab Results  Component Value Date   CHOL 149 05/26/2024   HDL 44.20 05/26/2024   LDLCALC 74 05/26/2024   TRIG 158.0 (H) 05/26/2024   CHOLHDL 3 05/26/2024   Type 2 diabetes mellitus with other specified complication, without long-term current use of insulin  (HCC) Assessment & Plan: HgA1C went up from 6.2 to 7.2 today. Continue metformin  500 mg 2 tablets twice daily. We discussed other pharmacologic options and agrees with trying Farxiga  10 mg daily. We reviewed dietary recommendations, he has decrease sugar intake for  the past 2 weeks. Annual eye exam, periodic dental and foot care recommended F/U in 4-5 months.  Orders: -     POCT glycosylated hemoglobin (Hb A1C) -     Comprehensive metabolic panel with GFR; Future -     Microalbumin / creatinine urine ratio; Future -     Dapagliflozin  Propanediol; Take 1 tablet (10 mg total) by mouth daily.  Dispense: 90 tablet; Refill: 1 -     metFORMIN   HCl; Take 2 tablets (1,000 mg total) by mouth 2 (two) times daily with a meal. TAKE 2 TABLET BY MOUTH EVERY MORNING AND 2 TABLETS WITH DINNER  Dispense: 360 tablet; Refill: 2  Hyperlipidemia associated with type 2 diabetes mellitus (HCC) Assessment & Plan: Currently on Atorvastatin  10 mg daily. Continue low fat diet. Further recommendations according to lipid panel results.   Atherosclerosis of aorta Assessment & Plan: Seen on abdominal CT 05/10/20.  Last LDL 67 in 03/2023. On Atorvastatin  10 mg daily and Aspirin 81 mg daily.  Orders: -     Lipid panel; Future -     Atorvastatin  Calcium ; Take 1 tablet (10 mg total) by mouth at bedtime.  Dispense: 90 tablet; Refill: 3  Immunization due -     Pneumococcal conjugate vaccine 20-valent  Due for colon cancer screening. Last colonoscopy in 2019. He expresses concerns about insurance coverage for the procedure due to recent changes in his insurance plan. Declined referral.  Return in about 4 months (around 09/25/2024) for chronic problems.  Marionette Meskill G. Milayna Rotenberg, MD  [facility]. [place] [facility].   "

## 2024-05-26 NOTE — Assessment & Plan Note (Signed)
 Currently on Atorvastatin  10 mg daily. Continue low fat diet. Further recommendations according to lipid panel results.

## 2024-05-26 NOTE — Patient Instructions (Addendum)
 A few things to remember from today's visit:  Type 2 diabetes mellitus with other specified complication, without long-term current use of insulin  (HCC) - Plan: POC HgB A1c, Comprehensive metabolic panel with GFR, Microalbumin / creatinine urine ratio, Microalbumin / creatinine urine ratio, Comprehensive metabolic panel with GFR, dapagliflozin  propanediol (FARXIGA ) 10 MG TABS tablet  Immunization due - Plan: Pneumococcal conjugate vaccine 20-valent (Prevnar 20)  Atherosclerosis of aorta - Plan: Lipid panel, Lipid panel  Farxiga  added today to take before breakfast. Rest unchanged. Monitor blood pressure at home, goal is under 130/80.  If you need refills for medications you take chronically, please call your pharmacy. Do not use My Chart to request refills or for acute issues that need immediate attention. If you send a my chart message, it may take a few days to be addressed, specially if I am not in the office.  Please be sure medication list is accurate. If a new problem present, please set up appointment sooner than planned today.

## 2024-05-26 NOTE — Assessment & Plan Note (Signed)
 Seen on abdominal CT 05/10/20.  Last LDL 67 in 03/2023. On Atorvastatin  10 mg daily and Aspirin 81 mg daily.

## 2024-05-27 ENCOUNTER — Ambulatory Visit: Payer: Self-pay | Admitting: Family Medicine

## 2024-05-27 LAB — LIPID PANEL
Cholesterol: 149 mg/dL (ref 28–200)
HDL: 44.2 mg/dL
LDL Cholesterol: 74 mg/dL (ref 10–99)
NonHDL: 105.22
Total CHOL/HDL Ratio: 3
Triglycerides: 158 mg/dL — ABNORMAL HIGH (ref 10.0–149.0)
VLDL: 31.6 mg/dL (ref 0.0–40.0)

## 2024-05-27 LAB — MICROALBUMIN / CREATININE URINE RATIO
Creatinine,U: 116.3 mg/dL
Microalb Creat Ratio: 17.5 mg/g (ref 0.0–30.0)
Microalb, Ur: 2 mg/dL — ABNORMAL HIGH (ref 0.7–1.9)

## 2024-05-27 LAB — COMPREHENSIVE METABOLIC PANEL WITH GFR
ALT: 40 U/L (ref 3–53)
AST: 32 U/L (ref 5–37)
Albumin: 4.6 g/dL (ref 3.5–5.2)
Alkaline Phosphatase: 67 U/L (ref 39–117)
BUN: 15 mg/dL (ref 6–23)
CO2: 31 meq/L (ref 19–32)
Calcium: 9.9 mg/dL (ref 8.4–10.5)
Chloride: 101 meq/L (ref 96–112)
Creatinine, Ser: 0.86 mg/dL (ref 0.40–1.50)
GFR: 96.66 mL/min
Glucose, Bld: 133 mg/dL — ABNORMAL HIGH (ref 70–99)
Potassium: 4.2 meq/L (ref 3.5–5.1)
Sodium: 139 meq/L (ref 135–145)
Total Bilirubin: 0.6 mg/dL (ref 0.2–1.2)
Total Protein: 8 g/dL (ref 6.0–8.3)

## 2024-06-02 ENCOUNTER — Telehealth: Payer: Self-pay

## 2024-06-02 ENCOUNTER — Telehealth: Payer: Self-pay | Admitting: *Deleted

## 2024-06-02 DIAGNOSIS — J45909 Unspecified asthma, uncomplicated: Secondary | ICD-10-CM

## 2024-06-02 DIAGNOSIS — E1169 Type 2 diabetes mellitus with other specified complication: Secondary | ICD-10-CM

## 2024-06-02 MED ORDER — BUDESONIDE-FORMOTEROL FUMARATE 80-4.5 MCG/ACT IN AERO: INHALATION_SPRAY | RESPIRATORY_TRACT | 3 refills | Status: AC

## 2024-06-03 ENCOUNTER — Telehealth: Payer: Self-pay

## 2024-06-03 NOTE — Telephone Encounter (Signed)
 Unable to LVM - VM full. Called his house phone and informed him of his medication change.   He voiced understanding and agreed to trying trulicity  again. He did not have any further questions or concerns at his time.

## 2024-06-03 NOTE — Progress Notes (Unsigned)
 Care Guide Pharmacy Note  06/03/2024 Name: Mark Ward MRN: [id number] DOB: January 11, 1966  Referred By: [name], Betty G, MD Reason for referral: Call Attempt #1 and Complex Care Management (Outreach to sch ref w/ pharm)   Mark Ward is a 59 y.o. year old male who is a primary care patient of [name], Dickey MATSU, MD.  Mark Ward was referred to the pharmacist for assistance related to: DMII  An unsuccessful telephone outreach was attempted today to contact the patient who was referred to the pharmacy team for assistance with medication assistance. Additional attempts will be made to contact the patient.  Mark Ward [facility], [facility] Guide Direct Dial: [phone]  Fax: [phone]

## 2024-06-03 NOTE — Telephone Encounter (Signed)
 Please let pt know that Farxiga  is not covered by his health insurance. He took Trulicity  in the past, it seems like it may be more afforable, does he want to try? Thanks, BJ

## 2024-06-03 NOTE — Telephone Encounter (Signed)
 Applied coupon card for Farxiga , 30 day supply still costing patient $171 and not affordable.  Patient is requesting alternative if we can get something cheaper.   Unable to run test claims with his insurance to explore cheaper alternatives. Only way to know what an rx would cost him would be to send in an rx to pharmacy.  Chart review shows he had success with trulicity  in past, they have a coupon card I can try to apply for him.   Okay to send in order for trulicity  0.75mg  for patient to see if more cost effective?

## 2024-06-04 NOTE — Progress Notes (Signed)
 Care Guide Pharmacy Note  06/04/2024 Name: Mark Ward MRN: [id number] DOB: 1966-01-28  Referred By: [name], Betty G, MD Reason for referral: Call Attempt #1, Complex Care Management (Outreach to sch ref w/ pharm), and Call Attempt #2   Mark Ward is a 59 y.o. year old male who is a primary care patient of [name], Dickey MATSU, MD.  Mark Ward was referred to the pharmacist for assistance related to: DMII  A second unsuccessful telephone outreach was attempted today to contact the patient who was referred to the pharmacy team for assistance with medication assistance. Additional attempts will be made to contact the patient.  Mark Ward, Mark Ward: [phone]  Fax: [phone]

## 2024-06-04 NOTE — Progress Notes (Signed)
 Care Guide Pharmacy Note  06/04/2024 Name: Mark Ward MRN: [id number] DOB: Sep 02, 1965  Referred By: [name], Betty G, MD Reason for referral: Call Attempt #1, Complex Care Management (Outreach to sch ref w/ pharm), Call Attempt #2, and Call Attempt #3   Mark Ward is a 59 y.o. year old male who is a primary care patient of [name], Dickey MATSU, MD.  Mark Ward was referred to the pharmacist for assistance related to: DMII  Successful contact was made with the patient to discuss pharmacy services including being ready for the pharmacist to call at least 5 minutes before the scheduled appointment time and to have medication bottles and any blood pressure readings ready for review. The patient agreed to meet with the pharmacist via telephone visit on 06/17/24.  Mark Ward, Mark Ward: [phone]  Fax: [phone]

## 2024-06-05 ENCOUNTER — Other Ambulatory Visit: Payer: Self-pay | Admitting: Family Medicine

## 2024-06-05 ENCOUNTER — Telehealth: Payer: Self-pay

## 2024-06-05 ENCOUNTER — Other Ambulatory Visit (HOSPITAL_COMMUNITY): Payer: Self-pay

## 2024-06-05 DIAGNOSIS — E1169 Type 2 diabetes mellitus with other specified complication: Secondary | ICD-10-CM

## 2024-06-05 MED ORDER — TRULICITY 1.5 MG/0.5ML ~~LOC~~ SOAJ: 1.5000 mg | SUBCUTANEOUS | 1 refills | Status: AC

## 2024-06-05 MED ORDER — TRULICITY 0.75 MG/0.5ML ~~LOC~~ SOAJ: 0.7500 mg | SUBCUTANEOUS | 0 refills | Status: AC

## 2024-06-05 NOTE — Telephone Encounter (Signed)
Rx sent. ?BJ ?

## 2024-06-05 NOTE — Telephone Encounter (Signed)
 Pharmacy Patient Advocate Encounter   Received notification from Physician's Office that prior authorization for Trulicity  0.75MG /0.5ML auto-injectors is required/requested.   Insurance verification completed.   The patient is insured through [id number].   Per test claim: PA required; PA submitted to above mentioned insurance via Latent Key/confirmation #/EOC [id number] Status is pending

## 2024-06-17 ENCOUNTER — Other Ambulatory Visit: Payer: PRIVATE HEALTH INSURANCE
